# Patient Record
Sex: Female | Born: 1950 | Race: White | Hispanic: No | Marital: Married | Smoking: Never smoker
Health system: Southern US, Community
[De-identification: ages and names within clinical notes are randomized; demographics above are authoritative.]

## PROBLEM LIST (undated history)

## (undated) DIAGNOSIS — F419 Anxiety disorder, unspecified: Secondary | ICD-10-CM

## (undated) DIAGNOSIS — K219 Gastro-esophageal reflux disease without esophagitis: Secondary | ICD-10-CM

## (undated) DIAGNOSIS — D649 Anemia, unspecified: Secondary | ICD-10-CM

## (undated) DIAGNOSIS — K649 Unspecified hemorrhoids: Secondary | ICD-10-CM

## (undated) DIAGNOSIS — M8580 Other specified disorders of bone density and structure, unspecified site: Secondary | ICD-10-CM

## (undated) DIAGNOSIS — H269 Unspecified cataract: Secondary | ICD-10-CM

## (undated) DIAGNOSIS — E785 Hyperlipidemia, unspecified: Secondary | ICD-10-CM

## (undated) DIAGNOSIS — C801 Malignant (primary) neoplasm, unspecified: Secondary | ICD-10-CM

## (undated) DIAGNOSIS — F32A Depression, unspecified: Secondary | ICD-10-CM

## (undated) DIAGNOSIS — K2970 Gastritis, unspecified, without bleeding: Secondary | ICD-10-CM

## (undated) DIAGNOSIS — M1990 Unspecified osteoarthritis, unspecified site: Secondary | ICD-10-CM

## (undated) DIAGNOSIS — I10 Essential (primary) hypertension: Secondary | ICD-10-CM

## (undated) HISTORY — PX: CHOLECYSTECTOMY: SHX55

## (undated) HISTORY — DX: Gastro-esophageal reflux disease without esophagitis: K21.9

## (undated) HISTORY — PX: APPENDECTOMY: SHX54

## (undated) HISTORY — DX: Unspecified cataract: H26.9

## (undated) HISTORY — PX: DILATION AND CURETTAGE OF UTERUS: SHX78

## (undated) HISTORY — DX: Depression, unspecified: F32.A

## (undated) HISTORY — PX: CATARACT EXTRACTION W/ INTRAOCULAR LENS IMPLANT: SHX1309

## (undated) HISTORY — DX: Other specified disorders of bone density and structure, unspecified site: M85.80

## (undated) HISTORY — DX: Hyperlipidemia, unspecified: E78.5

---

## 1999-02-27 HISTORY — PX: ESOPHAGOGASTRODUODENOSCOPY: SHX1529

## 2001-05-23 ENCOUNTER — Ambulatory Visit (HOSPITAL_COMMUNITY): Admission: RE | Admit: 2001-05-23 | Discharge: 2001-05-23 | Payer: Self-pay | Admitting: *Deleted

## 2001-05-23 ENCOUNTER — Encounter: Payer: Self-pay | Admitting: *Deleted

## 2002-11-04 ENCOUNTER — Other Ambulatory Visit: Admission: RE | Admit: 2002-11-04 | Discharge: 2002-11-04 | Payer: Self-pay | Admitting: *Deleted

## 2003-12-06 ENCOUNTER — Other Ambulatory Visit: Admission: RE | Admit: 2003-12-06 | Discharge: 2003-12-06 | Payer: Self-pay | Admitting: Obstetrics and Gynecology

## 2010-07-19 ENCOUNTER — Encounter: Payer: Self-pay | Admitting: Gastroenterology

## 2010-07-19 HISTORY — PX: COLONOSCOPY: SHX174

## 2014-03-24 DIAGNOSIS — H40003 Preglaucoma, unspecified, bilateral: Secondary | ICD-10-CM | POA: Diagnosis not present

## 2014-05-20 DIAGNOSIS — Z1231 Encounter for screening mammogram for malignant neoplasm of breast: Secondary | ICD-10-CM | POA: Diagnosis not present

## 2014-05-20 DIAGNOSIS — Z6826 Body mass index (BMI) 26.0-26.9, adult: Secondary | ICD-10-CM | POA: Diagnosis not present

## 2014-05-20 DIAGNOSIS — Z01419 Encounter for gynecological examination (general) (routine) without abnormal findings: Secondary | ICD-10-CM | POA: Diagnosis not present

## 2014-05-20 DIAGNOSIS — Z124 Encounter for screening for malignant neoplasm of cervix: Secondary | ICD-10-CM | POA: Diagnosis not present

## 2014-06-25 DIAGNOSIS — M1711 Unilateral primary osteoarthritis, right knee: Secondary | ICD-10-CM | POA: Diagnosis not present

## 2014-06-25 DIAGNOSIS — M1712 Unilateral primary osteoarthritis, left knee: Secondary | ICD-10-CM | POA: Diagnosis not present

## 2014-09-07 DIAGNOSIS — Z01818 Encounter for other preprocedural examination: Secondary | ICD-10-CM | POA: Diagnosis not present

## 2014-09-07 DIAGNOSIS — Z9181 History of falling: Secondary | ICD-10-CM | POA: Diagnosis not present

## 2014-09-22 ENCOUNTER — Ambulatory Visit: Payer: Self-pay | Admitting: Orthopedic Surgery

## 2014-09-22 NOTE — Progress Notes (Signed)
Preoperative surgical orders have been place into the Epic hospital system for Brandy Cruz on 09/22/2014, 8:15 AM  by Patrica Duel for surgery on 10/18/2014.  Preop Total Knee orders including Experal, IV Tylenol, and IV Decadron as long as there are no contraindications to the above medications. Brandy Peace, PA-C

## 2014-09-27 DIAGNOSIS — D2239 Melanocytic nevi of other parts of face: Secondary | ICD-10-CM | POA: Diagnosis not present

## 2014-09-27 DIAGNOSIS — D485 Neoplasm of uncertain behavior of skin: Secondary | ICD-10-CM | POA: Diagnosis not present

## 2014-09-27 DIAGNOSIS — L728 Other follicular cysts of the skin and subcutaneous tissue: Secondary | ICD-10-CM | POA: Diagnosis not present

## 2014-09-28 ENCOUNTER — Ambulatory Visit: Payer: Self-pay | Admitting: Orthopedic Surgery

## 2014-09-28 NOTE — H&P (Signed)
Brandy Cruz DOB: [birth year] Married / Language: English / Race: White Female Date of Admission:  10/18/2014 CC:  Left Knee Pain History of Present Illness The patient is a 64 year old female who comes in for a preoperative History and Physical. The patient is scheduled for a left total knee arthroplasty to be performed by Dr. Gus Rankin. Aluisio, MD at [facility] on 10/18/2014. The patient is a 64 year old female who presents with knee complaints. The patient was seen for a second opinion. The patient reports left knee (worse than right) symptoms including: pain, swelling, stiffness (more on the left) and grinding which began year(s) ago without any known injury.The patient feels that the symptoms are worsening. The patient has the current diagnosis of knee osteoarthritis. Prior to being seen the patient was previously evaluated by a colleague. Previous work-up for this problem has included knee x-rays. Past treatment for this problem has included intra-articular injection of corticosteroids (last injections 04/20/14. She also had a round of Supartz injections about a year ago, but did not really get any relief from those). Current treatment includes application of ice, nonsteroidal anti-inflammatory drugs (Advil) and non-opioid analgesics (Tylenol, or topical analgesics). Unfortunately, her left knee is getting far worse than the right. She states it is bothering her at all times. It is something which she can and cannot do. She has had cortisone and viscosupplements without benefit. She was seen for a second opinion as to what further we can do with her knee to get her better. She does have some trouble with the right knee but nowhere near as bad as the left. She would like to get the left knee replaced at this time. They have been treated conservatively in the past for the above stated problem and despite conservative measures, they continue to have progressive pain and severe functional  limitations and dysfunction. They have failed non-operative management including home exercise, medications, and injections. It is felt that they would benefit from undergoing total joint replacement. Risks and benefits of the procedure have been discussed with the patient and they elect to proceed with surgery. There are no active contraindications to surgery such as ongoing infection or rapidly progressive neurological disease.  Problem List/Past Medical  Primary osteoarthritis of right knee (M17.11) High blood pressure Osteoarthritis Hyperlipidemia Mild Impaired Vision wears contacts Hemorrhoids occasional bleeding  Allergies Sulfabenzamide *CHEMICALS* Rash. Penicillamine *Assorted Classes** Rash. Childhood RXN  Family History Chronic Obstructive Lung Disease mother Congestive Heart Failure grandmother fathers side Heart Disease grandmother mothers side Cerebrovascular Accident grandfather fathers side Cancer mother and father Rheumatoid Arthritis mother Father Deceased, Prostate Cancer. age 26 Mother Deceased, Lung Cancer. 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 Ibuprofen (200MG  Tablet, Oral as needed) Active. Tylenol (Oral as needed) Specific dose unknown - Active. Juice Plus Fibre (Oral) Active.  Past Surgical History Appendectomy Date: 2009. Gallbladder Surgery Date: 2013. laporoscopic  Review of Systems  Constitutional: Negative.    HENT: Negative.   Cardiovascular: Negative.   Gastrointestinal: Negative.   Musculoskeletal: Positive for joint pain.  Neurological: Negative.    Vitals  Weight: 150 lb Height: 64in Weight was reported by patient. Height was reported by patient. Body Surface Area: 1.73 m Body Mass Index: 25.75 kg/m  BP: 148/78 (Sitting, Right Arm, Standard)  Physical Exam  General Mental Status -Alert, cooperative and good historian. General Appearance-pleasant, Not in acute distress. Orientation-Oriented X3. Build & Nutrition-Well nourished and Well developed.  Head and Neck Head-normocephalic, atraumatic . Neck Global Assessment - supple, no bruit auscultated on the right, no bruit auscultated on the left.  Eye Vision-Wears contact lenses. Pupil - Bilateral-Regular and Round. Motion - Bilateral-EOMI.  Chest and Lung Exam Auscultation Breath sounds - clear at anterior chest wall and clear at posterior chest wall. Adventitious sounds - No Adventitious sounds.  Cardiovascular Auscultation Rhythm - Regular rate and rhythm. Heart Sounds - S1 WNL and S2 WNL. Murmurs & Other Heart Sounds - Auscultation of the heart reveals - No Murmurs.  Abdomen Palpation/Percussion Tenderness - Abdomen is non-tender to palpation. Rigidity (guarding) - Abdomen is soft. Auscultation Auscultation of the abdomen reveals - Bowel sounds normal.  Female Genitourinary Note: Not done, not pertinent to present illness  Musculoskeletal Note: A well-developed female, alert and oriented, in no apparent distress. Her hips show normal range of motion with no discomfort. Both knees show no effusion. There is marked crepitus on range of motion, both knees. She has tenderness medial greater than lateral with no instability noted. Pulses, sensation and motor are intact to both lower extremities.  RADIOGRAPHS AP and lateral, both knees, show moderate to advanced arthritic change, medial and  patellofemoral compartments of both knees with bone on bone changes and osteophyte formation. The left knee is worse than the right.  Assessment & Plan Primary osteoarthritis of left knee (M17.12) Primary osteoarthritis of right knee (M17.11) Note:Surgical Plans: Left Total Knee Replacement  Disposition: Home with husband  PCP: Dr. Desmond Dike - Patient has been seen preoperatively and felt to be stable for surgery. "Considered low risk for complications if her preop labs and EKG are wnl. Rec standard post op DVT prophylaxsis. Please send me copy of those labs and EKG results."  IV TXA  Anesthesia Issues: None  Signed electronically by Lauraine Rinne, III PA-C

## 2014-09-30 DIAGNOSIS — H40003 Preglaucoma, unspecified, bilateral: Secondary | ICD-10-CM | POA: Diagnosis not present

## 2014-09-30 DIAGNOSIS — H2513 Age-related nuclear cataract, bilateral: Secondary | ICD-10-CM | POA: Diagnosis not present

## 2014-10-10 NOTE — Patient Instructions (Signed)
Brandy Cruz  10/10/2014   Your procedure is scheduled on:  October 18, 2014  Report to [facility] Main  Entrance take [place]  elevators to 3rd floor to  Short Stay Center at  8:35 AM.  Call this number if you have problems the morning of surgery [phone]   Remember: ONLY 1 PERSON MAY GO WITH YOU TO SHORT STAY TO GET  READY MORNING OF YOUR SURGERY.  Do not eat food or drink liquids :After Midnight.     Take these medicines the morning of surgery with A SIP OF WATER: None                                You may not have any metal on your body including hair pins and              piercings  Do not wear jewelry, make-up, lotions, powders or perfumes, deodorant             Do not wear nail polish.  Do not shave  48 hours prior to surgery.              Do not bring valuables to the hospital. [name] IS NOT             RESPONSIBLE   FOR VALUABLES.  Contacts, dentures or bridgework may not be worn into surgery.  Leave suitcase in the car. After surgery it may be brought to your room.      Special Instructions:  Coughing and deep breathing exercises              Please read over the following fact sheets you were given: _____________________________________________________________________             [facility] - Preparing for Surgery Before surgery, you can play an important role.  Because skin is not sterile, your skin needs to be as free of germs as possible.  You can reduce the number of germs on your skin by washing with CHG (chlorahexidine gluconate) soap before surgery.  CHG is an antiseptic cleaner which kills germs and bonds with the skin to continue killing germs even after washing. Please DO NOT use if you have an allergy to CHG or antibacterial soaps.  If your skin becomes reddened/irritated stop using the CHG and inform your nurse when you arrive at Short Stay. Do not shave (including legs and underarms) for at least 48 hours prior to the first  CHG shower.  You may shave your face/neck. Please follow these instructions carefully:  1.  Shower with CHG Soap the night before surgery and the  morning of Surgery.  2.  If you choose to wash your hair, wash your hair first as usual with your  normal  shampoo.  3.  After you shampoo, rinse your hair and body thoroughly to remove the  shampoo.                           4.  Use CHG as you would any other liquid soap.  You can apply chg directly  to the skin and wash                       Gently with a scrungie or clean washcloth.  5.  Apply  the CHG Soap to your body ONLY FROM THE NECK DOWN.   Do not use on face/ open                           Wound or open sores. Avoid contact with eyes, ears mouth and genitals (private parts).                       Wash face,  Genitals (private parts) with your normal soap.             6.  Wash thoroughly, paying special attention to the area where your surgery  will be performed.  7.  Thoroughly rinse your body with warm water from the neck down.  8.  DO NOT shower/wash with your normal soap after using and rinsing off  the CHG Soap.                9.  Pat yourself dry with a clean towel.            10.  Wear clean pajamas.            11.  Place clean sheets on your bed the night of your first shower and do not  sleep with pets. Day of Surgery : Do not apply any lotions/deodorants the morning of surgery.  Please wear clean clothes to the hospital/surgery center.  FAILURE TO FOLLOW THESE INSTRUCTIONS MAY RESULT IN THE CANCELLATION OF YOUR SURGERY PATIENT SIGNATURE_________________________________  NURSE SIGNATURE__________________________________  ________________________________________________________________________  WHAT IS A BLOOD TRANSFUSION? Blood Transfusion Information  A transfusion is the replacement of blood or some of its parts. Blood is made up of multiple cells which provide different functions.  Red blood cells carry oxygen and are used  for blood loss replacement.  White blood cells fight against infection.  Platelets control bleeding.  Plasma helps clot blood.  Other blood products are available for specialized needs, such as hemophilia or other clotting disorders. BEFORE THE TRANSFUSION  Who gives blood for transfusions?   Healthy volunteers who are fully evaluated to make sure their blood is safe. This is blood bank blood. Transfusion therapy is the safest it has ever been in the practice of medicine. Before blood is taken from a donor, a complete history is taken to make sure that person has no history of diseases nor engages in risky social behavior (examples are intravenous drug use or sexual activity with multiple partners). The donor's travel history is screened to minimize risk of transmitting infections, such as malaria. The donated blood is tested for signs of infectious diseases, such as HIV and hepatitis. The blood is then tested to be sure it is compatible with you in order to minimize the chance of a transfusion reaction. If you or a relative donates blood, this is often done in anticipation of surgery and is not appropriate for emergency situations. It takes many days to process the donated blood. RISKS AND COMPLICATIONS Although transfusion therapy is very safe and saves many lives, the main dangers of transfusion include:  1. Getting an infectious disease. 2. Developing a transfusion reaction. This is an allergic reaction to something in the blood you were given. Every precaution is taken to prevent this. The decision to have a blood transfusion has been considered carefully by your caregiver before blood is given. Blood is not given unless the benefits outweigh the risks. AFTER THE TRANSFUSION  Right after receiving a blood  transfusion, you will usually feel much better and more energetic. This is especially true if your red blood cells have gotten low (anemic). The transfusion raises the level of the red  blood cells which carry oxygen, and this usually causes an energy increase.  The nurse administering the transfusion will monitor you carefully for complications. HOME CARE INSTRUCTIONS  No special instructions are needed after a transfusion. You may find your energy is better. Speak with your caregiver about any limitations on activity for underlying diseases you may have. SEEK MEDICAL CARE IF:   Your condition is not improving after your transfusion.  You develop redness or irritation at the intravenous (IV) site. SEEK IMMEDIATE MEDICAL CARE IF:  Any of the following symptoms occur over the next 12 hours:  Shaking chills.  You have a temperature by mouth above 102 F (38.9 C), not controlled by medicine.  Chest, back, or muscle pain.  People around you feel you are not acting correctly or are confused.  Shortness of breath or difficulty breathing.  Dizziness and fainting.  You get a rash or develop hives.  You have a decrease in urine output.  Your urine turns a dark color or changes to pink, red, or brown. Any of the following symptoms occur over the next 10 days:  You have a temperature by mouth above 102 F (38.9 C), not controlled by medicine.  Shortness of breath.  Weakness after normal activity.  The white part of the eye turns yellow (jaundice).  You have a decrease in the amount of urine or are urinating less often.  Your urine turns a dark color or changes to pink, red, or brown. Document Released: 02/10/2000 Document Revised: 05/07/2011 Document Reviewed: 09/29/2007 ExitCare Patient Information 2014 [place], [place].  _______________________________________________________________________  Incentive Spirometer  An incentive spirometer is a tool that can help keep your lungs clear and active. This tool measures how well you are filling your lungs with each breath. Taking long deep breaths may help reverse or decrease the chance of developing breathing  (pulmonary) problems (especially infection) following:  A long period of time when you are unable to move or be active. BEFORE THE PROCEDURE   If the spirometer includes an indicator to show your best effort, your nurse or respiratory therapist will set it to a desired goal.  If possible, sit up straight or lean slightly forward. Try not to slouch.  Hold the incentive spirometer in an upright position. INSTRUCTIONS FOR USE  3. Sit on the edge of your bed if possible, or sit up as far as you can in bed or on a chair. 4. Hold the incentive spirometer in an upright position. 5. Breathe out normally. 6. Place the mouthpiece in your mouth and seal your lips tightly around it. 7. Breathe in slowly and as deeply as possible, raising the piston or the ball toward the top of the column. 8. Hold your breath for 3-5 seconds or for as long as possible. Allow the piston or ball to fall to the bottom of the column. 9. Remove the mouthpiece from your mouth and breathe out normally. 10. Rest for a few seconds and repeat Steps 1 through 7 at least 10 times every 1-2 hours when you are awake. Take your time and take a few normal breaths between deep breaths. 11. The spirometer may include an indicator to show your best effort. Use the indicator as a goal to work toward during each repetition. 12. After each set of 10 deep  breaths, practice coughing to be sure your lungs are clear. If you have an incision (the cut made at the time of surgery), support your incision when coughing by placing a pillow or rolled up towels firmly against it. Once you are able to get out of bed, walk around indoors and cough well. You may stop using the incentive spirometer when instructed by your caregiver.  RISKS AND COMPLICATIONS  Take your time so you do not get dizzy or light-headed.  If you are in pain, you may need to take or ask for pain medication before doing incentive spirometry. It is harder to take a deep breath if you  are having pain. AFTER USE  Rest and breathe slowly and easily.  It can be helpful to keep track of a log of your progress. Your caregiver can provide you with a simple table to help with this. If you are using the spirometer at home, follow these instructions: [place] IF:   You are having difficultly using the spirometer.  You have trouble using the spirometer as often as instructed.  Your pain medication is not giving enough relief while using the spirometer.  You develop fever of 100.5 F (38.1 C) or higher. SEEK IMMEDIATE MEDICAL CARE IF:   You cough up bloody sputum that had not been present before.  You develop fever of 102 F (38.9 C) or greater.  You develop worsening pain at or near the incision site. MAKE SURE YOU:   Understand these instructions.  Will watch your condition.  Will get help right away if you are not doing well or get worse. Document Released: 06/25/2006 Document Revised: 05/07/2011 Document Reviewed: 08/26/2006 [facility] Patient Information 2014 [place], [place].   ________________________________________________________________________

## 2014-10-12 ENCOUNTER — Encounter (HOSPITAL_COMMUNITY): Payer: Self-pay

## 2014-10-12 ENCOUNTER — Encounter (HOSPITAL_COMMUNITY)
Admission: RE | Admit: 2014-10-12 | Discharge: 2014-10-12 | Disposition: A | Discharge status: Home / Self Care | Payer: Medicare PPO | Source: Ambulatory Visit | Attending: Orthopedic Surgery | Admitting: Orthopedic Surgery

## 2014-10-12 DIAGNOSIS — M179 Osteoarthritis of knee, unspecified: Secondary | ICD-10-CM | POA: Insufficient documentation

## 2014-10-12 DIAGNOSIS — Z01818 Encounter for other preprocedural examination: Secondary | ICD-10-CM | POA: Diagnosis not present

## 2014-10-12 HISTORY — DX: Anemia, unspecified: D64.9

## 2014-10-12 HISTORY — DX: Anxiety disorder, unspecified: F41.9

## 2014-10-12 HISTORY — DX: Unspecified osteoarthritis, unspecified site: M19.90

## 2014-10-12 HISTORY — DX: Unspecified hemorrhoids: K64.9

## 2014-10-12 HISTORY — DX: Essential (primary) hypertension: I10

## 2014-10-12 LAB — CBC
HCT: 40.6 % (ref 36.0–46.0)
Hemoglobin: 13.9 g/dL (ref 12.0–15.0)
MCH: 32.3 pg (ref 26.0–34.0)
MCHC: 34.2 g/dL (ref 30.0–36.0)
MCV: 94.2 fL (ref 78.0–100.0)
Platelets: 252 10*3/uL (ref 150–400)
RBC: 4.31 MIL/uL (ref 3.87–5.11)
RDW: 12.2 % (ref 11.5–15.5)
WBC: 6.9 10*3/uL (ref 4.0–10.5)

## 2014-10-12 LAB — COMPREHENSIVE METABOLIC PANEL
ALT: 15 U/L (ref 14–54)
AST: 19 U/L (ref 15–41)
Albumin: 4.4 g/dL (ref 3.5–5.0)
Alkaline Phosphatase: 64 U/L (ref 38–126)
Anion gap: 9 (ref 5–15)
BUN: 7 mg/dL (ref 6–20)
CO2: 30 mmol/L (ref 22–32)
Calcium: 9.6 mg/dL (ref 8.9–10.3)
Chloride: 101 mmol/L (ref 101–111)
Creatinine, Ser: 0.69 mg/dL (ref 0.44–1.00)
GFR calc Af Amer: >60 mL/min (ref >60)
GFR calc non Af Amer: >60 mL/min (ref >60)
Glucose, Bld: 96 mg/dL (ref 65–99)
Potassium: 3.3 mmol/L — ABNORMAL LOW (ref 3.5–5.1)
Sodium: 140 mmol/L (ref 135–145)
Total Bilirubin: 0.7 mg/dL (ref 0.3–1.2)
Total Protein: 7.4 g/dL (ref 6.5–8.1)

## 2014-10-12 LAB — URINALYSIS, ROUTINE W REFLEX MICROSCOPIC
Bilirubin Urine: NEGATIVE
Glucose, UA: NEGATIVE mg/dL
Hgb urine dipstick: NEGATIVE
Ketones, ur: NEGATIVE mg/dL
Leukocytes, UA: NEGATIVE
Nitrite: NEGATIVE
Protein, ur: NEGATIVE mg/dL
Specific Gravity, Urine: 1.004 — ABNORMAL LOW (ref 1.005–1.030)
Urobilinogen, UA: 0.2 mg/dL (ref 0.0–1.0)
pH: 6 (ref 5.0–8.0)

## 2014-10-12 LAB — PROTIME-INR
INR: 1.01 (ref 0.00–1.49)
Prothrombin Time: 13.5 seconds (ref 11.6–15.2)

## 2014-10-12 LAB — SURGICAL PCR SCREEN
MRSA, PCR: NEGATIVE
Staphylococcus aureus: NEGATIVE

## 2014-10-12 LAB — APTT: aPTT: 30 seconds (ref 24–37)

## 2014-10-12 LAB — ABO/RH: ABO/RH(D): O POS

## 2014-10-12 NOTE — Progress Notes (Signed)
10-12-14 - LOV - Dr. Sheria Lang (Fam[facility]Med.) & surgical clearance - in chart

## 2014-10-18 ENCOUNTER — Encounter (HOSPITAL_COMMUNITY): Payer: Self-pay | Admitting: *Deleted

## 2014-10-18 ENCOUNTER — Inpatient Hospital Stay (HOSPITAL_COMMUNITY): Payer: Medicare PPO | Admitting: Certified Registered"

## 2014-10-18 ENCOUNTER — Inpatient Hospital Stay (HOSPITAL_COMMUNITY)
Admission: RE | Admit: 2014-10-18 | Discharge: 2014-10-20 | DRG: 470 | Disposition: A | Discharge status: Home Health | Payer: Medicare PPO | Source: Ambulatory Visit | Attending: Orthopedic Surgery | Admitting: Orthopedic Surgery

## 2014-10-18 ENCOUNTER — Encounter (HOSPITAL_COMMUNITY)
Admission: RE | Disposition: A | Discharge status: Home Health | Payer: Self-pay | Source: Ambulatory Visit | Attending: Orthopedic Surgery

## 2014-10-18 DIAGNOSIS — M1711 Unilateral primary osteoarthritis, right knee: Secondary | ICD-10-CM

## 2014-10-18 DIAGNOSIS — M25562 Pain in left knee: Secondary | ICD-10-CM | POA: Diagnosis present

## 2014-10-18 DIAGNOSIS — M170 Bilateral primary osteoarthritis of knee: Secondary | ICD-10-CM | POA: Diagnosis not present

## 2014-10-18 DIAGNOSIS — M1710 Unilateral primary osteoarthritis, unspecified knee: Secondary | ICD-10-CM | POA: Diagnosis present

## 2014-10-18 DIAGNOSIS — Z01812 Encounter for preprocedural laboratory examination: Secondary | ICD-10-CM | POA: Diagnosis not present

## 2014-10-18 DIAGNOSIS — M179 Osteoarthritis of knee, unspecified: Secondary | ICD-10-CM | POA: Diagnosis present

## 2014-10-18 DIAGNOSIS — Z79899 Other long term (current) drug therapy: Secondary | ICD-10-CM | POA: Diagnosis not present

## 2014-10-18 DIAGNOSIS — I10 Essential (primary) hypertension: Secondary | ICD-10-CM | POA: Diagnosis not present

## 2014-10-18 DIAGNOSIS — E785 Hyperlipidemia, unspecified: Secondary | ICD-10-CM | POA: Diagnosis present

## 2014-10-18 DIAGNOSIS — M1712 Unilateral primary osteoarthritis, left knee: Secondary | ICD-10-CM | POA: Diagnosis not present

## 2014-10-18 DIAGNOSIS — Z8249 Family history of ischemic heart disease and other diseases of the circulatory system: Secondary | ICD-10-CM

## 2014-10-18 DIAGNOSIS — Z8261 Family history of arthritis: Secondary | ICD-10-CM

## 2014-10-18 HISTORY — DX: Unilateral primary osteoarthritis, right knee: M17.11

## 2014-10-18 HISTORY — PX: TOTAL KNEE ARTHROPLASTY: SHX125

## 2014-10-18 HISTORY — DX: Osteoarthritis of knee, unspecified: M17.9

## 2014-10-18 HISTORY — DX: Unilateral primary osteoarthritis, unspecified knee: M17.10

## 2014-10-18 LAB — TYPE AND SCREEN
ABO/RH(D): O POS
Antibody Screen: NEGATIVE

## 2014-10-18 SURGERY — ARTHROPLASTY, KNEE, TOTAL
Anesthesia: Spinal | Site: Knee | Laterality: Left

## 2014-10-18 MED ORDER — CEFAZOLIN SODIUM-DEXTROSE 2-3 GM-% IV SOLR
INTRAVENOUS | Status: AC
  Filled 2014-10-18: qty 50

## 2014-10-18 MED ORDER — MIDAZOLAM HCL 2 MG/2ML IJ SOLN
INTRAMUSCULAR | Status: AC
  Filled 2014-10-18: qty 4

## 2014-10-18 MED ORDER — MIDAZOLAM HCL 5 MG/5ML IJ SOLN
INTRAMUSCULAR | Status: DC | PRN
  Administered 2014-10-18: 2 mg via INTRAVENOUS

## 2014-10-18 MED ORDER — KCL IN DEXTROSE-NACL 20-5-0.9 MEQ/L-%-% IV SOLN
INTRAVENOUS | Status: DC
  Administered 2014-10-18: 16:00:00 via INTRAVENOUS
  Filled 2014-10-18 (×5): qty 1000

## 2014-10-18 MED ORDER — CHLORHEXIDINE GLUCONATE 4 % EX LIQD: 60.0000 mL | Freq: Once | CUTANEOUS | Status: DC

## 2014-10-18 MED ORDER — DEXAMETHASONE SODIUM PHOSPHATE 10 MG/ML IJ SOLN
10.0000 mg | Freq: Once | INTRAMUSCULAR | Status: AC
  Administered 2014-10-19: 10 mg via INTRAVENOUS
  Filled 2014-10-18: qty 1

## 2014-10-18 MED ORDER — ACETAMINOPHEN 10 MG/ML IV SOLN
1000.0000 mg | Freq: Once | INTRAVENOUS | Status: AC
  Administered 2014-10-18: 1000 mg via INTRAVENOUS
  Filled 2014-10-18: qty 100

## 2014-10-18 MED ORDER — BUPIVACAINE LIPOSOME 1.3 % IJ SUSP
INTRAMUSCULAR | Status: DC | PRN
  Administered 2014-10-18: 20 mL

## 2014-10-18 MED ORDER — DOCUSATE SODIUM 100 MG PO CAPS
100.0000 mg | ORAL_CAPSULE | Freq: Two times a day (BID) | ORAL | Status: DC
  Administered 2014-10-18 – 2014-10-20 (×4): 100 mg via ORAL

## 2014-10-18 MED ORDER — DIPHENHYDRAMINE HCL 12.5 MG/5ML PO ELIX: 12.5000 mg | ORAL_SOLUTION | ORAL | Status: DC | PRN

## 2014-10-18 MED ORDER — LACTATED RINGERS IV SOLN
INTRAVENOUS | Status: AC
  Administered 2014-10-18: 1000 mL via INTRAVENOUS
  Administered 2014-10-18: 13:00:00 via INTRAVENOUS

## 2014-10-18 MED ORDER — OXYCODONE HCL 5 MG PO TABS
5.0000 mg | ORAL_TABLET | ORAL | Status: DC | PRN
  Administered 2014-10-18 – 2014-10-20 (×8): 10 mg via ORAL
  Filled 2014-10-18 (×8): qty 2

## 2014-10-18 MED ORDER — FENTANYL CITRATE (PF) 100 MCG/2ML IJ SOLN
INTRAMUSCULAR | Status: AC
  Filled 2014-10-18: qty 4

## 2014-10-18 MED ORDER — TRAMADOL HCL 50 MG PO TABS
50.0000 mg | ORAL_TABLET | Freq: Four times a day (QID) | ORAL | Status: DC | PRN
  Administered 2014-10-19: 50 mg via ORAL
  Filled 2014-10-18: qty 1

## 2014-10-18 MED ORDER — ONDANSETRON HCL 4 MG/2ML IJ SOLN
INTRAMUSCULAR | Status: DC | PRN
  Administered 2014-10-18: 4 mg via INTRAVENOUS

## 2014-10-18 MED ORDER — TRANEXAMIC ACID 1000 MG/10ML IV SOLN
1000.0000 mg | INTRAVENOUS | Status: AC
  Administered 2014-10-18: 1000 mg via INTRAVENOUS
  Filled 2014-10-18: qty 10

## 2014-10-18 MED ORDER — LIDOCAINE HCL (CARDIAC) 20 MG/ML IV SOLN
INTRAVENOUS | Status: AC
  Filled 2014-10-18: qty 5

## 2014-10-18 MED ORDER — METHOCARBAMOL 1000 MG/10ML IJ SOLN
500.0000 mg | Freq: Four times a day (QID) | INTRAVENOUS | Status: DC | PRN
  Administered 2014-10-19: 500 mg via INTRAVENOUS
  Filled 2014-10-18 (×2): qty 5

## 2014-10-18 MED ORDER — DEXAMETHASONE SODIUM PHOSPHATE 10 MG/ML IJ SOLN
INTRAMUSCULAR | Status: AC
Start: 2014-10-18 — End: 2014-10-18
  Filled 2014-10-18: qty 1

## 2014-10-18 MED ORDER — RIVAROXABAN 10 MG PO TABS
10.0000 mg | ORAL_TABLET | Freq: Every day | ORAL | Status: DC
Start: 2014-10-19 — End: 2014-10-20
  Administered 2014-10-19 – 2014-10-20 (×2): 10 mg via ORAL
  Filled 2014-10-18 (×3): qty 1

## 2014-10-18 MED ORDER — SODIUM CHLORIDE 0.9 % IV SOLN: INTRAVENOUS | Status: DC

## 2014-10-18 MED ORDER — ONDANSETRON HCL 4 MG/2ML IJ SOLN: 4.0000 mg | Freq: Four times a day (QID) | INTRAMUSCULAR | Status: DC | PRN

## 2014-10-18 MED ORDER — MORPHINE SULFATE (PF) 2 MG/ML IV SOLN
1.0000 mg | INTRAVENOUS | Status: DC | PRN
  Administered 2014-10-18: 2 mg via INTRAVENOUS
  Administered 2014-10-19: 1 mg via INTRAVENOUS
  Filled 2014-10-18 (×2): qty 1

## 2014-10-18 MED ORDER — POLYETHYLENE GLYCOL 3350 17 G PO PACK: 17.0000 g | PACK | Freq: Every day | ORAL | Status: DC | PRN

## 2014-10-18 MED ORDER — BISACODYL 10 MG RE SUPP: 10.0000 mg | Freq: Every day | RECTAL | Status: DC | PRN

## 2014-10-18 MED ORDER — PROPOFOL 10 MG/ML IV BOLUS
INTRAVENOUS | Status: AC
  Filled 2014-10-18: qty 20

## 2014-10-18 MED ORDER — PROPOFOL INFUSION 10 MG/ML OPTIME
INTRAVENOUS | Status: DC | PRN
  Administered 2014-10-18: 100 ug/kg/min via INTRAVENOUS

## 2014-10-18 MED ORDER — VANCOMYCIN HCL IN DEXTROSE 1-5 GM/200ML-% IV SOLN
1000.0000 mg | Freq: Two times a day (BID) | INTRAVENOUS | Status: AC
  Administered 2014-10-18: 1000 mg via INTRAVENOUS
  Filled 2014-10-18: qty 200

## 2014-10-18 MED ORDER — HYDROCHLOROTHIAZIDE 12.5 MG PO CAPS
12.5000 mg | ORAL_CAPSULE | Freq: Every day | ORAL | Status: DC
  Administered 2014-10-18 – 2014-10-20 (×3): 12.5 mg via ORAL
  Filled 2014-10-18 (×3): qty 1

## 2014-10-18 MED ORDER — SODIUM CHLORIDE 0.9 % IJ SOLN
INTRAMUSCULAR | Status: AC
  Filled 2014-10-18: qty 50

## 2014-10-18 MED ORDER — DEXAMETHASONE SODIUM PHOSPHATE 10 MG/ML IJ SOLN
10.0000 mg | Freq: Once | INTRAMUSCULAR | Status: AC
  Administered 2014-10-18: 10 mg via INTRAVENOUS

## 2014-10-18 MED ORDER — BUPIVACAINE IN DEXTROSE 0.75-8.25 % IT SOLN
INTRATHECAL | Status: DC | PRN
  Administered 2014-10-18: 1.6 mL via INTRATHECAL

## 2014-10-18 MED ORDER — ACETAMINOPHEN 325 MG PO TABS: 650.0000 mg | ORAL_TABLET | Freq: Four times a day (QID) | ORAL | Status: DC | PRN

## 2014-10-18 MED ORDER — FLEET ENEMA 7-19 GM/118ML RE ENEM: 1.0000 | ENEMA | Freq: Once | RECTAL | Status: DC | PRN

## 2014-10-18 MED ORDER — PROPOFOL 10 MG/ML IV BOLUS
INTRAVENOUS | Status: DC | PRN
  Administered 2014-10-18: 20 mg via INTRAVENOUS

## 2014-10-18 MED ORDER — KETOROLAC TROMETHAMINE 15 MG/ML IJ SOLN: 7.5000 mg | Freq: Four times a day (QID) | INTRAMUSCULAR | Status: AC | PRN

## 2014-10-18 MED ORDER — ACETAMINOPHEN 650 MG RE SUPP: 650.0000 mg | Freq: Four times a day (QID) | RECTAL | Status: DC | PRN

## 2014-10-18 MED ORDER — VALSARTAN-HYDROCHLOROTHIAZIDE 320-12.5 MG PO TABS: 1.0000 | ORAL_TABLET | Freq: Every morning | ORAL | Status: DC

## 2014-10-18 MED ORDER — ACETAMINOPHEN 10 MG/ML IV SOLN
INTRAVENOUS | Status: AC
  Filled 2014-10-18: qty 100

## 2014-10-18 MED ORDER — BUPIVACAINE HCL (PF) 0.25 % IJ SOLN
INTRAMUSCULAR | Status: AC
  Filled 2014-10-18: qty 30

## 2014-10-18 MED ORDER — PHENOL 1.4 % MT LIQD: 1.0000 | OROMUCOSAL | Status: DC | PRN

## 2014-10-18 MED ORDER — LIDOCAINE HCL (CARDIAC) 20 MG/ML IV SOLN
INTRAVENOUS | Status: DC | PRN
  Administered 2014-10-18: 20 mg via INTRAVENOUS

## 2014-10-18 MED ORDER — ACETAMINOPHEN 500 MG PO TABS
1000.0000 mg | ORAL_TABLET | Freq: Four times a day (QID) | ORAL | Status: AC
  Administered 2014-10-18 – 2014-10-19 (×4): 1000 mg via ORAL
  Filled 2014-10-18 (×5): qty 2

## 2014-10-18 MED ORDER — IRBESARTAN 300 MG PO TABS
300.0000 mg | ORAL_TABLET | Freq: Every day | ORAL | Status: DC
  Administered 2014-10-18 – 2014-10-20 (×3): 300 mg via ORAL
  Filled 2014-10-18 (×3): qty 1

## 2014-10-18 MED ORDER — ONDANSETRON HCL 4 MG PO TABS: 4.0000 mg | ORAL_TABLET | Freq: Four times a day (QID) | ORAL | Status: DC | PRN

## 2014-10-18 MED ORDER — ONDANSETRON HCL 4 MG/2ML IJ SOLN
INTRAMUSCULAR | Status: AC
  Filled 2014-10-18: qty 2

## 2014-10-18 MED ORDER — PHENYLEPHRINE HCL 10 MG/ML IJ SOLN
INTRAMUSCULAR | Status: DC | PRN
  Administered 2014-10-18: 80 ug via INTRAVENOUS

## 2014-10-18 MED ORDER — METOCLOPRAMIDE HCL 5 MG/ML IJ SOLN: 5.0000 mg | Freq: Three times a day (TID) | INTRAMUSCULAR | Status: DC | PRN

## 2014-10-18 MED ORDER — METOCLOPRAMIDE HCL 10 MG PO TABS: 5.0000 mg | ORAL_TABLET | Freq: Three times a day (TID) | ORAL | Status: DC | PRN

## 2014-10-18 MED ORDER — MENTHOL 3 MG MT LOZG: 1.0000 | LOZENGE | OROMUCOSAL | Status: DC | PRN

## 2014-10-18 MED ORDER — FENTANYL CITRATE (PF) 100 MCG/2ML IJ SOLN
INTRAMUSCULAR | Status: DC | PRN
  Administered 2014-10-18: 25 ug via INTRAVENOUS
  Administered 2014-10-18: 50 ug via INTRAVENOUS
  Administered 2014-10-18: 25 ug via INTRAVENOUS

## 2014-10-18 MED ORDER — SODIUM CHLORIDE 0.9 % IJ SOLN
INTRAMUSCULAR | Status: DC | PRN
  Administered 2014-10-18: 30 mL

## 2014-10-18 MED ORDER — BUPIVACAINE LIPOSOME 1.3 % IJ SUSP
20.0000 mL | Freq: Once | INTRAMUSCULAR | Status: DC
  Filled 2014-10-18: qty 20

## 2014-10-18 MED ORDER — CEFAZOLIN SODIUM-DEXTROSE 2-3 GM-% IV SOLR
2.0000 g | INTRAVENOUS | Status: AC
  Administered 2014-10-18: 2 g via INTRAVENOUS

## 2014-10-18 MED ORDER — METHOCARBAMOL 500 MG PO TABS
500.0000 mg | ORAL_TABLET | Freq: Four times a day (QID) | ORAL | Status: DC | PRN
  Administered 2014-10-18 – 2014-10-20 (×3): 500 mg via ORAL
  Filled 2014-10-18 (×3): qty 1

## 2014-10-18 MED ORDER — BUPIVACAINE HCL 0.25 % IJ SOLN
INTRAMUSCULAR | Status: DC | PRN
  Administered 2014-10-18: 30 mL

## 2014-10-18 SURGICAL SUPPLY — 64 items
BAG DECANTER FOR FLEXI CONT (MISCELLANEOUS) ×1 IMPLANT
BAG SPEC THK2 15X12 ZIP CLS (MISCELLANEOUS) ×1
BAG ZIPLOCK 12X15 (MISCELLANEOUS) ×2 IMPLANT
BANDAGE ELASTIC 6 VELCRO ST LF (GAUZE/BANDAGES/DRESSINGS) ×2 IMPLANT
BANDAGE ESMARK 6X9 LF (GAUZE/BANDAGES/DRESSINGS) ×1 IMPLANT
BLADE SAG 18X100X1.27 (BLADE) ×2 IMPLANT
BLADE SAW SGTL 11.0X1.19X90.0M (BLADE) ×2 IMPLANT
BNDG CMPR 9X6 STRL LF SNTH (GAUZE/BANDAGES/DRESSINGS) ×1
BNDG ESMARK 6X9 LF (GAUZE/BANDAGES/DRESSINGS) ×2
BOWL SMART MIX CTS (DISPOSABLE) ×2 IMPLANT
CAPT KNEE TOTAL 3 ATTUNE ×1 IMPLANT
CEMENT HV SMART SET (Cement) ×3 IMPLANT
CUFF TOURN SGL QUICK 34 (TOURNIQUET CUFF) ×2
CUFF TRNQT CYL 34X4X40X1 (TOURNIQUET CUFF) ×1 IMPLANT
DECANTER SPIKE VIAL GLASS SM (MISCELLANEOUS) ×2 IMPLANT
DRAPE EXTREMITY T 121X128X90 (DRAPE) ×2 IMPLANT
DRAPE POUCH INSTRU U-SHP 10X18 (DRAPES) ×2 IMPLANT
DRAPE U-SHAPE 47X51 STRL (DRAPES) ×2 IMPLANT
DRSG ADAPTIC 3X8 NADH LF (GAUZE/BANDAGES/DRESSINGS) ×2 IMPLANT
DRSG PAD ABDOMINAL 8X10 ST (GAUZE/BANDAGES/DRESSINGS) ×2 IMPLANT
DURAPREP 26ML APPLICATOR (WOUND CARE) ×2 IMPLANT
ELECT REM PT RETURN 9FT ADLT (ELECTROSURGICAL) ×2
ELECTRODE REM PT RTRN 9FT ADLT (ELECTROSURGICAL) ×1 IMPLANT
EVACUATOR 1/8 PVC DRAIN (DRAIN) ×2 IMPLANT
FACESHIELD WRAPAROUND (MASK) ×8 IMPLANT
FACESHIELD WRAPAROUND OR TEAM (MASK) ×5 IMPLANT
GAUZE SPONGE 4X4 12PLY STRL (GAUZE/BANDAGES/DRESSINGS) ×2 IMPLANT
GLOVE BIO SURGEON STRL SZ7.5 (GLOVE) IMPLANT
GLOVE BIO SURGEON STRL SZ8 (GLOVE) ×2 IMPLANT
GLOVE BIOGEL PI IND STRL 6.5 (GLOVE) IMPLANT
GLOVE BIOGEL PI IND STRL 8 (GLOVE) ×1 IMPLANT
GLOVE BIOGEL PI INDICATOR 6.5 (GLOVE)
GLOVE BIOGEL PI INDICATOR 8 (GLOVE) ×1
GLOVE SURG SS PI 6.5 STRL IVOR (GLOVE) ×1 IMPLANT
GOWN STRL REUS W/TWL LRG LVL3 (GOWN DISPOSABLE) ×2 IMPLANT
GOWN STRL REUS W/TWL XL LVL3 (GOWN DISPOSABLE) IMPLANT
HANDPIECE INTERPULSE COAX TIP (DISPOSABLE) ×2
IMMOBILIZER KNEE 20 (SOFTGOODS) ×2
IMMOBILIZER KNEE 20 THIGH 36 (SOFTGOODS) ×1 IMPLANT
KIT BASIN OR (CUSTOM PROCEDURE TRAY) ×2 IMPLANT
MANIFOLD NEPTUNE II (INSTRUMENTS) ×2 IMPLANT
NDL SAFETY ECLIPSE 18X1.5 (NEEDLE) ×2 IMPLANT
NEEDLE HYPO 18GX1.5 SHARP (NEEDLE) ×4
NS IRRIG 1000ML POUR BTL (IV SOLUTION) ×2 IMPLANT
PACK TOTAL JOINT (CUSTOM PROCEDURE TRAY) ×2 IMPLANT
PADDING CAST COTTON 6X4 STRL (CAST SUPPLIES) ×4 IMPLANT
PEN SKIN MARKING BROAD (MISCELLANEOUS) ×2 IMPLANT
POSITIONER SURGICAL ARM (MISCELLANEOUS) ×2 IMPLANT
SET HNDPC FAN SPRY TIP SCT (DISPOSABLE) ×1 IMPLANT
STRIP CLOSURE SKIN 1/2X4 (GAUZE/BANDAGES/DRESSINGS) ×3 IMPLANT
SUCTION FRAZIER 12FR DISP (SUCTIONS) ×2 IMPLANT
SUT MNCRL AB 4-0 PS2 18 (SUTURE) ×2 IMPLANT
SUT VIC AB 2-0 CT1 27 (SUTURE) ×6
SUT VIC AB 2-0 CT1 TAPERPNT 27 (SUTURE) ×3 IMPLANT
SUT VLOC 180 0 24IN GS25 (SUTURE) ×2 IMPLANT
SYR 20CC LL (SYRINGE) ×2 IMPLANT
SYR 50ML LL SCALE MARK (SYRINGE) ×2 IMPLANT
TOWEL OR 17X26 10 PK STRL BLUE (TOWEL DISPOSABLE) ×2 IMPLANT
TOWEL OR NON WOVEN STRL DISP B (DISPOSABLE) ×1 IMPLANT
TRAY FOLEY W/METER SILVER 14FR (SET/KITS/TRAYS/PACK) ×2 IMPLANT
TRAY FOLEY W/METER SILVER 16FR (SET/KITS/TRAYS/PACK) ×1 IMPLANT
WATER STERILE IRR 1500ML POUR (IV SOLUTION) ×2 IMPLANT
WRAP KNEE MAXI GEL POST OP (GAUZE/BANDAGES/DRESSINGS) ×2 IMPLANT
YANKAUER SUCT BULB TIP 10FT TU (MISCELLANEOUS) ×2 IMPLANT

## 2014-10-18 NOTE — Anesthesia Postprocedure Evaluation (Signed)
  Anesthesia Post-op Note  Patient: Brandy Cruz  Procedure(s) Performed: Procedure(s) (LRB): LEFT TOTAL KNEE ARTHROPLASTY (Left)  Patient Location: PACU  Anesthesia Type: Spinal  Level of Consciousness: awake and alert   Airway and Oxygen Therapy: Patient Spontanous Breathing  Post-op Pain: mild  Post-op Assessment: Post-op Vital signs reviewed, Patient's Cardiovascular Status Stable, Respiratory Function Stable, Patent Airway and No signs of Nausea or vomiting  Last Vitals:  Filed Vitals:   10/18/14 1415  BP:   Pulse: 72  Temp:   Resp: 16    Post-op Vital Signs: stable   Complications: No apparent anesthesia complications

## 2014-10-18 NOTE — Progress Notes (Signed)
Utilization review completed.  

## 2014-10-18 NOTE — H&P (View-Only) (Signed)
Brandy Cruz DOB: [birth year] Married / Language: English / Race: White Female Date of Admission:  10/18/2014 CC:  Left Knee Pain History of Present Illness The patient is a 64 year old female who comes in for a preoperative History and Physical. The patient is scheduled for a left total knee arthroplasty to be performed by Dr. Gus Rankin. Aluisio, MD at [facility] on 10/18/2014. The patient is a 64 year old female who presents with knee complaints. The patient was seen for a second opinion. The patient reports left knee (worse than right) symptoms including: pain, swelling, stiffness (more on the left) and grinding which began year(s) ago without any known injury.The patient feels that the symptoms are worsening. The patient has the current diagnosis of knee osteoarthritis. Prior to being seen the patient was previously evaluated by a colleague. Previous work-up for this problem has included knee x-rays. Past treatment for this problem has included intra-articular injection of corticosteroids (last injections 04/20/14. She also had a round of Supartz injections about a year ago, but did not really get any relief from those). Current treatment includes application of ice, nonsteroidal anti-inflammatory drugs (Advil) and non-opioid analgesics (Tylenol, or topical analgesics). Unfortunately, her left knee is getting far worse than the right. She states it is bothering her at all times. It is something which she can and cannot do. She has had cortisone and viscosupplements without benefit. She was seen for a second opinion as to what further we can do with her knee to get her better. She does have some trouble with the right knee but nowhere near as bad as the left. She would like to get the left knee replaced at this time. They have been treated conservatively in the past for the above stated problem and despite conservative measures, they continue to have progressive pain and severe functional  limitations and dysfunction. They have failed non-operative management including home exercise, medications, and injections. It is felt that they would benefit from undergoing total joint replacement. Risks and benefits of the procedure have been discussed with the patient and they elect to proceed with surgery. There are no active contraindications to surgery such as ongoing infection or rapidly progressive neurological disease.  Problem List/Past Medical  Primary osteoarthritis of right knee (M17.11) High blood pressure Osteoarthritis Hyperlipidemia Mild Impaired Vision wears contacts Hemorrhoids occasional bleeding  Allergies Sulfabenzamide *CHEMICALS* Rash. Penicillamine *Assorted Classes** Rash. Childhood RXN  Family History Chronic Obstructive Lung Disease mother Congestive Heart Failure grandmother fathers side Heart Disease grandmother mothers side Cerebrovascular Accident grandfather fathers side Cancer mother and father Rheumatoid Arthritis mother Father Deceased, Prostate Cancer. age 26 Mother Deceased, Lung Cancer. 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 Ibuprofen (200MG  Tablet, Oral as needed) Active. Tylenol (Oral as needed) Specific dose unknown - Active. Juice Plus Fibre (Oral) Active.  Past Surgical History Appendectomy Date: 2009. Gallbladder Surgery Date: 2013. laporoscopic  Review of Systems  Constitutional: Negative.    HENT: Negative.   Cardiovascular: Negative.   Gastrointestinal: Negative.   Musculoskeletal: Positive for joint pain.  Neurological: Negative.    Vitals  Weight: 150 lb Height: 64in Weight was reported by patient. Height was reported by patient. Body Surface Area: 1.73 m Body Mass Index: 25.75 kg/m  BP: 148/78 (Sitting, Right Arm, Standard)  Physical Exam  General Mental Status -Alert, cooperative and good historian. General Appearance-pleasant, Not in acute distress. Orientation-Oriented X3. Build & Nutrition-Well nourished and Well developed.  Head and Neck Head-normocephalic, atraumatic . Neck Global Assessment - supple, no bruit auscultated on the right, no bruit auscultated on the left.  Eye Vision-Wears contact lenses. Pupil - Bilateral-Regular and Round. Motion - Bilateral-EOMI.  Chest and Lung Exam Auscultation Breath sounds - clear at anterior chest wall and clear at posterior chest wall. Adventitious sounds - No Adventitious sounds.  Cardiovascular Auscultation Rhythm - Regular rate and rhythm. Heart Sounds - S1 WNL and S2 WNL. Murmurs & Other Heart Sounds - Auscultation of the heart reveals - No Murmurs.  Abdomen Palpation/Percussion Tenderness - Abdomen is non-tender to palpation. Rigidity (guarding) - Abdomen is soft. Auscultation Auscultation of the abdomen reveals - Bowel sounds normal.  Female Genitourinary Note: Not done, not pertinent to present illness  Musculoskeletal Note: A well-developed female, alert and oriented, in no apparent distress. Her hips show normal range of motion with no discomfort. Both knees show no effusion. There is marked crepitus on range of motion, both knees. She has tenderness medial greater than lateral with no instability noted. Pulses, sensation and motor are intact to both lower extremities.  RADIOGRAPHS AP and lateral, both knees, show moderate to advanced arthritic change, medial and  patellofemoral compartments of both knees with bone on bone changes and osteophyte formation. The left knee is worse than the right.  Assessment & Plan Primary osteoarthritis of left knee (M17.12) Primary osteoarthritis of right knee (M17.11) Note:Surgical Plans: Left Total Knee Replacement  Disposition: Home with husband  PCP: Dr. Desmond Dike - Patient has been seen preoperatively and felt to be stable for surgery. "Considered low risk for complications if her preop labs and EKG are wnl. Rec standard post op DVT prophylaxsis. Please send me copy of those labs and EKG results."  IV TXA  Anesthesia Issues: None  Signed electronically by Lauraine Rinne, III PA-C

## 2014-10-18 NOTE — Anesthesia Preprocedure Evaluation (Addendum)
Anesthesia Evaluation  Patient identified by MRN, date of birth, ID band Patient awake    Reviewed: Allergy & Precautions, NPO status , Patient's Chart, lab work & pertinent test results  Airway Mallampati: II  TM Distance: >3 FB Neck ROM: Full    Dental no notable dental hx.    Pulmonary neg pulmonary ROS,  breath sounds clear to auscultation  Pulmonary exam normal       Cardiovascular hypertension, Pt. on medications Normal cardiovascular examRhythm:Regular Rate:Normal     Neuro/Psych negative neurological ROS  negative psych ROS   GI/Hepatic negative GI ROS, Neg liver ROS,   Endo/Other  negative endocrine ROS  Renal/GU negative Renal ROS  negative genitourinary   Musculoskeletal negative musculoskeletal ROS (+)   Abdominal   Peds negative pediatric ROS (+)  Hematology negative hematology ROS (+)   Anesthesia Other Findings   Reproductive/Obstetrics negative OB ROS                             Anesthesia Physical Anesthesia Plan  ASA: II  Anesthesia Plan: Spinal   Post-op Pain Management:    Induction:   Airway Management Planned: Simple Face Mask  Additional Equipment:   Intra-op Plan:   Post-operative Plan:   Informed Consent: I have reviewed the patients History and Physical, chart, labs and discussed the procedure including the risks, benefits and alternatives for the proposed anesthesia with the patient or authorized representative who has indicated his/her understanding and acceptance.   Dental advisory given  Plan Discussed with: CRNA  Anesthesia Plan Comments:         Anesthesia Quick Evaluation  

## 2014-10-18 NOTE — Transfer of Care (Signed)
Immediate Anesthesia Transfer of Care Note  Patient: Brandy Cruz  Procedure(s) Performed: Procedure(s): LEFT TOTAL KNEE ARTHROPLASTY (Left)  Patient Location: PACU  Anesthesia Type:Spinal  Level of Consciousness:  sedated, patient cooperative and responds to stimulation  Airway & Oxygen Therapy:Patient Spontanous Breathing and Patient connected to face mask oxgen  Post-op Assessment:  Report given to PACU RN and Post -op Vital signs reviewed and stable  Post vital signs:  Reviewed and stable  Last Vitals:  Filed Vitals:   10/18/14 0832  BP: 151/76  Pulse: 67  Temp: 36.7 C  Resp: 16    Complications: No apparent anesthesia complications

## 2014-10-18 NOTE — Anesthesia Procedure Notes (Signed)
Spinal Patient location during procedure: OR End time: 10/18/2014 11:48 AM Staffing Anesthesiologist: Montez Hageman Resident/CRNA: Lajuana Carry E Performed by: anesthesiologist  Preanesthetic Checklist Completed: patient identified, site marked, surgical consent, pre-op evaluation, timeout performed, IV checked, risks and benefits discussed and monitors and equipment checked Spinal Block Patient position: sitting Prep: Betadine Patient monitoring: heart rate, continuous pulse ox and blood pressure Approach: right paramedian Location: L3-4 Injection technique: single-shot Needle Needle type: Sprotte  Needle gauge: 24 G Needle length: 10 cm Assessment Sensory level: T6 Additional Notes Expiration date of kit checked and confirmed. Attempt midline by CRNA w/o success, Dr. Marcell Barlow paramedian w/ Clear CSF pre/post injection, neg heme. Patient tolerated procedure well, without complications.

## 2014-10-18 NOTE — Plan of Care (Signed)
Problem: Phase I Progression Outcomes Goal: Initial discharge plan identified Outcome: Completed/Met Date Met:  10/18/14 Pt plans to go home with the help of her spouse.

## 2014-10-18 NOTE — Progress Notes (Signed)
PT Cancellation Note  Patient Details Name: Brandy Cruz MRN: [id number] DOB: 05/23/1950   Cancelled Treatment:    Reason Eval/Treat Not Completed: Patient not medically ready (still numb from epidural, see next day)   [facility] 10/18/2014, 4:31 PM

## 2014-10-18 NOTE — Interval H&P Note (Signed)
History and Physical Interval Note:  10/18/2014 10:43 AM  Brandy Cruz  has presented today for surgery, with the diagnosis of left knee osteoarthritis  The various methods of treatment have been discussed with the patient and family. After consideration of risks, benefits and other options for treatment, the patient has consented to  Procedure(s): LEFT TOTAL KNEE ARTHROPLASTY (Left) as a surgical intervention .  The patient's history has been reviewed, patient examined, no change in status, stable for surgery.  I have reviewed the patient's chart and labs.  Questions were answered to the patient's satisfaction.     Loanne Drilling

## 2014-10-18 NOTE — Op Note (Signed)
Pre-operative diagnosis- Osteoarthritis  Left knee(s)  Post-operative diagnosis- Osteoarthritis Left knee(s)  Procedure-  Left  Total Knee Arthroplasty  Surgeon- Gus Rankin. Korion Cuevas, MD  Assistant- Dimitri Ped, PA-C   Anesthesia-  Spinal  EBL-* No blood loss amount entered *   Drains Hemovac  Tourniquet time-  Total Tourniquet Time Documented: Calf (Left) - 31 minutes Total: Calf (Left) - 31 minutes     Complications- None  Condition-PACU - hemodynamically stable.   Brief Clinical Note  Brandy Cruz is a 64 y.o. year old female with end stage OA of her left knee with progressively worsening pain and dysfunction. She has constant pain, with activity and at rest and significant functional deficits with difficulties even with ADLs. She has had extensive non-op management including analgesics, injections of cortisone and viscosupplements, and home exercise program, but remains in significant pain with significant dysfunction. Radiographs show bone on bone arthritis medial and patellofemoral. She presents now for left Total Knee Arthroplasty.    Procedure in detail---   The patient is brought into the operating room and positioned supine on the operating table. After successful administration of  Spinal,   a tourniquet is placed high on the  Left thigh(s) and the lower extremity is prepped and draped in the usual sterile fashion. Time out is performed by the operating team and then the  Left lower extremity is wrapped in Esmarch, knee flexed and the tourniquet inflated to 300 mmHg.       A midline incision is made with a ten blade through the subcutaneous tissue to the level of the extensor mechanism. A fresh blade is used to make a medial parapatellar arthrotomy. Soft tissue over the proximal medial tibia is subperiosteally elevated to the joint line with a knife and into the semimembranosus bursa with a Cobb elevator. Soft tissue over the proximal lateral tibia is elevated with  attention being paid to avoiding the patellar tendon on the tibial tubercle. The patella is everted, knee flexed 90 degrees and the ACL and PCL are removed. Findings are bone on bone medial and patellofemoral with large global osteophytes.        The drill is used to create a starting hole in the distal femur and the canal is thoroughly irrigated with sterile saline to remove the fatty contents. The 5 degree Left  valgus alignment guide is placed into the femoral canal and the distal femoral cutting block is pinned to remove 9 mm off the distal femur. Resection is made with an oscillating saw.      The tibia is subluxed forward and the menisci are removed. The extramedullary alignment guide is placed referencing proximally at the medial aspect of the tibial tubercle and distally along the second metatarsal axis and tibial crest. The block is pinned to remove 2mm off the more deficient medial  side. Resection is made with an oscillating saw. Size 5is the most appropriate size for the tibia and the proximal tibia is prepared with the modular drill and keel punch for that size.      The femoral sizing guide is placed and size 5 is most appropriate. Rotation is marked off the epicondylar axis and confirmed by creating a rectangular flexion gap at 90 degrees. The size 5 cutting block is pinned in this rotation and the anterior, posterior and chamfer cuts are made with the oscillating saw. The intercondylar block is then placed and that cut is made.      Trial size 5 tibial component,  trial size 5 posterior stabilized femur and a 6  mm posterior stabilized rotating platform insert trial is placed. Full extension is achieved with excellent varus/valgus and anterior/posterior balance throughout full range of motion. The patella is everted and thickness measured to be 22  mm. Free hand resection is taken to 12 mm, a 35 template is placed, lug holes are drilled, trial patella is placed, and it tracks normally. Osteophytes  are removed off the posterior femur with the trial in place. All trials are removed and the cut bone surfaces prepared with pulsatile lavage. Cement is mixed and once ready for implantation, the size 5 tibial implant, size  5 posterior stabilized femoral component, and the size 35 patella are cemented in place and the patella is held with the clamp. The trial insert is placed and the knee held in full extension. The Exparel (20 ml mixed with 30 ml saline) and .25% Bupivicaine, are injected into the extensor mechanism, posterior capsule, medial and lateral gutters and subcutaneous tissues.  All extruded cement is removed and once the cement is hard the permanent 6 mm posterior stabilized rotating platform insert is placed into the tibial tray.      The wound is copiously irrigated with saline solution and the extensor mechanism closed over a hemovac drain with #1 V-loc suture. The tourniquet is released for a total tourniquet time of 31  minutes. Flexion against gravity is 140 degrees and the patella tracks normally. Subcutaneous tissue is closed with 2.0 vicryl and subcuticular with running 4.0 Monocryl. The incision is cleaned and dried and steri-strips and a bulky sterile dressing are applied. The limb is placed into a knee immobilizer and the patient is awakened and transported to recovery in stable condition.      Please note that a surgical assistant was a medical necessity for this procedure in order to perform it in a safe and expeditious manner. Surgical assistant was necessary to retract the ligaments and vital neurovascular structures to prevent injury to them and also necessary for proper positioning of the limb to allow for anatomic placement of the prosthesis.   Gus Rankin [place], MD    10/18/2014, 12:43 PM

## 2014-10-19 ENCOUNTER — Encounter (HOSPITAL_COMMUNITY): Payer: Self-pay | Admitting: Orthopedic Surgery

## 2014-10-19 LAB — CBC
HCT: 32.4 % — ABNORMAL LOW (ref 36.0–46.0)
Hemoglobin: 11.5 g/dL — ABNORMAL LOW (ref 12.0–15.0)
MCH: 33.4 pg (ref 26.0–34.0)
MCHC: 35.5 g/dL (ref 30.0–36.0)
MCV: 94.2 fL (ref 78.0–100.0)
Platelets: 244 10*3/uL (ref 150–400)
RBC: 3.44 MIL/uL — ABNORMAL LOW (ref 3.87–5.11)
RDW: 12.3 % (ref 11.5–15.5)
WBC: 14.1 10*3/uL — ABNORMAL HIGH (ref 4.0–10.5)

## 2014-10-19 LAB — BASIC METABOLIC PANEL
Anion gap: 8 (ref 5–15)
BUN: 6 mg/dL (ref 6–20)
CO2: 28 mmol/L (ref 22–32)
Calcium: 8.8 mg/dL — ABNORMAL LOW (ref 8.9–10.3)
Chloride: 100 mmol/L — ABNORMAL LOW (ref 101–111)
Creatinine, Ser: 0.6 mg/dL (ref 0.44–1.00)
GFR calc Af Amer: >60 mL/min (ref >60)
GFR calc non Af Amer: >60 mL/min (ref >60)
Glucose, Bld: 150 mg/dL — ABNORMAL HIGH (ref 65–99)
Potassium: 3.5 mmol/L (ref 3.5–5.1)
Sodium: 136 mmol/L (ref 135–145)

## 2014-10-19 MED ORDER — TRAMADOL HCL 50 MG PO TABS: 50.0000 mg | ORAL_TABLET | Freq: Four times a day (QID) | ORAL | Status: DC | PRN

## 2014-10-19 MED ORDER — RIVAROXABAN 10 MG PO TABS: 10.0000 mg | ORAL_TABLET | Freq: Every day | ORAL | Status: DC

## 2014-10-19 MED ORDER — OXYCODONE HCL 5 MG PO TABS: 5.0000 mg | ORAL_TABLET | ORAL | Status: DC | PRN

## 2014-10-19 MED ORDER — METHOCARBAMOL 500 MG PO TABS: 500.0000 mg | ORAL_TABLET | Freq: Four times a day (QID) | ORAL | Status: DC | PRN

## 2014-10-19 NOTE — Discharge Instructions (Addendum)
° °Dr. Frank Aluisio °Total Joint Specialist °[facility] °[address] °[place], [place] [address] °[phone] ° °TOTAL KNEE REPLACEMENT POSTOPERATIVE DIRECTIONS ° °Knee Rehabilitation, Guidelines Following Surgery  °Results after knee surgery are often greatly improved when you follow the exercise, range of motion and muscle strengthening exercises prescribed by your doctor. Safety measures are also important to protect the knee from further injury. Any time any of these exercises cause you to have increased pain or swelling in your knee joint, decrease the amount until you are comfortable again and slowly increase them. If you have problems or questions, call your caregiver or physical therapist for advice.  ° °HOME CARE INSTRUCTIONS  °Remove items at home which could result in a fall. This includes throw rugs or furniture in walking pathways.  °· ICE to the affected knee every three hours for 30 minutes at a time and then as needed for pain and swelling.  Continue to use ice on the knee for pain and swelling from surgery. You may notice swelling that will progress down to the foot and ankle.  This is normal after surgery.  Elevate the leg when you are not up walking on it.   °· Continue to use the breathing machine which will help keep your temperature down.  It is common for your temperature to cycle up and down following surgery, especially at night when you are not up moving around and exerting yourself.  The breathing machine keeps your lungs expanded and your temperature down. °· Do not place pillow under knee, focus on keeping the knee straight while resting ° °DIET °You may resume your previous home diet once your are discharged from the hospital. ° °DRESSING / WOUND CARE / SHOWERING °You may shower 3 days after surgery, but keep the wounds dry during showering.  You may use an occlusive plastic wrap (Press'n Seal for example), NO SOAKING/SUBMERGING IN THE BATHTUB.  If the  bandage gets wet, change with a clean dry gauze.  If the incision gets wet, pat the wound dry with a clean towel. °You may start showering once you are discharged home but do not submerge the incision under water. Just pat the incision dry and apply a dry gauze dressing on daily. °Change the surgical dressing daily and reapply a dry dressing each time. ° °ACTIVITY °Walk with your walker as instructed. °Use walker as long as suggested by your caregivers. °Avoid periods of inactivity such as sitting longer than an hour when not asleep. This helps prevent blood clots.  °You may resume a sexual relationship in one month or when given the OK by your doctor.  °You may return to work once you are cleared by your doctor.  °Do not drive a car for 6 weeks or until released by you surgeon.  °Do not drive while taking narcotics. ° °WEIGHT BEARING °Weight bearing as tolerated with assist device (walker, cane, etc) as directed, use it as long as suggested by your surgeon or therapist, typically at least 4-6 weeks. ° °POSTOPERATIVE CONSTIPATION PROTOCOL °Constipation - defined medically as fewer than three stools per week and severe constipation as less than one stool per week. ° °One of the most common issues patients have following surgery is constipation.  Even if you have a regular bowel pattern at home, your normal regimen is likely to be disrupted due to multiple reasons following surgery.  Combination of anesthesia, postoperative narcotics, change in appetite and fluid intake all can affect your bowels.    In order to avoid complications following surgery, here are some recommendations in order to help you during your recovery period. ° °Colace (docusate) - Pick up an over-the-counter form of Colace or another stool softener and take twice a day as long as you are requiring postoperative pain medications.  Take with a full glass of water daily.  If you experience loose stools or diarrhea, hold the colace until you stool forms  back up.  If your symptoms do not get better within 1 week or if they get worse, check with your doctor. ° °Dulcolax (bisacodyl) - Pick up over-the-counter and take as directed by the product packaging as needed to assist with the movement of your bowels.  Take with a full glass of water.  Use this product as needed if not relieved by Colace only.  ° °MiraLax (polyethylene glycol) - Pick up over-the-counter to have on hand.  MiraLax is a solution that will increase the amount of water in your bowels to assist with bowel movements.  Take as directed and can mix with a glass of water, juice, soda, coffee, or tea.  Take if you go more than two days without a movement. °Do not use MiraLax more than once per day. Call your doctor if you are still constipated or irregular after using this medication for 7 days in a row. ° °If you continue to have problems with postoperative constipation, please contact the office for further assistance and recommendations.  If you experience "the worst abdominal pain ever" or develop nausea or vomiting, please contact the office immediatly for further recommendations for treatment. ° °ITCHING ° If you experience itching with your medications, try taking only a single pain pill, or even half a pain pill at a time.  You can also use Benadryl over the counter for itching or also to help with sleep.  ° °TED HOSE STOCKINGS °Wear the elastic stockings on both legs for three weeks following surgery during the day but you may remove then at night for sleeping. ° °MEDICATIONS °See your medication summary on the “After Visit Summary” that the nursing staff will review with you prior to discharge.  You may have some home medications which will be placed on hold until you complete the course of blood thinner medication.  It is important for you to complete the blood thinner medication as prescribed by your surgeon.  Continue your approved medications as instructed at time of  discharge. ° °PRECAUTIONS °If you experience chest pain or shortness of breath - call 911 immediately for transfer to the hospital emergency department.  °If you develop a fever greater that 101 F, purulent drainage from wound, increased redness or drainage from wound, foul odor from the wound/dressing, or calf pain - CONTACT YOUR SURGEON.   °                                                °FOLLOW-UP APPOINTMENTS °Make sure you keep all of your appointments after your operation with your surgeon and caregivers. You should call the office at the above phone number and make an appointment for approximately two weeks after the date of your surgery or on the date instructed by your surgeon outlined in the "After Visit Summary". ° ° °RANGE OF MOTION AND STRENGTHENING EXERCISES  °Rehabilitation of the knee is important following a knee injury or   an operation. After just a few days of immobilization, the muscles of the thigh which control the knee become weakened and shrink (atrophy). Knee exercises are designed to build up the tone and strength of the thigh muscles and to improve knee motion. Often times heat used for twenty to thirty minutes before working out will loosen up your tissues and help with improving the range of motion but do not use heat for the first two weeks following surgery. These exercises can be done on a training (exercise) mat, on the floor, on a table or on a bed. Use what ever works the best and is most comfortable for you Knee exercises include:  °Leg Lifts - While your knee is still immobilized in a splint or cast, you can do straight leg raises. Lift the leg to 60 degrees, hold for 3 sec, and slowly lower the leg. Repeat 10-20 times 2-3 times daily. Perform this exercise against resistance later as your knee gets better.  °Quad and Hamstring Sets - Tighten up the muscle on the front of the thigh (Quad) and hold for 5-10 sec. Repeat this 10-20 times hourly. Hamstring sets are done by pushing the  foot backward against an object and holding for 5-10 sec. Repeat as with quad sets.  °· Leg Slides: Lying on your back, slowly slide your foot toward your buttocks, bending your knee up off the floor (only go as far as is comfortable). Then slowly slide your foot back down until your leg is flat on the floor again. °· Angel Wings: Lying on your back spread your legs to the side as far apart as you can without causing discomfort.  °A rehabilitation program following serious knee injuries can speed recovery and prevent re-injury in the future due to weakened muscles. Contact your doctor or a physical therapist for more information on knee rehabilitation.  ° °IF YOU ARE TRANSFERRED TO A SKILLED REHAB FACILITY °If the patient is transferred to a skilled rehab facility following release from the hospital, a list of the current medications will be sent to the facility for the patient to continue.  When discharged from the skilled rehab facility, please have the facility set up the patient's Home Health Physical Therapy prior to being released. Also, the skilled facility will be responsible for providing the patient with their medications at time of release from the facility to include their pain medication, the muscle relaxants, and their blood thinner medication. If the patient is still at the rehab facility at time of the two week follow up appointment, the skilled rehab facility will also need to assist the patient in arranging follow up appointment in our office and any transportation needs. ° °MAKE SURE YOU:  °Understand these instructions.  °Get help right away if you are not doing well or get worse.  ° ° °Pick up stool softner and laxative for home use following surgery while on pain medications. °Do not submerge incision under water. °Please use good hand washing techniques while changing dressing each day. °May shower starting three days after surgery. °Please use a clean towel to pat the incision dry following  showers. °Continue to use ice for pain and swelling after surgery. °Do not use any lotions or creams on the incision until instructed by your surgeon. ° °Take Xarelto for two and a half more weeks, then discontinue Xarelto. °Once the patient has completed the blood thinner regimen, then take a Baby 81 mg Aspirin daily for three more weeks. ° ° ° °  Information on my medicine - XARELTO® (Rivaroxaban) ° °This medication education was reviewed with me or my healthcare representative as part of my discharge preparation.  ° °Why was Xarelto® prescribed for you? °Xarelto® was prescribed for you to reduce the risk of blood clots forming after orthopedic surgery. The medical term for these abnormal blood clots is venous thromboembolism (VTE). ° °What do you need to know about xarelto® ? °Take your Xarelto® ONCE DAILY at the same time every day. °You may take it either with or without food. ° °If you have difficulty swallowing the tablet whole, you may crush it and mix in applesauce just prior to taking your dose. ° °Take Xarelto® exactly as prescribed by your doctor and DO NOT stop taking Xarelto® without talking to the doctor who prescribed the medication.  Stopping without other VTE prevention medication to take the place of Xarelto® may increase your risk of developing a clot. ° °After discharge, you should have regular check-up appointments with your healthcare provider that is prescribing your Xarelto®.   ° °What do you do if you miss a dose? °If you miss a dose, take it as soon as you remember on the same day then continue your regularly scheduled once daily regimen the next day. Do not take two doses of Xarelto® on the same day.  ° °Important Safety Information °A possible side effect of Xarelto® is bleeding. You should call your healthcare provider right away if you experience any of the following: °? Bleeding from an injury or your nose that does not stop. °? Unusual colored urine (red or dark brown) or unusual  colored stools (red or black). °? Unusual bruising for unknown reasons. °? A serious fall or if you hit your head (even if there is no bleeding). ° °Some medicines may interact with Xarelto® and might increase your risk of bleeding while on Xarelto®. To help avoid this, consult your healthcare provider or pharmacist prior to using any new prescription or non-prescription medications, including herbals, vitamins, non-steroidal anti-inflammatory drugs (NSAIDs) and supplements. ° °This website has more information on Xarelto®: www.xarelto.com. ° ° °

## 2014-10-19 NOTE — Evaluation (Signed)
Occupational Therapy Evaluation Patient Details Name: Brandy Cruz MRN: [id number] DOB: 1951-02-25 Today's Date: 10/19/2014    History of Present Illness L TKA   Clinical Impression   Practiced up to 3in1 with walker. Pt doing well and family is supportive. Will follow on acute to progress ADL independence.     Follow Up Recommendations  No OT follow up    Equipment Recommendations  3 in 1 bedside comode    Recommendations for Other Services       Precautions / Restrictions Precautions Precautions: Knee;Fall Required Braces or Orthoses: Knee Immobilizer - Left Knee Immobilizer - Left: Discontinue once straight leg raise with < 10 degree lag Restrictions Weight Bearing Restrictions: No      Mobility Bed Mobility      General bed mobility comments: in chair.  Transfers Overall transfer level: Needs assistance Equipment used: Rolling walker (2 wheeled) Transfers: Sit to/from Stand Sit to Stand: Min assist         General transfer comment: cues for hand placement.    Balance                                            ADL Overall ADL's : Needs assistance/impaired Eating/Feeding: Independent;Sitting   Grooming: Wash/dry hands;Minimal assistance;Standing   Upper Body Bathing: Set up;Sitting   Lower Body Bathing: Minimal assistance;Sit to/from stand   Upper Body Dressing : Set up;Sitting   Lower Body Dressing: Minimal assistance;Sit to/from stand   Toilet Transfer: Minimal assistance;Ambulation;BSC;RW   Toileting- Clothing Manipulation and Hygiene: Minimal assistance;Sit to/from stand         General ADL Comments: Pt has a high commode but states she does not have anything to help push up from commode. Practiced with 3in1 and pt would like a 3in1 ordered. Discussed tubseat options of chair versus bench and also sponge bathing initially and pt states she will likely sponge bathe initially. Discussed and educated on sequence for LB  dressing. Pt needs min cues for walker sequence and safety.      Vision     Perception     Praxis      Pertinent Vitals/Pain Pain Assessment: 0-10 Pain Score: 5  Pain Location: L knee Pain Descriptors / Indicators: Aching Pain Intervention(s): Repositioned;Ice applied     Hand Dominance     Extremity/Trunk Assessment Upper Extremity Assessment Upper Extremity Assessment: Overall WFL for tasks assessed          Communication Communication Communication: No difficulties   Cognition Arousal/Alertness: Awake/alert Behavior During Therapy: WFL for tasks assessed/performed Overall Cognitive Status: Within Functional Limits for tasks assessed                     General Comments       Exercises       Shoulder Instructions      Home Living Family/patient expects to be discharged to:: Private residence Living Arrangements: Spouse/significant other Available Help at Discharge: Family Type of Home: House Home Access: Stairs to enter Secretary/administrator of Steps: 1 high   Home Layout: One level     Bathroom Shower/Tub: Chief Strategy Officer: Handicapped height     Home Equipment: Environmental consultant - 4 wheels          Prior Functioning/Environment Level of Independence: Independent  OT Diagnosis: Generalized weakness   OT Problem List: Decreased strength;Decreased knowledge of use of DME or AE   OT Treatment/Interventions: Self-care/ADL training;Patient/family education;Therapeutic activities;DME and/or AE instruction    OT Goals(Current goals can be found in the care plan section) Acute Rehab OT Goals Patient Stated Goal: home OT Goal Formulation: With patient/family Time For Goal Achievement: 10/26/14 Potential to Achieve Goals: Good  OT Frequency: Min 2X/week   Barriers to D/C:            Co-evaluation              End of Session Equipment Utilized During Treatment: Gait belt;Rolling walker;Left knee  immobilizer CPM Left Knee CPM Left Knee: Off  Activity Tolerance: Patient tolerated treatment well Patient left: in chair;with call bell/phone within reach;with family/visitor present   Time: 4098-1191 OT Time Calculation (min): 23 min Charges:  OT General Charges $OT Visit: 1 Procedure OT Evaluation $Initial OT Evaluation Tier I: 1 Procedure OT Treatments $Therapeutic Activity: 8-22 mins G-Codes:    Lennox Laity  [phone] 10/19/2014, 12:18 PM

## 2014-10-19 NOTE — Progress Notes (Signed)
Physical Therapy Treatment Patient Details Name: Brandy Cruz MRN: [id number] DOB: 07/11/1950 Today's Date: 10/19/2014    History of Present Illness L TKA    PT Comments    Progressing well.  Follow Up Recommendations  Home health PT     Equipment Recommendations  Rolling walker with 5" wheels    Recommendations for Other Services       Precautions / Restrictions Precautions Precautions: Knee;Fall    Mobility  Bed Mobility   Bed Mobility: Sit to Supine       Sit to supine: Min assist   General bed mobility comments: L leg assist  Transfers   Equipment used: Rolling walker (2 wheeled) Transfers: Sit to/from Stand Sit to Stand: Supervision         General transfer comment: cues for hand placement.  Ambulation/Gait Ambulation/Gait assistance: Supervision Ambulation Distance (Feet): 85 Feet   Gait Pattern/deviations: Step-to pattern;Antalgic     General Gait Details: cues for sequence and posture   Stairs            Wheelchair Mobility    Modified Rankin (Stroke Patients Only)       Balance                                    Cognition Arousal/Alertness: Awake/alert                          Exercises      General Comments        Pertinent Vitals/Pain Pain Score: 6  Pain Location: L knee Pain Descriptors / Indicators: Aching Pain Intervention(s): Patient requesting pain meds-RN notified;Ice applied    Home Living                      Prior Function            PT Goals (current goals can now be found in the care plan section) Progress towards PT goals: Progressing toward goals    Frequency  7X/week    PT Plan Current plan remains appropriate    Co-evaluation             End of Session   Activity Tolerance: Patient tolerated treatment well Patient left: in bed;with call bell/phone within reach     Time: 1330-1352 PT Time Calculation (min) (ACUTE ONLY): 22 min  Charges:   $Gait Training: 8-22 mins                    G Codes:      Rada Hay 10/19/2014, 5:58 PM

## 2014-10-19 NOTE — Discharge Summary (Signed)
Physician Discharge Summary   Patient ID: Brandy Cruz MRN: [id number] DOB/AGE: 1950/04/28 64 y.o.  Admit date: 10/18/2014 Discharge date: 10/20/2014  Primary Diagnosis:  Osteoarthritis Left knee(s)  Admission Diagnoses:  Past Medical History  Diagnosis Date  . Hypertension   . Anxiety     hx of  . Arthritis   . Hemorrhoids   . Anemia     hx of   Discharge Diagnoses:   Principal Problem:   OA (osteoarthritis) of knee  Estimated body mass index is 25.73 kg/(m^2) as calculated from the following:   Height as of this encounter: _0  (1.626 m).   Weight as of this encounter: 68.04 kg (150 lb).  Procedure:  Procedure(s) (LRB): LEFT TOTAL KNEE ARTHROPLASTY (Left)   Consults: None  HPI: Brandy Cruz is a 64 y.o. year old female with end stage OA of her left knee with progressively worsening pain and dysfunction. She has constant pain, with activity and at rest and significant functional deficits with difficulties even with ADLs. She has had extensive non-op management including analgesics, injections of cortisone and viscosupplements, and home exercise program, but remains in significant pain with significant dysfunction. Radiographs show bone on bone arthritis medial and patellofemoral. She presents now for left Total Knee Arthroplasty.  Laboratory Data: Admission on 10/18/2014  Component Date Value Ref Range Status  . WBC 10/19/2014 14.1* 4.0 - 10.5 K/uL Final  . RBC 10/19/2014 3.44* 3.87 - 5.11 MIL/uL Final  . Hemoglobin 10/19/2014 11.5* 12.0 - 15.0 g/dL Final  . HCT 10/19/2014 32.4* 36.0 - 46.0 % Final  . MCV 10/19/2014 94.2  78.0 - 100.0 fL Final  . MCH 10/19/2014 33.4  26.0 - 34.0 pg Final  . MCHC 10/19/2014 35.5  30.0 - 36.0 g/dL Final  . RDW 10/19/2014 12.3  11.5 - 15.5 % Final  . Platelets 10/19/2014 244  150 - 400 K/uL Final  . Sodium 10/19/2014 136  135 - 145 mmol/L Final  . Potassium 10/19/2014 3.5  3.5 - 5.1 mmol/L Final  . Chloride 10/19/2014 100* 101 - 111  mmol/L Final  . CO2 10/19/2014 28  22 - 32 mmol/L Final  . Glucose, Bld 10/19/2014 150* 65 - 99 mg/dL Final  . BUN 10/19/2014 6  6 - 20 mg/dL Final  . Creatinine, Ser 10/19/2014 0.60  0.44 - 1.00 mg/dL Final  . Calcium 10/19/2014 8.8* 8.9 - 10.3 mg/dL Final  . GFR calc non Af Amer 10/19/2014 >60  >60 mL/min Final  . GFR calc Af Amer 10/19/2014 >60  >60 mL/min Final   Comment: (NOTE) The eGFR has been calculated using the CKD EPI equation. This calculation has not been validated in all clinical situations. eGFR's persistently <60 mL/min signify possible Chronic Kidney Disease.   Georgiann Hahn gap 10/19/2014 8  5 - 15 Final  Hospital Outpatient Visit on 10/12/2014  Component Date Value Ref Range Status  . MRSA, PCR 10/12/2014 NEGATIVE  NEGATIVE Final  . Staphylococcus aureus 10/12/2014 NEGATIVE  NEGATIVE Final   Comment:        The Xpert SA Assay (FDA approved for NASAL specimens in patients over 47 years of age), is one component of a comprehensive surveillance program.  Test performance has been validated by [facility] for patients greater than or equal to 68 year old. It is not intended to diagnose infection nor to guide or monitor treatment.   Marland Kitchen aPTT 10/12/2014 30  24 - 37 seconds Final  . WBC 10/12/2014 6.9  4.0 - 10.5 K/uL Final  . RBC 10/12/2014 4.31  3.87 - 5.11 MIL/uL Final  . Hemoglobin 10/12/2014 13.9  12.0 - 15.0 g/dL Final  . HCT 10/12/2014 40.6  36.0 - 46.0 % Final  . MCV 10/12/2014 94.2  78.0 - 100.0 fL Final  . MCH 10/12/2014 32.3  26.0 - 34.0 pg Final  . MCHC 10/12/2014 34.2  30.0 - 36.0 g/dL Final  . RDW 10/12/2014 12.2  11.5 - 15.5 % Final  . Platelets 10/12/2014 252  150 - 400 K/uL Final  . Sodium 10/12/2014 140  135 - 145 mmol/L Final  . Potassium 10/12/2014 3.3* 3.5 - 5.1 mmol/L Final  . Chloride 10/12/2014 101  101 - 111 mmol/L Final  . CO2 10/12/2014 30  22 - 32 mmol/L Final  . Glucose, Bld 10/12/2014 96  65 - 99 mg/dL Final  . BUN 10/12/2014 7  6 - 20  mg/dL Final  . Creatinine, Ser 10/12/2014 0.69  0.44 - 1.00 mg/dL Final  . Calcium 10/12/2014 9.6  8.9 - 10.3 mg/dL Final  . Total Protein 10/12/2014 7.4  6.5 - 8.1 g/dL Final  . Albumin 10/12/2014 4.4  3.5 - 5.0 g/dL Final  . AST 10/12/2014 19  15 - 41 U/L Final  . ALT 10/12/2014 15  14 - 54 U/L Final  . Alkaline Phosphatase 10/12/2014 64  38 - 126 U/L Final  . Total Bilirubin 10/12/2014 0.7  0.3 - 1.2 mg/dL Final  . GFR calc non Af Amer 10/12/2014 >60  >60 mL/min Final  . GFR calc Af Amer 10/12/2014 >60  >60 mL/min Final   Comment: (NOTE) The eGFR has been calculated using the CKD EPI equation. This calculation has not been validated in all clinical situations. eGFR's persistently <60 mL/min signify possible Chronic Kidney Disease.   . Anion gap 10/12/2014 9  5 - 15 Final  . Prothrombin Time 10/12/2014 13.5  11.6 - 15.2 seconds Final  . INR 10/12/2014 1.01  0.00 - 1.49 Final  . ABO/RH(D) 10/12/2014 O POS   Final  . Antibody Screen 10/12/2014 NEG   Final  . Sample Expiration 10/12/2014 10/21/2014   Final  . Color, Urine 10/12/2014 YELLOW  YELLOW Final  . APPearance 10/12/2014 CLEAR  CLEAR Final  . Specific Gravity, Urine 10/12/2014 1.004* 1.005 - 1.030 Final  . pH 10/12/2014 6.0  5.0 - 8.0 Final  . Glucose, UA 10/12/2014 NEGATIVE  NEGATIVE mg/dL Final  . Hgb urine dipstick 10/12/2014 NEGATIVE  NEGATIVE Final  . Bilirubin Urine 10/12/2014 NEGATIVE  NEGATIVE Final  . Ketones, ur 10/12/2014 NEGATIVE  NEGATIVE mg/dL Final  . Protein, ur 10/12/2014 NEGATIVE  NEGATIVE mg/dL Final  . Urobilinogen, UA 10/12/2014 0.2  0.0 - 1.0 mg/dL Final  . Nitrite 10/12/2014 NEGATIVE  NEGATIVE Final  . Leukocytes, UA 10/12/2014 NEGATIVE  NEGATIVE Final   MICROSCOPIC NOT DONE ON URINES WITH NEGATIVE PROTEIN, BLOOD, LEUKOCYTES, NITRITE, OR GLUCOSE <1000 mg/dL.  . ABO/RH(D) 10/12/2014 O POS   Final     X-Rays:No results found.  EKG: Orders placed or performed during the hospital encounter of  10/12/14  . EKG 12-Lead  . EKG 12-Lead     Hospital Course: Brandy Cruz is a 64 y.o. who was admitted to [facility]. They were brought to the operating room on 10/18/2014 and underwent Procedure(s): LEFT TOTAL KNEE ARTHROPLASTY.  Patient tolerated the procedure well and was later transferred to the recovery room and then to the orthopaedic floor for postoperative care.  They were given PO and IV analgesics for pain control following their surgery.  They were given 24 hours of postoperative antibiotics of  Anti-infectives    Start     Dose/Rate Route Frequency Ordered Stop   10/18/14 2300  vancomycin (VANCOCIN) IVPB 1000 mg/200 mL premix     1,000 mg 200 mL/hr over 60 Minutes Intravenous Every 12 hours 10/18/14 1437 10/19/14 0057   10/18/14 0835  ceFAZolin (ANCEF) IVPB 2 g/50 mL premix     2 g 100 mL/hr over 30 Minutes Intravenous On call to O.R. 10/18/14 9390 10/18/14 1210     and started on DVT prophylaxis in the form of Xarelto.   PT and OT were ordered for total joint protocol.  Discharge planning consulted to help with postop disposition and equipment needs.  Patient had a tough night on the evening of surgery.  They started to get up OOB with therapy on day one. Hemovac drain was pulled without difficulty.  Continued to work with therapy into day two.  Dressing was changed on day two and the incision was healing well. Patient was seen in rounds and was ready to go home.  Discharge home with home health Diet - Cardiac diet Follow up - in 2 weeks Activity - WBAT Disposition - Home Condition Upon Discharge - Good D/C Meds - See DC Summary DVT Prophylaxis - Xarelto   Discharge Instructions    Call MD / Call 911    Complete by:  As directed   If you experience chest pain or shortness of breath, CALL 911 and be transported to the hospital emergency room.  If you develope a fever above 101 F, pus (white drainage) or increased drainage or redness at the wound, or calf pain, call  your surgeon's office.     Change dressing    Complete by:  As directed   Change dressing daily with sterile 4 x 4 inch gauze dressing and apply TED hose. Do not submerge the incision under water.     Constipation Prevention    Complete by:  As directed   Drink plenty of fluids.  Prune juice may be helpful.  You may use a stool softener, such as Colace (over the counter) 100 mg twice a day.  Use MiraLax (over the counter) for constipation as needed.     Diet - low sodium heart healthy    Complete by:  As directed      Discharge instructions    Complete by:  As directed   Pick up stool softner and laxative for home use following surgery while on pain medications. Do not submerge incision under water. Please use good hand washing techniques while changing dressing each day. May shower starting three days after surgery. Please use a clean towel to pat the incision dry following showers. Continue to use ice for pain and swelling after surgery. Do not use any lotions or creams on the incision until instructed by your surgeon.  Take Xarelto for two and a half more weeks, then discontinue Xarelto. Once the patient has completed the blood thinner regimen, then take a Baby 81 mg Aspirin daily for three more weeks.  Postoperative Constipation Protocol  Constipation - defined medically as fewer than three stools per week and severe constipation as less than one stool per week.  One of the most common issues patients have following surgery is constipation.  Even if you have a regular bowel pattern at home, your normal regimen is likely to be  disrupted due to multiple reasons following surgery.  Combination of anesthesia, postoperative narcotics, change in appetite and fluid intake all can affect your bowels.  In order to avoid complications following surgery, here are some recommendations in order to help you during your recovery period.  Colace (docusate) - Pick up an over-the-counter form of Colace  or another stool softener and take twice a day as long as you are requiring postoperative pain medications.  Take with a full glass of water daily.  If you experience loose stools or diarrhea, hold the colace until you stool forms back up.  If your symptoms do not get better within 1 week or if they get worse, check with your doctor.  Dulcolax (bisacodyl) - Pick up over-the-counter and take as directed by the product packaging as needed to assist with the movement of your bowels.  Take with a full glass of water.  Use this product as needed if not relieved by Colace only.   MiraLax (polyethylene glycol) - Pick up over-the-counter to have on hand.  MiraLax is a solution that will increase the amount of water in your bowels to assist with bowel movements.  Take as directed and can mix with a glass of water, juice, soda, coffee, or tea.  Take if you go more than two days without a movement. Do not use MiraLax more than once per day. Call your doctor if you are still constipated or irregular after using this medication for 7 days in a row.  If you continue to have problems with postoperative constipation, please contact the office for further assistance and recommendations.  If you experience "the worst abdominal pain ever" or develop nausea or vomiting, please contact the office immediatly for further recommendations for treatment.     Do not put a pillow under the knee. Place it under the heel.    Complete by:  As directed      Do not sit on low chairs, stoools or toilet seats, as it may be difficult to get up from low surfaces    Complete by:  As directed      Driving restrictions    Complete by:  As directed   No driving until released by the physician.     Increase activity slowly as tolerated    Complete by:  As directed      Lifting restrictions    Complete by:  As directed   No lifting until released by the physician.     Patient may shower    Complete by:  As directed   You may shower  without a dressing once there is no drainage.  Do not wash over the wound.  If drainage remains, do not shower until drainage stops.     TED hose    Complete by:  As directed   Use stockings (TED hose) for 3 weeks on both leg(s).  You may remove them at night for sleeping.     Weight bearing as tolerated    Complete by:  As directed   Laterality:  left  Extremity:  Lower            Medication List    STOP taking these medications        ADVIL 200 MG tablet  Generic drug:  ibuprofen     JUICE PLUS FIBRE PO      TAKE these medications        acetaminophen 500 MG tablet  Commonly known as:  TYLENOL  Take 1,000 mg by mouth every 6 (six) hours as needed for moderate pain.     methocarbamol 500 MG tablet  Commonly known as:  ROBAXIN  Take 1 tablet (500 mg total) by mouth every 6 (six) hours as needed for muscle spasms.     oxyCODONE 5 MG immediate release tablet  Commonly known as:  Oxy IR/ROXICODONE  Take 1-2 tablets (5-10 mg total) by mouth every 3 (three) hours as needed for moderate pain or severe pain.     rivaroxaban 10 MG Tabs tablet  Commonly known as:  XARELTO  Take 1 tablet (10 mg total) by mouth daily with breakfast. Take Xarelto for two and a half more weeks, then discontinue Xarelto. Once the patient has completed the blood thinner regimen, then take a Baby 81 mg Aspirin daily for three more weeks.     traMADol 50 MG tablet  Commonly known as:  ULTRAM  Take 1-2 tablets (50-100 mg total) by mouth every 6 (six) hours as needed (mild pain).     valsartan-hydrochlorothiazide 320-12.5 MG per tablet  Commonly known as:  DIOVAN-HCT  Take 1 tablet by mouth every morning.           Follow-up Information    Follow up with Gearlean Alf, MD On 11/02/2014.   Specialty:  Orthopedic Surgery   Why:  Call office at [phone] to setup appointment with Dr. Wynelle Link on Tuesday 11/02/2014.   Contact information:   [address] [place]  [address] [phone]       Signed: Arlee Muslim, PA-C Orthopaedic Surgery 10/19/2014, 5:17 PM

## 2014-10-19 NOTE — Progress Notes (Signed)
   Subjective: 1 Day Post-Op Procedure(s) (LRB): LEFT TOTAL KNEE ARTHROPLASTY (Left) Patient reports pain as mild and moderate.  Tough night last night but better today. Patient seen in rounds with Dr. Lequita Halt. Patient is well, but has had some minor complaints of pain in the knee, requiring pain medications We will start therapy today.  Plan is to go Home after hospital stay.  Objective: Vital signs in last 24 hours: Temp:  [97.3 F (36.3 C)-98.4 F (36.9 C)] 98.1 F (36.7 C) (08/23 0523) Pulse Rate:  [55-89] 73 (08/23 0523) Resp:  [10-19] 16 (08/23 0523) BP: (110-151)/(57-96) 115/59 mmHg (08/23 0523) SpO2:  [97 %-100 %] 98 % (08/23 0523) Weight:  [68.04 kg (150 lb)-68.493 kg (151 lb)] 68.04 kg (150 lb) (08/22 1429)  Intake/Output from previous day:  Intake/Output Summary (Last 24 hours) at 10/19/14 0801 Last data filed at 10/19/14 0602  Gross per 24 hour  Intake   3475 ml  Output   4060 ml  Net   -585 ml    Intake/Output this shift: Over 2000 UOP since around [place]  Labs:  Recent Labs  10/19/14 0510  HGB 11.5*    Recent Labs  10/19/14 0510  WBC 14.1*  RBC 3.44*  HCT 32.4*  PLT 244    Recent Labs  10/19/14 0510  NA 136  K 3.5  CL 100*  CO2 28  BUN 6  CREATININE 0.60  GLUCOSE 150*  CALCIUM 8.8*   No results for input(s): LABPT, INR in the last 72 hours.  EXAM General - Patient is Alert, Appropriate and Oriented Extremity - Neurovascular intact Sensation intact distally Dorsiflexion/Plantar flexion intact Dressing - dressing C/D/I Motor Function - intact, moving foot and toes well on exam.  Hemovac pulled without difficulty.  Past Medical History  Diagnosis Date  . Hypertension   . Anxiety     hx of  . Arthritis   . Hemorrhoids   . Anemia     hx of    Assessment/Plan: 1 Day Post-Op Procedure(s) (LRB): LEFT TOTAL KNEE ARTHROPLASTY (Left) Principal Problem:   OA (osteoarthritis) of knee  Estimated body mass index is 25.73 kg/(m^2)  as calculated from the following:   Height as of this encounter:  (1.626 m).   Weight as of this encounter: 68.04 kg (150 lb). Advance diet Up with therapy Plan for discharge tomorrow Discharge home with home health  DVT Prophylaxis - Xarelto Weight-Bearing as tolerated to left leg D/C O2 and Pulse OX and try on Room Air  Avel Peace, PA-C Orthopaedic Surgery 10/19/2014, 8:01 AM

## 2014-10-19 NOTE — Evaluation (Signed)
Physical Therapy Evaluation Patient Details Name: Brandy Cruz MRN: [id number] DOB: [birth year] Today's Date: 10/19/2014   History of Present Illness  L TKA  Clinical Impression  Patient tolerated ambulating x 85' today. Patient will benefit from PT to address problems listed in note below.    Follow Up Recommendations Home health PT    Equipment Recommendations  Rolling walker with 5" wheels    Recommendations for Other Services       Precautions / Restrictions Precautions Precautions: Knee;Fall Required Braces or Orthoses: Knee Immobilizer - Left Knee Immobilizer - Left: Discontinue once straight leg raise with < 10 degree lag Restrictions Weight Bearing Restrictions: No      Mobility  Bed Mobility Overal bed mobility: Needs Assistance Bed Mobility: Supine to Sit     Supine to sit: Min assist     General bed mobility comments: cues for technique to the L side  Transfers Overall transfer level: Needs assistance Equipment used: Rolling walker (2 wheeled) Transfers: Sit to/from Stand Sit to Stand: Min assist;From elevated surface         General transfer comment: cues for jand and l leg placement  Ambulation/Gait Ambulation/Gait assistance: Min assist Ambulation Distance (Feet): 85 Feet Assistive device: Rolling walker (2 wheeled) Gait Pattern/deviations: WFL(Within Functional Limits);Step-through pattern;Step-to pattern;Decreased step length - left;Decreased stance time - left     General Gait Details: cues for sequence and posture  Stairs            Wheelchair Mobility    Modified Rankin (Stroke Patients Only)       Balance                                             Pertinent Vitals/Pain Pain Assessment: 0-10 Pain Score: 3  Pain Location: L knee Pain Descriptors / Indicators: Aching;Throbbing;Discomfort Pain Intervention(s): Limited activity within patient's tolerance;Monitored during session;Premedicated before  session;Ice applied    Home Living Family/patient expects to be discharged to:: Private residence Living Arrangements: Spouse/significant other Available Help at Discharge: Family Type of Home: House Home Access: Stairs to enter   Secretary/administrator of Steps: 1 high Home Layout: One level Home Equipment: Environmental consultant - 4 wheels      Prior Function Level of Independence: Independent               Hand Dominance        Extremity/Trunk Assessment   Upper Extremity Assessment: Defer to OT evaluation           Lower Extremity Assessment: LLE deficits/detail   LLE Deficits / Details: able to perform SLR, knee flexion 10-60L knee     Communication   Communication: No difficulties  Cognition Arousal/Alertness: Awake/alert Behavior During Therapy: WFL for tasks assessed/performed Overall Cognitive Status: Within Functional Limits for tasks assessed                      General Comments      Exercises Total Joint Exercises Ankle Circles/Pumps: AROM;Both;10 reps;Supine Quad Sets: AROM;Both;10 reps;Supine Heel Slides: AROM;Left;10 reps;Supine Hip ABduction/ADduction: AROM;Left;10 reps;Supine Straight Leg Raises: AROM;Left;10 reps;Supine      Assessment/Plan    PT Assessment Patient needs continued PT services  PT Diagnosis Difficulty walking;Acute pain   PT Problem List Decreased strength;Decreased range of motion;Decreased activity tolerance;Decreased mobility;Decreased knowledge of use of DME;Decreased safety awareness;Decreased knowledge of precautions;Pain  PT  Treatment Interventions DME instruction;Gait training;Stair training;Functional mobility training;Therapeutic activities;Therapeutic exercise;Patient/family education   PT Goals (Current goals can be found in the Care Plan section) Acute Rehab PT Goals Patient Stated Goal: to go home PT Goal Formulation: With patient/family Time For Goal Achievement: 10/23/14 Potential to Achieve Goals:  Good    Frequency 7X/week   Barriers to discharge        Co-evaluation               End of Session Equipment Utilized During Treatment: Left knee immobilizer Activity Tolerance: Patient tolerated treatment well Patient left: in chair;with call bell/phone within reach;with family/visitor present Nurse Communication: Mobility status         Time: 1610-9604 PT Time Calculation (min) (ACUTE ONLY): 22 min   Charges:   PT Evaluation $Initial PT Evaluation Tier I: 1 Procedure     PT G CodesRada Hay 10/19/2014, 11:24 AM Blanchard Kelch PT [phone]

## 2014-10-20 LAB — BASIC METABOLIC PANEL
Anion gap: 8 (ref 5–15)
BUN: 11 mg/dL (ref 6–20)
CO2: 29 mmol/L (ref 22–32)
Calcium: 8.5 mg/dL — ABNORMAL LOW (ref 8.9–10.3)
Chloride: 95 mmol/L — ABNORMAL LOW (ref 101–111)
Creatinine, Ser: 0.62 mg/dL (ref 0.44–1.00)
GFR calc Af Amer: >60 mL/min (ref >60)
GFR calc non Af Amer: >60 mL/min (ref >60)
Glucose, Bld: 127 mg/dL — ABNORMAL HIGH (ref 65–99)
Potassium: 3.3 mmol/L — ABNORMAL LOW (ref 3.5–5.1)
Sodium: 132 mmol/L — ABNORMAL LOW (ref 135–145)

## 2014-10-20 LAB — CBC
HCT: 29.2 % — ABNORMAL LOW (ref 36.0–46.0)
Hemoglobin: 10 g/dL — ABNORMAL LOW (ref 12.0–15.0)
MCH: 32.5 pg (ref 26.0–34.0)
MCHC: 34.2 g/dL (ref 30.0–36.0)
MCV: 94.8 fL (ref 78.0–100.0)
Platelets: 233 10*3/uL (ref 150–400)
RBC: 3.08 MIL/uL — ABNORMAL LOW (ref 3.87–5.11)
RDW: 12.6 % (ref 11.5–15.5)
WBC: 14.1 10*3/uL — ABNORMAL HIGH (ref 4.0–10.5)

## 2014-10-20 NOTE — Progress Notes (Signed)
Occupational Therapy Treatment Patient Details Name: Brandy Cruz MRN: [id number] DOB: 04-Mar-1950 Today's Date: 10/20/2014    History of present illness L TKA   OT comments  Pt doing well. Educated further on safe use of walker, LB dressing sequence, toilet transfers and clothing management. Pt not able to SLR so used KI with functional mobility and reviewed how to don/doff. Discussed use of ice frequently and use of TED hose.     Follow Up Recommendations  No OT follow up;Supervision/Assistance - 24 hour    Equipment Recommendations  3 in 1 bedside comode    Recommendations for Other Services      Precautions / Restrictions Precautions Precautions: Knee;Fall Required Braces or Orthoses: Knee Immobilizer - Left Knee Immobilizer - Left: Discontinue once straight leg raise with < 10 degree lag Restrictions Weight Bearing Restrictions: No       Mobility Bed Mobility Overal bed mobility: Needs Assistance Bed Mobility: Supine to Sit;Sit to Supine     Supine to sit: Min assist Sit to supine: Min assist   General bed mobility comments: for L LE onto and off bed.   Transfers Overall transfer level: Needs assistance Equipment used: Rolling walker (2 wheeled) Transfers: Sit to/from Stand Sit to Stand: Min guard;Supervision         General transfer comment: cues for hand placement.    Balance                                   ADL                           Toilet Transfer: Min guard;Stand-pivot;RW   Toileting- Clothing Manipulation and Hygiene: Min guard;Sit to/from stand         General ADL Comments: Discussed at length tub DMe options of tubbech versus chair and also option to sponge bathe. Emphasized need for her to be able to lift L LE on her own before attempting to step into tub and also to be able to accept full weight on L LE as she steps in tub with R LE. Pt states she will likely sponge initially but will check on getting a seat  for when she is ready to step into tub. Reviewed sequence for stepping in and out of tub. Reviewed sequence for LB dressing and pt donned shorts. Applied KI over shorts as pt not able to SLR this visit.       Vision                     Perception     Praxis      Cognition   Behavior During Therapy: WFL for tasks assessed/performed Overall Cognitive Status: Within Functional Limits for tasks assessed                       Extremity/Trunk Assessment               Exercises     Shoulder Instructions       General Comments      Pertinent Vitals/ Pain       Pain Assessment: 0-10 Pain Score: 5  Pain Location: L knee Pain Descriptors / Indicators: Aching;Sore Pain Intervention(s): Repositioned;Ice applied  Home Living  Prior Functioning/Environment              Frequency Min 2X/week     Progress Toward Goals  OT Goals(current goals can now be found in the care plan section)  Progress towards OT goals: Progressing toward goals     Plan Discharge plan remains appropriate    Co-evaluation                 End of Session Equipment Utilized During Treatment: Rolling walker;Left knee immobilizer CPM Left Knee CPM Left Knee: Off   Activity Tolerance Patient tolerated treatment well   Patient Left in chair;with call bell/phone within reach   Nurse Communication          Time: 1610-9604 OT Time Calculation (min): 22 min  Charges: OT General Charges $OT Visit: 1 Procedure OT Treatments $Therapeutic Activity: 8-22 mins  Lennox Laity  [phone] 10/20/2014, 9:42 AM

## 2014-10-20 NOTE — Plan of Care (Signed)
Problem: Consults Goal: Diagnosis- Total Joint Replacement Primary Total Knee, Left     

## 2014-10-20 NOTE — Progress Notes (Signed)
Physical Therapy Treatment Patient Details Name: Brandy Cruz MRN: [id number] DOB: March 24, 1950 Today's Date: 10/20/2014    History of Present Illness  L TKA    PT Comments    Patient is progressing well and  Is to DC to home,  Follow Up Recommendations  Home health PT     Equipment Recommendations  Rolling walker with 5" wheels    Recommendations for Other Services       Precautions / Restrictions      Mobility  Bed Mobility                  Transfers                    Ambulation/Gait Ambulation/Gait assistance: Supervision Ambulation Distance (Feet): 125 Feet Assistive device: Rolling walker (2 wheeled)           Stairs Stairs: Yes Stairs assistance: Min assist Stair Management: Step to pattern;Backwards;With walker Number of Stairs: 1 General stair comments: spouse present  Wheelchair Mobility    Modified Rankin (Stroke Patients Only)       Balance                                    Cognition                            Exercises Total Joint Exercises Ankle Circles/Pumps: AROM;Both;10 reps;Supine Quad Sets: AROM;Both;10 reps;Supine Towel Squeeze: AROM;Both;10 reps;Supine Short Arc Quad: AAROM;Left;10 reps;Supine Heel Slides: AAROM;Left;10 reps Hip ABduction/ADduction: AROM;Left;10 reps;Supine Straight Leg Raises: AROM;Left;10 reps;Supine Goniometric ROM: 10-60 L knee flexion    General Comments        Pertinent Vitals/Pain      Home Living                      Prior Function            PT Goals (current goals can now be found in the care plan section)      Frequency       PT Plan Current plan remains appropriate    Co-evaluation             End of Session Equipment Utilized During Treatment: Left knee immobilizer Activity Tolerance: Patient tolerated treatment well Patient left: in bed;with call bell/phone within reach;with nursing/sitter in room;with  family/visitor present     Time: 4782-9562 PT Time Calculation (min) (ACUTE ONLY): 25 min  Charges:  $Gait Training: 8-22 mins $Therapeutic Exercise: 8-22 mins                    G Codes:      Rada Hay 10/20/2014, 2:02 PM

## 2014-10-20 NOTE — Progress Notes (Signed)
   Subjective: 2 Days Post-Op Procedure(s) (LRB): LEFT TOTAL KNEE ARTHROPLASTY (Left) Patient reports pain as mild.   Patient seen in rounds by Dr. Lequita Halt. Patient is well, and has had no acute complaints or problems Patient is ready to go home  Objective: Vital signs in last 24 hours: Temp:  [98.2 F (36.8 C)-98.4 F (36.9 C)] 98.4 F (36.9 C) (08/24 0546) Pulse Rate:  [70-75] 75 (08/24 0546) Resp:  [16] 16 (08/24 0546) BP: (118-131)/(60-68) 118/60 mmHg (08/24 0546) SpO2:  [96 %-98 %] 96 % (08/24 0546)  Intake/Output from previous day:  Intake/Output Summary (Last 24 hours) at 10/20/14 0745 Last data filed at 10/19/14 1700  Gross per 24 hour  Intake 819.66 ml  Output   1400 ml  Net -580.34 ml    Intake/Output this shift:    Labs:  Recent Labs  10/19/14 0510 10/20/14 0504  HGB 11.5* 10.0*    Recent Labs  10/19/14 0510 10/20/14 0504  WBC 14.1* 14.1*  RBC 3.44* 3.08*  HCT 32.4* 29.2*  PLT 244 233    Recent Labs  10/19/14 0510 10/20/14 0504  NA 136 132*  K 3.5 3.3*  CL 100* 95*  CO2 28 29  BUN 6 11  CREATININE 0.60 0.62  GLUCOSE 150* 127*  CALCIUM 8.8* 8.5*   No results for input(s): LABPT, INR in the last 72 hours.  EXAM: General - Patient is Alert, Appropriate and Oriented Extremity - Neurovascular intact Sensation intact distally Incision - clean, dry, no drainage Motor Function - intact, moving foot and toes well on exam.   Assessment/Plan: 2 Days Post-Op Procedure(s) (LRB): LEFT TOTAL KNEE ARTHROPLASTY (Left) Procedure(s) (LRB): LEFT TOTAL KNEE ARTHROPLASTY (Left) Past Medical History  Diagnosis Date  . Hypertension   . Anxiety     hx of  . Arthritis   . Hemorrhoids   . Anemia     hx of   Principal Problem:   OA (osteoarthritis) of knee  Estimated body mass index is 25.73 kg/(m^2) as calculated from the following:   Height as of this encounter:  (1.626 m).   Weight as of this encounter: 68.04 kg (150 lb). Up with  therapy Discharge home with home health Diet - Cardiac diet Follow up - in 2 weeks Activity - WBAT Disposition - Home Condition Upon Discharge - Good D/C Meds - See DC Summary DVT Prophylaxis - Xarelto  Avel Peace, PA-C Orthopaedic Surgery 10/20/2014, 7:45 AM    33

## 2014-10-20 NOTE — Care Management Note (Signed)
Case Management Note  Patient Details  Name: [name] MRN: [id number] Date of Birth: [birth year]  Subjective/Objective:                   LEFT TOTAL KNEE ARTHROPLASTY (Left) Action/Plan: Discharge planning Expected Discharge Date:  10/20/14               Expected Discharge Plan:  [place]  In-House Referral:     Discharge planning Services  CM Consult  Post Acute Care Choice:  Home Health Choice offered to:  Patient  DME Arranged:  3-N-1 DME Agency:  [place]:  PT [place] Agency:  [place]  Status of Service:  Completed, signed off  Medicare Important Message Given:    Date Medicare IM Given:    Medicare IM give by:    Date Additional Medicare IM Given:    Additional Medicare Important Message give by:     If discussed at [place] of Stay Meetings, dates discussed:    Additional Comments: CM met with pt in room to offer choice of home health agency.  Pt chooses [facility] to render HHPT.  Address and contact information verified by pt.  Referral called to [facility] rep, Kristen.  CM called [facility] DME rep, Lecretia to please deliver the rolling walker and 3n1 to room so pt can discharge today.  No other CM needs were communicated.   Dellie Catholic, RN 10/20/2014, 9:40 AM

## 2014-10-20 NOTE — Plan of Care (Deleted)
Problem: Consults Goal: Diagnosis- Total Joint Replacement Primary Total Hip     

## 2014-10-20 NOTE — Progress Notes (Signed)
RN reviewed discharge instructions with patient and family. All questions answered.  Paperwork and prescriptions given.   NT rolled patient down in wheelchair to family car.  

## 2014-10-20 NOTE — Care Management Important Message (Signed)
Important Message  Patient Details  Name: EMMORY SOLIVAN MRN: [id number] Date of Birth: Oct 16, 1950   Medicare Important Message Given:  Yes-second notification given    Haskell Flirt 10/20/2014, 2:14 PMImportant Message  Patient Details  Name: SAANVIKA VAZQUES MRN: [id number] Date of Birth: [birth year]   Medicare Important Message Given:  Yes-second notification given    Haskell Flirt 10/20/2014, 2:14 PM

## 2014-10-21 DIAGNOSIS — Z96652 Presence of left artificial knee joint: Secondary | ICD-10-CM | POA: Diagnosis not present

## 2014-10-21 DIAGNOSIS — M1711 Unilateral primary osteoarthritis, right knee: Secondary | ICD-10-CM | POA: Diagnosis not present

## 2014-10-21 DIAGNOSIS — Z471 Aftercare following joint replacement surgery: Secondary | ICD-10-CM | POA: Diagnosis not present

## 2014-10-21 DIAGNOSIS — E785 Hyperlipidemia, unspecified: Secondary | ICD-10-CM | POA: Diagnosis not present

## 2014-10-21 DIAGNOSIS — I10 Essential (primary) hypertension: Secondary | ICD-10-CM | POA: Diagnosis not present

## 2014-10-22 DIAGNOSIS — M179 Osteoarthritis of knee, unspecified: Secondary | ICD-10-CM | POA: Diagnosis not present

## 2014-10-22 DIAGNOSIS — I10 Essential (primary) hypertension: Secondary | ICD-10-CM | POA: Diagnosis not present

## 2014-10-22 DIAGNOSIS — Z471 Aftercare following joint replacement surgery: Secondary | ICD-10-CM | POA: Diagnosis not present

## 2014-10-22 DIAGNOSIS — Z96652 Presence of left artificial knee joint: Secondary | ICD-10-CM | POA: Diagnosis not present

## 2014-10-22 DIAGNOSIS — M1711 Unilateral primary osteoarthritis, right knee: Secondary | ICD-10-CM | POA: Diagnosis not present

## 2014-10-22 DIAGNOSIS — E785 Hyperlipidemia, unspecified: Secondary | ICD-10-CM | POA: Diagnosis not present

## 2014-10-23 DIAGNOSIS — Z471 Aftercare following joint replacement surgery: Secondary | ICD-10-CM | POA: Diagnosis not present

## 2014-10-23 DIAGNOSIS — M1711 Unilateral primary osteoarthritis, right knee: Secondary | ICD-10-CM | POA: Diagnosis not present

## 2014-10-23 DIAGNOSIS — I10 Essential (primary) hypertension: Secondary | ICD-10-CM | POA: Diagnosis not present

## 2014-10-23 DIAGNOSIS — Z96652 Presence of left artificial knee joint: Secondary | ICD-10-CM | POA: Diagnosis not present

## 2014-10-23 DIAGNOSIS — E785 Hyperlipidemia, unspecified: Secondary | ICD-10-CM | POA: Diagnosis not present

## 2014-10-25 DIAGNOSIS — E785 Hyperlipidemia, unspecified: Secondary | ICD-10-CM | POA: Diagnosis not present

## 2014-10-25 DIAGNOSIS — Z471 Aftercare following joint replacement surgery: Secondary | ICD-10-CM | POA: Diagnosis not present

## 2014-10-25 DIAGNOSIS — I10 Essential (primary) hypertension: Secondary | ICD-10-CM | POA: Diagnosis not present

## 2014-10-25 DIAGNOSIS — Z96652 Presence of left artificial knee joint: Secondary | ICD-10-CM | POA: Diagnosis not present

## 2014-10-25 DIAGNOSIS — M1711 Unilateral primary osteoarthritis, right knee: Secondary | ICD-10-CM | POA: Diagnosis not present

## 2014-10-27 DIAGNOSIS — I10 Essential (primary) hypertension: Secondary | ICD-10-CM | POA: Diagnosis not present

## 2014-10-27 DIAGNOSIS — M1711 Unilateral primary osteoarthritis, right knee: Secondary | ICD-10-CM | POA: Diagnosis not present

## 2014-10-27 DIAGNOSIS — Z471 Aftercare following joint replacement surgery: Secondary | ICD-10-CM | POA: Diagnosis not present

## 2014-10-27 DIAGNOSIS — E785 Hyperlipidemia, unspecified: Secondary | ICD-10-CM | POA: Diagnosis not present

## 2014-10-27 DIAGNOSIS — Z96652 Presence of left artificial knee joint: Secondary | ICD-10-CM | POA: Diagnosis not present

## 2014-10-29 DIAGNOSIS — Z471 Aftercare following joint replacement surgery: Secondary | ICD-10-CM | POA: Diagnosis not present

## 2014-10-29 DIAGNOSIS — Z96652 Presence of left artificial knee joint: Secondary | ICD-10-CM | POA: Diagnosis not present

## 2014-10-29 DIAGNOSIS — E785 Hyperlipidemia, unspecified: Secondary | ICD-10-CM | POA: Diagnosis not present

## 2014-10-29 DIAGNOSIS — M1711 Unilateral primary osteoarthritis, right knee: Secondary | ICD-10-CM | POA: Diagnosis not present

## 2014-10-29 DIAGNOSIS — I10 Essential (primary) hypertension: Secondary | ICD-10-CM | POA: Diagnosis not present

## 2014-11-01 DIAGNOSIS — Z96652 Presence of left artificial knee joint: Secondary | ICD-10-CM | POA: Diagnosis not present

## 2014-11-01 DIAGNOSIS — M1711 Unilateral primary osteoarthritis, right knee: Secondary | ICD-10-CM | POA: Diagnosis not present

## 2014-11-01 DIAGNOSIS — E785 Hyperlipidemia, unspecified: Secondary | ICD-10-CM | POA: Diagnosis not present

## 2014-11-01 DIAGNOSIS — Z471 Aftercare following joint replacement surgery: Secondary | ICD-10-CM | POA: Diagnosis not present

## 2014-11-01 DIAGNOSIS — I10 Essential (primary) hypertension: Secondary | ICD-10-CM | POA: Diagnosis not present

## 2014-11-03 DIAGNOSIS — I10 Essential (primary) hypertension: Secondary | ICD-10-CM | POA: Diagnosis not present

## 2014-11-03 DIAGNOSIS — Z96652 Presence of left artificial knee joint: Secondary | ICD-10-CM | POA: Diagnosis not present

## 2014-11-03 DIAGNOSIS — M1711 Unilateral primary osteoarthritis, right knee: Secondary | ICD-10-CM | POA: Diagnosis not present

## 2014-11-03 DIAGNOSIS — Z471 Aftercare following joint replacement surgery: Secondary | ICD-10-CM | POA: Diagnosis not present

## 2014-11-03 DIAGNOSIS — E785 Hyperlipidemia, unspecified: Secondary | ICD-10-CM | POA: Diagnosis not present

## 2014-11-04 DIAGNOSIS — E785 Hyperlipidemia, unspecified: Secondary | ICD-10-CM | POA: Diagnosis not present

## 2014-11-04 DIAGNOSIS — Z96652 Presence of left artificial knee joint: Secondary | ICD-10-CM | POA: Diagnosis not present

## 2014-11-04 DIAGNOSIS — I10 Essential (primary) hypertension: Secondary | ICD-10-CM | POA: Diagnosis not present

## 2014-11-04 DIAGNOSIS — M1711 Unilateral primary osteoarthritis, right knee: Secondary | ICD-10-CM | POA: Diagnosis not present

## 2014-11-04 DIAGNOSIS — Z471 Aftercare following joint replacement surgery: Secondary | ICD-10-CM | POA: Diagnosis not present

## 2014-11-08 DIAGNOSIS — M6281 Muscle weakness (generalized): Secondary | ICD-10-CM | POA: Diagnosis not present

## 2014-11-08 DIAGNOSIS — M25662 Stiffness of left knee, not elsewhere classified: Secondary | ICD-10-CM | POA: Diagnosis not present

## 2014-11-08 DIAGNOSIS — Z96652 Presence of left artificial knee joint: Secondary | ICD-10-CM | POA: Diagnosis not present

## 2014-11-12 DIAGNOSIS — M6281 Muscle weakness (generalized): Secondary | ICD-10-CM | POA: Diagnosis not present

## 2014-11-12 DIAGNOSIS — M25662 Stiffness of left knee, not elsewhere classified: Secondary | ICD-10-CM | POA: Diagnosis not present

## 2014-11-12 DIAGNOSIS — Z96652 Presence of left artificial knee joint: Secondary | ICD-10-CM | POA: Diagnosis not present

## 2014-11-15 DIAGNOSIS — Z96652 Presence of left artificial knee joint: Secondary | ICD-10-CM | POA: Diagnosis not present

## 2014-11-15 DIAGNOSIS — M6281 Muscle weakness (generalized): Secondary | ICD-10-CM | POA: Diagnosis not present

## 2014-11-15 DIAGNOSIS — M25662 Stiffness of left knee, not elsewhere classified: Secondary | ICD-10-CM | POA: Diagnosis not present

## 2014-11-17 DIAGNOSIS — Z96652 Presence of left artificial knee joint: Secondary | ICD-10-CM | POA: Diagnosis not present

## 2014-11-17 DIAGNOSIS — M25662 Stiffness of left knee, not elsewhere classified: Secondary | ICD-10-CM | POA: Diagnosis not present

## 2014-11-17 DIAGNOSIS — M6281 Muscle weakness (generalized): Secondary | ICD-10-CM | POA: Diagnosis not present

## 2014-11-19 DIAGNOSIS — M25662 Stiffness of left knee, not elsewhere classified: Secondary | ICD-10-CM | POA: Diagnosis not present

## 2014-11-19 DIAGNOSIS — M6281 Muscle weakness (generalized): Secondary | ICD-10-CM | POA: Diagnosis not present

## 2014-11-19 DIAGNOSIS — Z96652 Presence of left artificial knee joint: Secondary | ICD-10-CM | POA: Diagnosis not present

## 2014-11-22 DIAGNOSIS — Z96652 Presence of left artificial knee joint: Secondary | ICD-10-CM | POA: Diagnosis not present

## 2014-11-22 DIAGNOSIS — M25662 Stiffness of left knee, not elsewhere classified: Secondary | ICD-10-CM | POA: Diagnosis not present

## 2014-11-22 DIAGNOSIS — M6281 Muscle weakness (generalized): Secondary | ICD-10-CM | POA: Diagnosis not present

## 2014-11-23 DIAGNOSIS — Z471 Aftercare following joint replacement surgery: Secondary | ICD-10-CM | POA: Diagnosis not present

## 2014-11-23 DIAGNOSIS — Z96652 Presence of left artificial knee joint: Secondary | ICD-10-CM | POA: Diagnosis not present

## 2014-11-24 DIAGNOSIS — Z96652 Presence of left artificial knee joint: Secondary | ICD-10-CM | POA: Diagnosis not present

## 2014-11-24 DIAGNOSIS — M6281 Muscle weakness (generalized): Secondary | ICD-10-CM | POA: Diagnosis not present

## 2014-11-24 DIAGNOSIS — M25662 Stiffness of left knee, not elsewhere classified: Secondary | ICD-10-CM | POA: Diagnosis not present

## 2014-11-26 DIAGNOSIS — M6281 Muscle weakness (generalized): Secondary | ICD-10-CM | POA: Diagnosis not present

## 2014-11-26 DIAGNOSIS — M25662 Stiffness of left knee, not elsewhere classified: Secondary | ICD-10-CM | POA: Diagnosis not present

## 2014-11-26 DIAGNOSIS — Z96652 Presence of left artificial knee joint: Secondary | ICD-10-CM | POA: Diagnosis not present

## 2014-11-29 DIAGNOSIS — M25662 Stiffness of left knee, not elsewhere classified: Secondary | ICD-10-CM | POA: Diagnosis not present

## 2014-11-29 DIAGNOSIS — M6281 Muscle weakness (generalized): Secondary | ICD-10-CM | POA: Diagnosis not present

## 2014-11-29 DIAGNOSIS — Z96652 Presence of left artificial knee joint: Secondary | ICD-10-CM | POA: Diagnosis not present

## 2014-12-01 DIAGNOSIS — M6281 Muscle weakness (generalized): Secondary | ICD-10-CM | POA: Diagnosis not present

## 2014-12-01 DIAGNOSIS — Z96652 Presence of left artificial knee joint: Secondary | ICD-10-CM | POA: Diagnosis not present

## 2014-12-01 DIAGNOSIS — M25662 Stiffness of left knee, not elsewhere classified: Secondary | ICD-10-CM | POA: Diagnosis not present

## 2014-12-03 DIAGNOSIS — Z96652 Presence of left artificial knee joint: Secondary | ICD-10-CM | POA: Diagnosis not present

## 2014-12-03 DIAGNOSIS — M6281 Muscle weakness (generalized): Secondary | ICD-10-CM | POA: Diagnosis not present

## 2014-12-03 DIAGNOSIS — M25662 Stiffness of left knee, not elsewhere classified: Secondary | ICD-10-CM | POA: Diagnosis not present

## 2014-12-06 DIAGNOSIS — M25662 Stiffness of left knee, not elsewhere classified: Secondary | ICD-10-CM | POA: Diagnosis not present

## 2014-12-06 DIAGNOSIS — Z96652 Presence of left artificial knee joint: Secondary | ICD-10-CM | POA: Diagnosis not present

## 2014-12-06 DIAGNOSIS — M6281 Muscle weakness (generalized): Secondary | ICD-10-CM | POA: Diagnosis not present

## 2014-12-09 DIAGNOSIS — Z23 Encounter for immunization: Secondary | ICD-10-CM | POA: Diagnosis not present

## 2014-12-16 DIAGNOSIS — Z96652 Presence of left artificial knee joint: Secondary | ICD-10-CM | POA: Diagnosis not present

## 2014-12-16 DIAGNOSIS — M6281 Muscle weakness (generalized): Secondary | ICD-10-CM | POA: Diagnosis not present

## 2014-12-16 DIAGNOSIS — M25662 Stiffness of left knee, not elsewhere classified: Secondary | ICD-10-CM | POA: Diagnosis not present

## 2014-12-20 DIAGNOSIS — M25662 Stiffness of left knee, not elsewhere classified: Secondary | ICD-10-CM | POA: Diagnosis not present

## 2014-12-20 DIAGNOSIS — Z96652 Presence of left artificial knee joint: Secondary | ICD-10-CM | POA: Diagnosis not present

## 2014-12-20 DIAGNOSIS — M6281 Muscle weakness (generalized): Secondary | ICD-10-CM | POA: Diagnosis not present

## 2014-12-27 DIAGNOSIS — Z96652 Presence of left artificial knee joint: Secondary | ICD-10-CM | POA: Diagnosis not present

## 2014-12-27 DIAGNOSIS — M25662 Stiffness of left knee, not elsewhere classified: Secondary | ICD-10-CM | POA: Diagnosis not present

## 2014-12-27 DIAGNOSIS — M6281 Muscle weakness (generalized): Secondary | ICD-10-CM | POA: Diagnosis not present

## 2015-01-10 DIAGNOSIS — M109 Gout, unspecified: Secondary | ICD-10-CM | POA: Diagnosis not present

## 2015-02-02 DIAGNOSIS — I10 Essential (primary) hypertension: Secondary | ICD-10-CM | POA: Diagnosis not present

## 2015-02-02 DIAGNOSIS — J40 Bronchitis, not specified as acute or chronic: Secondary | ICD-10-CM | POA: Diagnosis not present

## 2015-02-02 DIAGNOSIS — M109 Gout, unspecified: Secondary | ICD-10-CM | POA: Diagnosis not present

## 2015-02-08 DIAGNOSIS — H40003 Preglaucoma, unspecified, bilateral: Secondary | ICD-10-CM | POA: Diagnosis not present

## 2015-02-17 DIAGNOSIS — Z471 Aftercare following joint replacement surgery: Secondary | ICD-10-CM | POA: Diagnosis not present

## 2015-02-17 DIAGNOSIS — Z96652 Presence of left artificial knee joint: Secondary | ICD-10-CM | POA: Diagnosis not present

## 2017-02-12 DIAGNOSIS — R0789 Other chest pain: Secondary | ICD-10-CM | POA: Insufficient documentation

## 2017-02-12 DIAGNOSIS — R079 Chest pain, unspecified: Secondary | ICD-10-CM

## 2017-02-12 HISTORY — DX: Other chest pain: R07.89

## 2017-02-13 ENCOUNTER — Ambulatory Visit (INDEPENDENT_AMBULATORY_CARE_PROVIDER_SITE_OTHER): Payer: Medicare Other | Admitting: Cardiology

## 2017-02-13 VITALS — BP 140/74 | HR 76 | Ht 64.0 in | Wt 150.0 lb

## 2017-02-13 DIAGNOSIS — R9431 Abnormal electrocardiogram [ECG] [EKG]: Secondary | ICD-10-CM

## 2017-02-13 DIAGNOSIS — R079 Chest pain, unspecified: Secondary | ICD-10-CM | POA: Diagnosis not present

## 2017-02-13 HISTORY — DX: Abnormal electrocardiogram (ECG) (EKG): R94.31

## 2017-02-13 NOTE — Progress Notes (Signed)
Cardiology Consultation:    Date:  02/13/2017   ID:  Brandy SaverCathy C Croson, DOB 11/02/1950, MRN [id number]  PCP:  Desmond Dikeameron, John, MD  Cardiologist:  Gypsy Balsamobert Avary Eichenberger, MD   Referring MD: Desmond Dikeameron, John, MD   Chief Complaint  Patient presents with  . Chest Pain  . Abnormal ECG  I have abnormal EKG and chest pain  History of Present Illness:    Brandy Cruz is a 66 y.o. female who is being seen today for the evaluation of chest pain at the request of Desmond Dikeameron, John, MD.  For last month or so she has been experiencing chest pain pain is located in the left shoulder and also on the left breast.  Kind of burning-like sensation lasting up to couple hours straight.  She graded the sensation 3-5 scale up to 10.  There is no shortness of breath associated with this sensation there is no sweating associated with this sensation.  It is not provoked by exercise it is not relieved by anything.  She never had anything like this before.  She went to her primary care physician who did EKG and she find some Q waves inferiorly and also poor R wave progression anterior precordium rising suspicion for anterior wall MI.  She was sent to the emergency room troponin was checked which was normal she was sent to us for evaluation.  The moment of my interview she is doing well.  Her exercise capacity is limited because of chronic arthritis in both knees.  Actually year ago she had left knee replacement done.  She never smoked does have high blood pressure for many years no family history of premature artery disease, does not have diabetes.  Past Medical History:  Diagnosis Date  . Anemia    hx of  . Anxiety    hx of  . Arthritis   . Hemorrhoids   . Hyperlipidemia   . Hypertension     Past Surgical History:  Procedure Laterality Date  . APPENDECTOMY     2009  . CHOLECYSTECTOMY     2013  . TOTAL KNEE ARTHROPLASTY Left 10/18/2014   Procedure: LEFT TOTAL KNEE ARTHROPLASTY;  Surgeon: Ollen GrossFrank Aluisio, MD;  Location: [facility]  [facility];  Service: Orthopedics;  Laterality: Left;    Current Medications: Current Meds  Medication Sig  . allopurinol (ZYLOPRIM) 100 MG tablet Take 100 mg by mouth daily.  Marland Kitchen. ALPRAZolam (XANAX) 0.25 MG tablet Take 0.25 mg by mouth 2 (two) times daily as needed for anxiety.  . clotrimazole-betamethasone (LOTRISONE) cream Apply 1 application topically 2 (two) times daily.  . colchicine 0.6 MG tablet Take 0.6 mg by mouth daily.  . pantoprazole (PROTONIX) 40 MG tablet Take 1 tablet by mouth daily.  . sucralfate (CARAFATE) 1 g tablet Take 1 g by mouth 4 (four) times daily -  with meals and at bedtime.  . valsartan-hydrochlorothiazide (DIOVAN-HCT) 320-12.5 MG per tablet Take 1 tablet by mouth every morning.     Allergies:   Sulfa antibiotics; Levaquin [levofloxacin in d5w]; and Penicillins   Social History   Socioeconomic History  . Marital status: Married    Spouse name: Not on file  . Number of children: Not on file  . Years of education: Not on file  . Highest education level: Not on file  Social Needs  . Financial resource strain: Not on file  . Food insecurity - worry: Not on file  . Food insecurity - inability: Not on file  . Transportation needs -  medical: Not on file  . Transportation needs - non-medical: Not on file  Occupational History  . Not on file  Tobacco Use  . Smoking status: Never Smoker  . Smokeless tobacco: Never Used  Substance and Sexual Activity  . Alcohol use: Not on file    Comment: occasionally  . Drug use: No  . Sexual activity: Not on file  Other Topics Concern  . Not on file  Social History Narrative  . Not on file     Family History: The patient's family history includes Arthritis in her mother; Prostate cancer in her father. ROS:   Please see the history of present illness.    All 14 point review of systems negative except as described per history of present illness.  EKGs/Labs/Other Studies Reviewed:    The following studies were reviewed  today: EKG from primary care physician reviewed  EKG:  EKG is  ordered today.  The ekg ordered today demonstrates normal sinus rhythm normal P interval poor R wave progression anterior precordium  Recent Labs: No results found for requested labs within last 8760 hours.  Recent Lipid Panel No results found for: CHOL, TRIG, HDL, CHOLHDL, VLDL, LDLCALC, LDLDIRECT  Physical Exam:    VS:  BP 140/74   Pulse 76   Ht 5\' 4"  (1.626 m)   Wt 150 lb (68 kg)   SpO2 97%   BMI 25.75 kg/m     Wt Readings from Last 3 Encounters:  02/13/17 150 lb (68 kg)  10/18/14 150 lb (68 kg)  10/12/14 151 lb (68.5 kg)     GEN:  Well nourished, well developed in no acute distress HEENT: Normal NECK: No JVD; No carotid bruits LYMPHATICS: No lymphadenopathy CARDIAC: RRR, no murmurs, no rubs, no gallops RESPIRATORY:  Clear to auscultation without rales, wheezing or rhonchi  ABDOMEN: Soft, non-tender, non-distended MUSCULOSKELETAL:  No edema; No deformity  SKIN: Warm and dry NEUROLOGIC:  Alert and oriented x 3 PSYCHIATRIC:  Normal affect   ASSESSMENT:    1. Chest pain, unspecified type   2. Abnormal EKG    PLAN:    In order of problems listed above:  1. Atypical chest pain: She does have a few risk factors for coronary artery disease I advised her to start taking one baby aspirin every single day.  She will be scheduled to have stress test.  We will do stress echocardiogram.  I will also ask her to have an echocardiogram to check and see if she had any evidence of wall microinfarction.  Until then since I have a lower level of suspicion for the problem I will only put her on aspirin.  I told her not to push herself until we do stress testing. 2. Cholesterol status: Unknown I will ask her to have fasting lipid profile done.   Medication Adjustments/Labs and Tests Ordered: Current medicines are reviewed at length with the patient today.  Concerns regarding medicines are outlined above.  Orders Placed  This Encounter  Procedures  . EKG 12-Lead  . ECHOCARDIOGRAM COMPLETE  . ECHOCARDIOGRAM STRESS TEST   No orders of the defined types were placed in this encounter.   Signed, Georgeanna Leaobert J. Farzad Tibbetts, MD, [facility]. 02/13/2017 5:00 PM    [facility]

## 2017-02-13 NOTE — Patient Instructions (Addendum)
Medication Instructions:  Your physician recommends that you continue on your current medications as directed. Please refer to the Current Medication list given to you today.  Labwork: None ordered  Testing/Procedures: EKG today  Your physician has requested that you have an echocardiogram. Echocardiography is a painless test that uses sound waves to create images of your heart. It provides your doctor with information about the size and shape of your heart and how well your heart's chambers and valves are working. This procedure takes approximately one hour. There are no restrictions for this procedure.  Your physician has requested that you have a stress echocardiogram. For further information please visit [email or web address]. Please follow instruction sheet as given.   Follow-Up: Your physician recommends that you schedule a follow-up appointment in: 1 month with Dr. Bing MatterKrasowski   Any Other Special Instructions Will Be Listed Below (If Applicable).     If you need a refill on your cardiac medications before your next appointment, please call your pharmacy.

## 2017-02-14 LAB — LIPID PANEL
Chol/HDL Ratio: 3.8 ratio (ref 0.0–4.4)
Cholesterol, Total: 214 mg/dL — ABNORMAL HIGH (ref 100–199)
HDL: 56 mg/dL (ref >39)
LDL Calculated: 129 mg/dL — ABNORMAL HIGH (ref 0–99)
Triglycerides: 144 mg/dL (ref 0–149)
VLDL Cholesterol Cal: 29 mg/dL (ref 5–40)

## 2017-02-15 ENCOUNTER — Telehealth (HOSPITAL_COMMUNITY): Payer: Self-pay | Admitting: *Deleted

## 2017-02-15 NOTE — Telephone Encounter (Signed)
Patient given detailed instructions per Stress Test Requisition Sheet for test on 02/28/17 at 2:30. Patient Notified to arrive 30 minutes early, and that it is imperative to arrive on time for appointment to keep from having the test rescheduled.  Patient verbalized understanding. Brandy DolinSharon S Brooks

## 2017-02-28 ENCOUNTER — Other Ambulatory Visit: Payer: Self-pay

## 2017-02-28 ENCOUNTER — Ambulatory Visit (HOSPITAL_COMMUNITY): Payer: Medicare Other

## 2017-02-28 ENCOUNTER — Ambulatory Visit (HOSPITAL_COMMUNITY): Payer: Medicare Other | Attending: Cardiology

## 2017-02-28 ENCOUNTER — Encounter (HOSPITAL_COMMUNITY): Payer: Self-pay

## 2017-02-28 ENCOUNTER — Other Ambulatory Visit (HOSPITAL_COMMUNITY): Payer: Medicare Other

## 2017-02-28 ENCOUNTER — Telehealth (HOSPITAL_COMMUNITY): Payer: Self-pay | Admitting: *Deleted

## 2017-02-28 DIAGNOSIS — R079 Chest pain, unspecified: Secondary | ICD-10-CM | POA: Insufficient documentation

## 2017-02-28 DIAGNOSIS — F419 Anxiety disorder, unspecified: Secondary | ICD-10-CM | POA: Diagnosis not present

## 2017-02-28 DIAGNOSIS — R9431 Abnormal electrocardiogram [ECG] [EKG]: Secondary | ICD-10-CM | POA: Diagnosis present

## 2017-02-28 DIAGNOSIS — I10 Essential (primary) hypertension: Secondary | ICD-10-CM | POA: Insufficient documentation

## 2017-02-28 DIAGNOSIS — E785 Hyperlipidemia, unspecified: Secondary | ICD-10-CM | POA: Diagnosis not present

## 2017-02-28 DIAGNOSIS — D649 Anemia, unspecified: Secondary | ICD-10-CM | POA: Insufficient documentation

## 2017-02-28 NOTE — Telephone Encounter (Signed)
Patient arrived for her Echocardiogram and voiced concerns about walking on the treadmill for her Stress Echocardiogram.  I explained the test to the patient and she felt her orthopedic issues would prevent her from achieving the heart rate needed for a good test.  I notified Dr. Vanetta ShawlKrasowski's office of her concern.  Patient is awaiting a phone call from Dr. Bing MatterKrasowski to determine what, if any, testing can be done.

## 2017-03-01 ENCOUNTER — Telehealth: Payer: Self-pay | Admitting: Cardiology

## 2017-03-01 NOTE — Telephone Encounter (Signed)
Patient informed of results.  

## 2017-03-01 NOTE — Telephone Encounter (Signed)
Patient is calling for Echo results.  

## 2017-03-18 ENCOUNTER — Ambulatory Visit: Payer: Medicare Other | Admitting: Cardiology

## 2017-03-18 ENCOUNTER — Encounter: Payer: Self-pay | Admitting: Cardiology

## 2017-03-18 VITALS — BP 120/70 | HR 88 | Resp 14 | Ht 64.0 in | Wt 153.0 lb

## 2017-03-18 DIAGNOSIS — R0789 Other chest pain: Secondary | ICD-10-CM

## 2017-03-18 DIAGNOSIS — E785 Hyperlipidemia, unspecified: Secondary | ICD-10-CM

## 2017-03-18 DIAGNOSIS — R9431 Abnormal electrocardiogram [ECG] [EKG]: Secondary | ICD-10-CM | POA: Diagnosis not present

## 2017-03-18 DIAGNOSIS — R072 Precordial pain: Secondary | ICD-10-CM

## 2017-03-18 HISTORY — DX: Hyperlipidemia, unspecified: E78.5

## 2017-03-18 NOTE — Addendum Note (Signed)
Addended by: Arville CareHUNT, Hilma Steinhilber N on: 03/18/2017 04:39 PM   Modules accepted: Orders

## 2017-03-18 NOTE — Patient Instructions (Signed)
Medication Instructions:  Your physician recommends that you continue on your current medications as directed. Please refer to the Current Medication list given to you today.  Labwork: None ordered  Testing/Procedures: Your physician has requested that you have a lexiscan myoview. For further information please visit [email or web address]. Please follow instruction sheet, as given.  Stress Test Directions for [facility]: 1.) Please check in at the outpatient center at [facility] the day of your testing. 2.) Nothing to eat or drink after midnight prior to testing. You may take your medications that morning with water except the metformin.   3.) Please be aware that the test can take up to 3-4 hours. This is a 2 day test process and you will follow the same instructions for both days.  4.) Should you have any problem with the appointment date or time, please call [phone].    Follow-Up: Your physician recommends that you schedule a follow-up appointment in: 3 months with Dr. Krasowski   Any Other Special Instructions Will Be Listed Below (If Applicable).     If you need a refill on your cardiac medications before your next appointment, please call your pharmacy.   

## 2017-03-18 NOTE — Progress Notes (Signed)
Cardiology Office Note:    Date:  03/18/2017   ID:  JENIN BIRDSALL, DOB [birth year], MRN [id number]  PCP:  Marylen Ponto, MD  Cardiologist:  Gypsy Balsam, MD    Referring MD: Marylen Ponto, MD   Chief Complaint  Patient presents with  . 1 month follow up  Doing well still gets some shoulder pain but this is reproducible by moving the shoulder  History of Present Illness:    Brandy PAMINTUAN is a 67 y.o. female with dyslipidemia and atypical chest pain.  She also had abnormal EKG rising suspicion for old myocardial infarction.  Luckily her echocardiogram showed preserved left ventricular ejection fraction without segmental wall motion abnormalities.  She did not have stress test done because apparently she said that she cannot walk well on the treadmill.  Past Medical History:  Diagnosis Date  . Anemia    hx of  . Anxiety    hx of  . Arthritis   . Hemorrhoids   . Hyperlipidemia   . Hypertension     Past Surgical History:  Procedure Laterality Date  . APPENDECTOMY     2009  . CHOLECYSTECTOMY     2013  . TOTAL KNEE ARTHROPLASTY Left 10/18/2014   Procedure: LEFT TOTAL KNEE ARTHROPLASTY;  Surgeon: Ollen Gross, MD;  Location: [facility];  Service: Orthopedics;  Laterality: Left;    Current Medications: Current Meds  Medication Sig  . allopurinol (ZYLOPRIM) 100 MG tablet Take 100 mg by mouth daily.  Marland Kitchen ALPRAZolam (XANAX) 0.25 MG tablet Take 0.25 mg by mouth 2 (two) times daily as needed for anxiety.  . clotrimazole-betamethasone (LOTRISONE) cream Apply 1 application topically 2 (two) times daily.  . colchicine 0.6 MG tablet Take 0.6 mg by mouth daily.  . pantoprazole (PROTONIX) 40 MG tablet Take 1 tablet by mouth daily.  . sucralfate (CARAFATE) 1 g tablet Take 1 g by mouth 4 (four) times daily -  with meals and at bedtime.  . valsartan-hydrochlorothiazide (DIOVAN-HCT) 320-12.5 MG per tablet Take 1 tablet by mouth every morning.     Allergies:   Sulfa antibiotics; Levaquin  [levofloxacin in d5w]; and Penicillins   Social History   Socioeconomic History  . Marital status: Married    Spouse name: None  . Number of children: None  . Years of education: None  . Highest education level: None  Social Needs  . Financial resource strain: None  . Food insecurity - worry: None  . Food insecurity - inability: None  . Transportation needs - medical: None  . Transportation needs - non-medical: None  Occupational History  . None  Tobacco Use  . Smoking status: Never Smoker  . Smokeless tobacco: Never Used  Substance and Sexual Activity  . Alcohol use: None    Comment: occasionally  . Drug use: No  . Sexual activity: None  Other Topics Concern  . None  Social History Narrative  . None     Family History: The patient's family history includes Arthritis in her mother; Prostate cancer in her father. ROS:   Please see the history of present illness.    All 14 point review of systems negative except as described per history of present illness  EKGs/Labs/Other Studies Reviewed:      Recent Labs: No results found for requested labs within last 8760 hours.  Recent Lipid Panel    Component Value Date/Time   CHOL 214 (H) 02/13/2017 1710   TRIG 144 02/13/2017 1710   HDL 56  02/13/2017 1710   CHOLHDL 3.8 02/13/2017 1710   LDLCALC 129 (H) 02/13/2017 1710    Physical Exam:    VS:  BP 120/70   Pulse 88   Resp 14   Ht 5\' 4"  (1.626 m)   Wt 153 lb (69.4 kg)   BMI 26.26 kg/m     Wt Readings from Last 3 Encounters:  03/18/17 153 lb (69.4 kg)  02/13/17 150 lb (68 kg)  10/18/14 150 lb (68 kg)     GEN:  Well nourished, well developed in no acute distress HEENT: Normal NECK: No JVD; No carotid bruits LYMPHATICS: No lymphadenopathy CARDIAC: RRR, no murmurs, no rubs, no gallops RESPIRATORY:  Clear to auscultation without rales, wheezing or rhonchi  ABDOMEN: Soft, non-tender, non-distended MUSCULOSKELETAL:  No edema; No deformity  SKIN: Warm and  dry LOWER EXTREMITIES: no swelling NEUROLOGIC:  Alert and oriented x 3 PSYCHIATRIC:  Normal affect   ASSESSMENT:    1. Abnormal EKG   2. Atypical chest pain   3. Dyslipidemia    PLAN:    In order of problems listed above:  1. Abnormal EKG: Echocardiogram shows normal and preserved left ventricular ejection fraction. 2. Atypical chest pain we [facility].  In the meantime we will continue with aspirin and rest of her medications.  I told her she need to let me know if she develop any more chest pain. 3. Dyslipidemia: LDL is 129.  Decision regarding therapy will be made based on results of stress test.   Medication Adjustments/Labs and Tests Ordered: Current medicines are reviewed at length with the patient today.  Concerns regarding medicines are outlined above.  No orders of the defined types were placed in this encounter.  Medication changes: No orders of the defined types were placed in this encounter.   Signed, Georgeanna Leaobert J. Krasowski, MD, [facility] 03/18/2017 4:30 PM    [facility]

## 2017-03-27 ENCOUNTER — Telehealth: Payer: Self-pay | Admitting: Cardiology

## 2017-03-27 NOTE — Telephone Encounter (Signed)
Lurena JoinerRebecca called to say Lynden AngCathy is supposed to have a stress test. [facility] [id number]#[id number] for CPT 93350 and should be [id number].

## 2017-03-27 NOTE — Telephone Encounter (Signed)
Sent message to Authorization team about the testing CPT needing to be change. [facility] said [facility] needed the correct Authorization by 4 pm or they will reschedule.

## 2017-03-28 DIAGNOSIS — R079 Chest pain, unspecified: Secondary | ICD-10-CM | POA: Diagnosis not present

## 2017-03-28 NOTE — Telephone Encounter (Signed)
[facility]

## 2017-04-05 ENCOUNTER — Telehealth: Payer: Self-pay | Admitting: Cardiology

## 2017-04-05 DIAGNOSIS — R072 Precordial pain: Secondary | ICD-10-CM

## 2017-04-08 NOTE — Telephone Encounter (Signed)
Patient aware of normal results

## 2017-04-08 NOTE — Addendum Note (Signed)
Addended by: Arville CareHUNT, Sabiha Sura N on: 04/08/2017 02:03 PM   Modules accepted: Orders

## 2017-06-11 DIAGNOSIS — Z96652 Presence of left artificial knee joint: Secondary | ICD-10-CM

## 2017-06-11 DIAGNOSIS — M25552 Pain in left hip: Secondary | ICD-10-CM

## 2017-06-11 HISTORY — DX: Pain in left hip: M25.552

## 2017-06-11 HISTORY — DX: Presence of left artificial knee joint: Z96.652

## 2017-06-17 ENCOUNTER — Ambulatory Visit: Payer: Medicare Other | Admitting: Cardiology

## 2017-06-17 ENCOUNTER — Encounter: Payer: Self-pay | Admitting: Cardiology

## 2017-06-17 ENCOUNTER — Encounter: Payer: Self-pay | Admitting: *Deleted

## 2017-06-17 VITALS — BP 120/64 | HR 74 | Ht 64.0 in | Wt 151.8 lb

## 2017-06-17 DIAGNOSIS — R9431 Abnormal electrocardiogram [ECG] [EKG]: Secondary | ICD-10-CM

## 2017-06-17 DIAGNOSIS — R0789 Other chest pain: Secondary | ICD-10-CM | POA: Diagnosis not present

## 2017-06-17 DIAGNOSIS — E785 Hyperlipidemia, unspecified: Secondary | ICD-10-CM

## 2017-06-17 NOTE — Progress Notes (Signed)
Cardiology Office Note:    Date:  06/17/2017   ID:  Brandy SaverCathy C Geter, DOB 10/01/1950, MRN [id number]  PCP:  Marylen PontoHolt, Lynley S, MD  Cardiologist:  Gypsy Balsamobert Debbrah Sampedro, MD    Referring MD: Marylen PontoHolt, Lynley S, MD   Chief Complaint  Patient presents with  . Follow-up  Doing well  History of Present Illness:    Brandy Cruz is a 67 y.o. female with abnormal EKG and atypical chest pain.  Eventually ended up doing stress test though it was done in form of Lexiscan since she does have knee problems and difficulty walking stress test was normal and negative for ischemia.  She is asymptomatic still trying to be active she is stationary bike on the regular basis denies having recent chest pain.  Recently had fasting lipid profile done by primary care physician she was told it is good  Past Medical History:  Diagnosis Date  . Anemia    hx of  . Anxiety    hx of  . Arthritis   . Hemorrhoids   . Hyperlipidemia   . Hypertension     Past Surgical History:  Procedure Laterality Date  . APPENDECTOMY     2009  . CHOLECYSTECTOMY     2013  . 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 allopurinol (ZYLOPRIM) 100 MG tablet Take 100 mg by mouth daily.  Marland Kitchen. ALPRAZolam (XANAX) 0.25 MG tablet Take 0.25 mg by mouth 2 (two) times daily as needed for anxiety.  . colchicine 0.6 MG tablet Take 0.6 mg by mouth daily.  . pantoprazole (PROTONIX) 40 MG tablet Take 1 tablet by mouth daily.  . sucralfate (CARAFATE) 1 g tablet Take 1 g by mouth 4 (four) times daily -  with meals and at bedtime.  . valsartan-hydrochlorothiazide (DIOVAN-HCT) 320-12.5 MG per tablet Take 1 tablet by mouth every morning.     Allergies:   Sulfa antibiotics; Levaquin [levofloxacin in d5w]; and Penicillins   Social History   Socioeconomic History  . Marital  status: Married    Spouse name: Not on file  . Number of children: Not on file  . Years of education: Not on file  . Highest education level: Not on file  Occupational History  . Not on file  Social Needs  . Financial resource strain: Not on file  . Food insecurity:    Worry: Not on file    Inability: Not on file  . Transportation needs:    Medical: Not on file    Non-medical: Not on file  Tobacco Use  . Smoking status: Never Smoker  . Smokeless tobacco: Never Used  Substance and Sexual Activity  . Alcohol use: Not on file    Comment: occasionally  . Drug use: No  . Sexual activity: Not on file  Lifestyle  . Physical activity:    Days per week: Not on file    Minutes per session: Not on file  . Stress: Not on file  Relationships  . Social connections:    Talks on phone: Not on file    Gets together: Not on file    Attends religious service: Not on file    Active member of club or organization: Not on file    Attends meetings of clubs or organizations: Not on file    Relationship  status: Not on file  Other Topics Concern  . Not on file  Social History Narrative  . Not on file     Family History: The patient's family history includes Arthritis in her mother; Prostate cancer in her father. ROS:   Please see the history of present illness.    All 14 point review of systems negative except as described per history of present illness  EKGs/Labs/Other Studies Reviewed:      Recent Labs: No results found for requested labs within last 8760 hours.  Recent Lipid Panel    Component Value Date/Time   CHOL 214 (H) 02/13/2017 1710   TRIG 144 02/13/2017 1710   HDL 56 02/13/2017 1710   CHOLHDL 3.8 02/13/2017 1710   LDLCALC 129 (H) 02/13/2017 1710    Physical Exam:    VS:  BP 120/64   Pulse 74   Ht 5\' 4"  (1.626 m)   Wt 151 lb 12.8 oz (68.9 kg)   SpO2 97%   BMI 26.06 kg/m     Wt Readings from Last 3 Encounters:  06/17/17 151 lb 12.8 oz (68.9 kg)  03/18/17 153  lb (69.4 kg)  02/13/17 150 lb (68 kg)     GEN:  Well nourished, well developed in no acute distress HEENT: Normal NECK: No JVD; No carotid bruits LYMPHATICS: No lymphadenopathy CARDIAC: RRR, no murmurs, no rubs, no gallops RESPIRATORY:  Clear to auscultation without rales, wheezing or rhonchi  ABDOMEN: Soft, non-tender, non-distended MUSCULOSKELETAL:  No edema; No deformity  SKIN: Warm and dry LOWER EXTREMITIES: no swelling NEUROLOGIC:  Alert and oriented x 3 PSYCHIATRIC:  Normal affect   ASSESSMENT:    1. Atypical chest pain   2. Dyslipidemia   3. Abnormal EKG    PLAN:    In order of problems listed above:  1. Atypical chest pain: Denies having any stress test negative. 2. Dyslipidemia: I will call primary care physician to get her fasting lipid profile 3. Abnormal EKG echocardiogram and stress test were normal   Medication Adjustments/Labs and Tests Ordered: Current medicines are reviewed at length with the patient today.  Concerns regarding medicines are outlined above.  No orders of the defined types were placed in this encounter.  Medication changes: No orders of the defined types were placed in this encounter.   Signed, Georgeanna Lea, MD, [facility] 06/17/2017 9:11 AM    [facility]

## 2017-06-17 NOTE — Patient Instructions (Signed)

## 2017-08-06 ENCOUNTER — Other Ambulatory Visit: Payer: Self-pay | Admitting: Orthopedic Surgery

## 2017-08-06 DIAGNOSIS — Z96652 Presence of left artificial knee joint: Secondary | ICD-10-CM

## 2017-08-19 ENCOUNTER — Ambulatory Visit
Admission: RE | Admit: 2017-08-19 | Discharge: 2017-08-19 | Disposition: A | Discharge status: Home / Self Care | Payer: Medicare Other | Source: Ambulatory Visit | Attending: Orthopedic Surgery | Admitting: Orthopedic Surgery

## 2017-08-19 DIAGNOSIS — Z96652 Presence of left artificial knee joint: Secondary | ICD-10-CM

## 2018-01-15 ENCOUNTER — Ambulatory Visit: Payer: Medicare Other | Admitting: Cardiology

## 2018-01-15 ENCOUNTER — Encounter: Payer: Self-pay | Admitting: Cardiology

## 2018-01-15 VITALS — BP 128/70 | HR 73 | Ht 64.0 in | Wt 147.2 lb

## 2018-01-15 DIAGNOSIS — E785 Hyperlipidemia, unspecified: Secondary | ICD-10-CM | POA: Diagnosis not present

## 2018-01-15 DIAGNOSIS — M1712 Unilateral primary osteoarthritis, left knee: Secondary | ICD-10-CM

## 2018-01-15 DIAGNOSIS — R0789 Other chest pain: Secondary | ICD-10-CM | POA: Diagnosis not present

## 2018-01-15 NOTE — Progress Notes (Signed)
Cardiology Office Note:    Date:  01/15/2018   ID:  Brandy Cruz, DOB 08/26/1950, MRN [id number]  PCP:  Marylen PontoHolt, Lynley S, MD  Cardiologist:  Gypsy Balsamobert Krasowski, MD    Referring MD: Marylen PontoHolt, Lynley S, MD   Chief Complaint  Patient presents with  . Follow-up  Doing well cardiac wise but I need knee surgery  History of Present Illness:    Brandy Cruz is a 67 y.o. female with atypical chest pain.  She did have a stress test and echocardiogram at the beginning of this year both were negative.  She is getting ready to have left hip surgery done.  Cardiac wise doing well of course exercise capacity is limited because of pain in her knee described to have some heartburn which happen at rest never with exercise.  There is some changes in her medication for her stomach which include ranitidine as well as Nexium that seems to be helping quite well  Past Medical History:  Diagnosis Date  . Anemia    hx of  . Anxiety    hx of  . Arthritis   . Hemorrhoids   . Hyperlipidemia   . Hypertension     Past Surgical History:  Procedure Laterality Date  . APPENDECTOMY     2009  . CHOLECYSTECTOMY     2013  . 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 allopurinol (ZYLOPRIM) 100 MG tablet Take 100 mg by mouth daily.  Marland Kitchen. ALPRAZolam (XANAX) 0.25 MG tablet Take 0.25 mg by mouth 2 (two) times daily as needed for anxiety.  . colchicine 0.6 MG tablet Take 0.6 mg by mouth daily.  Marland Kitchen. esomeprazole (NEXIUM) 40 MG capsule Take 40 mg by mouth 2 (two) times daily.  . ranitidine (ZANTAC) 300 MG tablet Take 300 mg by mouth 2 (two) times daily.   . sucralfate (CARAFATE) 1 g tablet Take 1 g by mouth 4 (four) times daily -  with meals and at bedtime.  . valsartan-hydrochlorothiazide (DIOVAN-HCT) 320-12.5 MG per tablet Take 1 tablet by  mouth every morning.     Allergies:   Sulfa antibiotics; Levaquin [levofloxacin in d5w]; and Penicillins   Social History   Socioeconomic History  . Marital status: Married    Spouse name: Not on file  . Number of children: Not on file  . Years of education: Not on file  . Highest education level: Not on file  Occupational History  . Not on file  Social Needs  . Financial resource strain: Not on file  . Food insecurity:    Worry: Not on file    Inability: Not on file  . Transportation needs:    Medical: Not on file    Non-medical: Not on file  Tobacco Use  . Smoking status: Never Smoker  . Smokeless tobacco: Never Used  Substance and Sexual Activity  . Alcohol use: Not on file    Comment: occasionally  . Drug use: No  . Sexual activity: Not on file  Lifestyle  . Physical activity:    Days per week: Not on file    Minutes per session: Not on file  . Stress: Not on file  Relationships  . Social connections:    Talks on phone: Not on file    Gets together: Not on file  Attends religious service: Not on file    Active member of club or organization: Not on file    Attends meetings of clubs or organizations: Not on file    Relationship status: Not on file  Other Topics Concern  . Not on file  Social History Narrative  . Not on file     Family History: The patient's family history includes Arthritis in her mother; Prostate cancer in her father. ROS:   Please see the history of present illness.    All 14 point review of systems negative except as described per history of present illness  EKGs/Labs/Other Studies Reviewed:      Recent Labs: No results found for requested labs within last 8760 hours.  Recent Lipid Panel    Component Value Date/Time   CHOL 214 (H) 02/13/2017 1710   TRIG 144 02/13/2017 1710   HDL 56 02/13/2017 1710   CHOLHDL 3.8 02/13/2017 1710   LDLCALC 129 (H) 02/13/2017 1710    Physical Exam:    VS:  BP 128/70   Pulse 73   Ht 5\' 4"   (1.626 m)   Wt 147 lb 3.2 oz (66.8 kg)   SpO2 96%   BMI 25.27 kg/m     Wt Readings from Last 3 Encounters:  01/15/18 147 lb 3.2 oz (66.8 kg)  06/17/17 151 lb 12.8 oz (68.9 kg)  03/18/17 153 lb (69.4 kg)     GEN:  Well nourished, well developed in no acute distress HEENT: Normal NECK: No JVD; No carotid bruits LYMPHATICS: No lymphadenopathy CARDIAC: RRR, no murmurs, no rubs, no gallops RESPIRATORY:  Clear to auscultation without rales, wheezing or rhonchi  ABDOMEN: Soft, non-tender, non-distended MUSCULOSKELETAL:  No edema; No deformity  SKIN: Warm and dry LOWER EXTREMITIES: no swelling NEUROLOGIC:  Alert and oriented x 3 PSYCHIATRIC:  Normal affect   ASSESSMENT:    1. Atypical chest pain   2. Dyslipidemia   3. Primary osteoarthritis of left knee    PLAN:    In order of problems listed above:  1. Atypical chest pain stress test echocardiogram negative in April of this year 2. Dyslipidemia doing well from that point we will continue present management. 3. Hip problems she will require hip surgery.  It is fine from my standpoint to be to proceed with surgery and recent testing done were negative.   Medication Adjustments/Labs and Tests Ordered: Current medicines are reviewed at length with the patient today.  Concerns regarding medicines are outlined above.  No orders of the defined types were placed in this encounter.  Medication changes: No orders of the defined types were placed in this encounter.   Signed, Georgeanna Lea, MD, [facility] 01/15/2018 10:34 AM    [facility]

## 2018-01-15 NOTE — Patient Instructions (Signed)
Medication Instructions:  Your physician recommends that you continue on your current medications as directed. Please refer to the Current Medication list given to you today.  If you need a refill on your cardiac medications before your next appointment, please call your pharmacy.   Lab work: None ordered If you have labs (blood work) drawn today and your tests are completely normal, you will receive your results only by: . MyChart Message (if you have MyChart) OR . A paper copy in the mail If you have any lab test that is abnormal or we need to change your treatment, we will call you to review the results.  Testing/Procedures: None ordered  Follow-Up: At [facility], you and your health needs are our priority.  As part of our continuing mission to provide you with exceptional heart care, we have created designated Provider Care Teams.  These Care Teams include your primary Cardiologist (physician) and Advanced Practice Providers (APPs -  Physician Assistants and Nurse Practitioners) who all work together to provide you with the care you need, when you need it. You will need a follow up appointment in 6 months.  Please call our office 2 months in advance to schedule this appointment.  You may see  Robert Krasowski or another member of our [facility] Provider Team in [place]: Brian Munley, MD . Rajan Revankar, MD  Any Other Special Instructions Will Be Listed Below (If Applicable).    

## 2018-02-11 NOTE — H&P (Signed)
TOTAL HIP ADMISSION H&P  Patient is admitted for left total hip arthroplasty.  Subjective:  Chief Complaint: left hip pain  HPI: Brandy Cruz, 67 y.o. female, has a history of pain and functional disability in the left hip(s) due to arthritis and patient has failed non-surgical conservative treatments for greater than 12 weeks to include corticosteriod injections and activity modification.  Onset of symptoms was abrupt starting 7 months ago with rapidlly worsening course since that time.The patient noted no past surgery on the left hip(s).  Patient currently rates pain in the left hip at 8 out of 10 with activity. Patient has worsening of pain with activity and weight bearing, pain that interfers with activities of daily living and crepitus. Patient has evidence of bone-on-bone arthritis by imaging studies. This condition presents safety issues increasing the risk of falls. There is no current active infection.  Patient Active Problem List   Diagnosis Date Noted  . Dyslipidemia 03/18/2017  . Abnormal EKG 02/13/2017  . Atypical chest pain 02/12/2017  . OA (osteoarthritis) of knee 10/18/2014   Past Medical History:  Diagnosis Date  . Anemia    hx of  . Anxiety    hx of  . Arthritis   . Hemorrhoids   . Hyperlipidemia   . Hypertension     Past Surgical History:  Procedure Laterality Date  . APPENDECTOMY     2009  . CHOLECYSTECTOMY     2013  . 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    Current Outpatient Medications  Medication Sig Dispense Refill Last Dose  . allopurinol (ZYLOPRIM) 100 MG tablet Take 100 mg by mouth daily.   Taking  . ALPRAZolam (XANAX) 0.25 MG tablet Take 0.25 mg by mouth 2 (two) times daily as needed for anxiety.   Taking  . colchicine 0.6 MG tablet Take 0.6 mg  by mouth daily.   Taking  . esomeprazole (NEXIUM) 40 MG capsule Take 40 mg by mouth 2 (two) times daily.  12 Taking  . ranitidine (ZANTAC) 300 MG tablet Take 300 mg by mouth 2 (two) times daily.   11 Taking  . sucralfate (CARAFATE) 1 g tablet Take 1 g by mouth 4 (four) times daily -  with meals and at bedtime.   Taking  . valsartan-hydrochlorothiazide (DIOVAN-HCT) 320-12.5 MG per tablet Take 1 tablet by mouth every morning.  12 Taking   Allergies  Allergen Reactions  . Sulfa Antibiotics Hives and Itching    + fever  . Levaquin [Levofloxacin In D5w]     Dizziness and headache  . Penicillins Rash    Childhood allergy.     Social History   Tobacco Use  . Smoking status: Never Smoker  . Smokeless tobacco: Never Used  Substance Use Topics  . Alcohol use: Not on file    Comment: occasionally    Family History  Problem Relation Age of Onset  . Arthritis Mother   . Prostate cancer Father      Review of Systems  Constitutional: Negative for chills and fever.  HENT: Negative for congestion, sore throat and tinnitus.   Eyes: Negative for double vision, photophobia and pain.  Respiratory: Negative for cough, shortness of breath and wheezing.   Cardiovascular: Negative for chest pain, palpitations and orthopnea.  Gastrointestinal: Negative for heartburn, nausea and vomiting.  Genitourinary: Negative for dysuria, frequency and urgency.  Musculoskeletal: Positive for joint pain.  Neurological: Negative for dizziness, weakness and headaches.    Objective:  Physical Exam  Well nourished and well developed.  General: Alert and oriented x3, cooperative and pleasant, no acute distress.  Head: normocephalic, atraumatic, neck supple.  Eyes: EOMI.  Respiratory: breath sounds clear in all fields, no wheezing, rales, or rhonchi. Cardiovascular: Regular rate and rhythm, no murmurs, gallops or rubs.  Abdomen: non-tender to palpation and soft, normoactive bowel sounds. Musculoskeletal: Left  Hip Exam: ROM: Flexion to 95, Internal Rotation is minimal, External Rotation 0, and Abduction 30 degrees. There is tenderness over the greater trochanter bursa. There is no pain on provocative testing of the hip. Calves soft and nontender. Motor function intact in LE. Strength 5/5 LE bilaterally. Neuro: Distal pulses 2+. Sensation to light touch intact in LE.  Vital signs in last 24 hours: Blood pressure: 140/88 mmHg Pulse: 60 bpm  Labs:   Estimated body mass index is 25.27 kg/m as calculated from the following:   Height as of 01/15/18: 5\' 4"  (1.626 m).   Weight as of 01/15/18: 66.8 kg.   Imaging Review Plain radiographs demonstrate severe degenerative joint disease of the left hip(s). The bone quality appears to be adequate for age and reported activity level.  MRI of the left hip shows significant degenerative change with significant effusion. She had plain films in 05/2017 which showed joint space narrowing, but now it is completely bone-on-bone, consistent with rapidly progressing arthritis. She also has a stress reaction in the femoral head-neck.  Preoperative templating of the joint replacement has been completed, documented, and submitted to the Operating Room personnel in order to optimize intra-operative equipment management.     Assessment/Plan:  End stage arthritis, left hip(s)  The patient history, physical examination, clinical judgement of the provider and imaging studies are consistent with end stage degenerative joint disease of the left hip(s) and total hip arthroplasty is deemed medically necessary. The treatment options including medical management, injection therapy, arthroscopy and arthroplasty were discussed at length. The risks and benefits of total hip arthroplasty were presented and reviewed. The risks due to aseptic loosening, infection, stiffness, dislocation/subluxation,  thromboembolic complications and other imponderables were discussed.  The patient  acknowledged the explanation, agreed to proceed with the plan and consent was signed. Patient is being admitted for inpatient treatment for surgery, pain control, PT, OT, prophylactic antibiotics, VTE prophylaxis, progressive ambulation and ADL's and discharge planning.The patient is planning to be discharged home.   Therapy Plans: HEP Disposition: Home with husband Planned DVT Prophylaxis: Xarelto 10 mg daily (hx gastritis) DME needed: None PCP: Juleen ChinaLynley Holt, MD Cardiologist: Gypsy Balsamobert Krasowski, MD TXA: IV Allergies: PCN (rash), sulfa Anesthesia Concerns: None  - Patient was instructed on what medications to stop prior to surgery. - Follow-up visit in 2 weeks with Dr. Lequita HaltAluisio - Begin physical therapy following surgery - Pre-operative lab work as pre-surgical testing - Prescriptions will be provided in hospital at time of discharge  Arther AbbottKristie Jesua Tamblyn, PA-C Orthopedic Surgery [facility] [place]

## 2018-02-21 NOTE — Patient Instructions (Signed)
Brandy Cruz  02/21/2018   Your procedure is scheduled on: 03-05-18   Report to [facility] Main  Entrance     Report to admitting at 1:00PM    Call this number if you have problems the morning of surgery [phone]      Remember: Do not eat food After Midnight. YOU MAY HAVE CLEAR LIQUIDS FROM MIDNIGHT UNTIL 9:30AM. NOTHING BY MOUTH AFTER 9:30AM!  BRUSH YOUR TEETH MORNING OF SURGERY AND RINSE YOUR MOUTH OUT, NO CHEWING GUM CANDY OR MINTS.      CLEAR LIQUID DIET   Foods Allowed                                                                     Foods Excluded  Coffee and tea, regular and decaf                             liquids that you cannot  Plain Jell-O in any flavor                                             see through such as: Fruit ices (not with fruit pulp)                                     milk, soups, orange juice  Iced Popsicles                                    All solid food Carbonated beverages, regular and diet                                    Cranberry, grape and apple juices Sports drinks like Gatorade Lightly seasoned clear broth or consume(fat free) Sugar, honey syrup  Sample Menu Breakfast                                Lunch                                     Supper Cranberry juice                    Beef broth                            Chicken broth Jell-O                                     Grape juice  Apple juice Coffee or tea                        Jell-O                                      Popsicle                                                Coffee or tea                        Coffee or tea  _____________________________________________________________________       Take these medicines the morning of surgery with A SIP OF WATER: Allopurinol, Nexium, xanax if needed, colchicine if needed                                  You may not have any metal on your body including hair  pins and              piercings  Do not wear jewelry, make-up, lotions, powders or perfumes, deodorant             Do not wear nail polish.  Do not shave  48 hours prior to surgery.       Do not bring valuables to the hospital. [facility] IS NOT             RESPONSIBLE   FOR VALUABLES.  Contacts, dentures or bridgework may not be worn into surgery.  Leave suitcase in the car. After surgery it may be brought to your room.                  Please read over the following fact sheets you were given: _____________________________________________________________________             [facility] - Preparing for Surgery Before surgery, you can play an important role.  Because skin is not sterile, your skin needs to be as free of germs as possible.  You can reduce the number of germs on your skin by washing with CHG (chlorahexidine gluconate) soap before surgery.  CHG is an antiseptic cleaner which kills germs and bonds with the skin to continue killing germs even after washing. Please DO NOT use if you have an allergy to CHG or antibacterial soaps.  If your skin becomes reddened/irritated stop using the CHG and inform your nurse when you arrive at Short Stay. Do not shave (including legs and underarms) for at least 48 hours prior to the first CHG shower.  You may shave your face/neck. Please follow these instructions carefully:  1.  Shower with CHG Soap the night before surgery and the  morning of Surgery.  2.  If you choose to wash your hair, wash your hair first as usual with your  normal  shampoo.  3.  After you shampoo, rinse your hair and body thoroughly to remove the  shampoo.                           4.  Use CHG as you would any other liquid soap.  You  can apply chg directly  to the skin and wash                       Gently with a scrungie or clean washcloth.  5.  Apply the CHG Soap to your body ONLY FROM THE NECK DOWN.   Do not use on face/ open                           Wound or open  sores. Avoid contact with eyes, ears mouth and genitals (private parts).                       Wash face,  Genitals (private parts) with your normal soap.             6.  Wash thoroughly, paying special attention to the area where your surgery  will be performed.  7.  Thoroughly rinse your body with warm water from the neck down.  8.  DO NOT shower/wash with your normal soap after using and rinsing off  the CHG Soap.                9.  Pat yourself dry with a clean towel.            10.  Wear clean pajamas.            11.  Place clean sheets on your bed the night of your first shower and do not  sleep with pets. Day of Surgery : Do not apply any lotions/deodorants the morning of surgery.  Please wear clean clothes to the hospital/surgery center.  FAILURE TO FOLLOW THESE INSTRUCTIONS MAY RESULT IN THE CANCELLATION OF YOUR SURGERY PATIENT SIGNATURE_________________________________  NURSE SIGNATURE__________________________________  ________________________________________________________________________   Rogelia MireIncentive Spirometer  An incentive spirometer is a tool that can help keep your lungs clear and active. This tool measures how well you are filling your lungs with each breath. Taking long deep breaths may help reverse or decrease the chance of developing breathing (pulmonary) problems (especially infection) following:  A long period of time when you are unable to move or be active. BEFORE THE PROCEDURE   If the spirometer includes an indicator to show your best effort, your nurse or respiratory therapist will set it to a desired goal.  If possible, sit up straight or lean slightly forward. Try not to slouch.  Hold the incentive spirometer in an upright position. INSTRUCTIONS FOR USE  1. Sit on the edge of your bed if possible, or sit up as far as you can in bed or on a chair. 2. Hold the incentive spirometer in an upright position. 3. Breathe out normally. 4. Place the mouthpiece  in your mouth and seal your lips tightly around it. 5. Breathe in slowly and as deeply as possible, raising the piston or the ball toward the top of the column. 6. Hold your breath for 3-5 seconds or for as long as possible. Allow the piston or ball to fall to the bottom of the column. 7. Remove the mouthpiece from your mouth and breathe out normally. 8. Rest for a few seconds and repeat Steps 1 through 7 at least 10 times every 1-2 hours when you are awake. Take your time and take a few normal breaths between deep breaths. 9. The spirometer may include an indicator to show your best effort. Use the indicator as a goal to work toward  during each repetition. 10. After each set of 10 deep breaths, practice coughing to be sure your lungs are clear. If you have an incision (the cut made at the time of surgery), support your incision when coughing by placing a pillow or rolled up towels firmly against it. Once you are able to get out of bed, walk around indoors and cough well. You may stop using the incentive spirometer when instructed by your caregiver.  RISKS AND COMPLICATIONS  Take your time so you do not get dizzy or light-headed.  If you are in pain, you may need to take or ask for pain medication before doing incentive spirometry. It is harder to take a deep breath if you are having pain. AFTER USE  Rest and breathe slowly and easily.  It can be helpful to keep track of a log of your progress. Your caregiver can provide you with a simple table to help with this. If you are using the spirometer at home, follow these instructions: SEEK MEDICAL CARE IF:   You are having difficultly using the spirometer.  You have trouble using the spirometer as often as instructed.  Your pain medication is not giving enough relief while using the spirometer.  You develop fever of 100.5 F (38.1 C) or higher. SEEK IMMEDIATE MEDICAL CARE IF:   You cough up bloody sputum that had not been present  before.  You develop fever of 102 F (38.9 C) or greater.  You develop worsening pain at or near the incision site. MAKE SURE YOU:   Understand these instructions.  Will watch your condition.  Will get help right away if you are not doing well or get worse. Document Released: 06/25/2006 Document Revised: 05/07/2011 Document Reviewed: 08/26/2006 Exit[employer] Patient Information 2014 [employer], [place].   ________________________________________________________________________  WHAT IS A BLOOD TRANSFUSION? Blood Transfusion Information  A transfusion is the replacement of blood or some of its parts. Blood is made up of multiple cells which provide different functions.  Red blood cells carry oxygen and are used for blood loss replacement.  White blood cells fight against infection.  Platelets control bleeding.  Plasma helps clot blood.  Other blood products are available for specialized needs, such as hemophilia or other clotting disorders. BEFORE THE TRANSFUSION  Who gives blood for transfusions?   Healthy volunteers who are fully evaluated to make sure their blood is safe. This is blood bank blood. Transfusion therapy is the safest it has ever been in the practice of medicine. Before blood is taken from a donor, a complete history is taken to make sure that person has no history of diseases nor engages in risky social behavior (examples are intravenous drug use or sexual activity with multiple partners). The donor's travel history is screened to minimize risk of transmitting infections, such as malaria. The donated blood is tested for signs of infectious diseases, such as HIV and hepatitis. The blood is then tested to be sure it is compatible with you in order to minimize the chance of a transfusion reaction. If you or a relative donates blood, this is often done in anticipation of surgery and is not appropriate for emergency situations. It takes many days to process the donated  blood. RISKS AND COMPLICATIONS Although transfusion therapy is very safe and saves many lives, the main dangers of transfusion include:   Getting an infectious disease.  Developing a transfusion reaction. This is an allergic reaction to something in the blood you were given. Every precaution is taken to  prevent this. The decision to have a blood transfusion has been considered carefully by your caregiver before blood is given. Blood is not given unless the benefits outweigh the risks. AFTER THE TRANSFUSION  Right after receiving a blood transfusion, you will usually feel much better and more energetic. This is especially true if your red blood cells have gotten low (anemic). The transfusion raises the level of the red blood cells which carry oxygen, and this usually causes an energy increase.  The nurse administering the transfusion will monitor you carefully for complications. HOME CARE INSTRUCTIONS  No special instructions are needed after a transfusion. You may find your energy is better. Speak with your caregiver about any limitations on activity for underlying diseases you may have. SEEK MEDICAL CARE IF:   Your condition is not improving after your transfusion.  You develop redness or irritation at the intravenous (IV) site. SEEK IMMEDIATE MEDICAL CARE IF:  Any of the following symptoms occur over the next 12 hours:  Shaking chills.  You have a temperature by mouth above 102 F (38.9 C), not controlled by medicine.  Chest, back, or muscle pain.  People around you feel you are not acting correctly or are confused.  Shortness of breath or difficulty breathing.  Dizziness and fainting.  You get a rash or develop hives.  You have a decrease in urine output.  Your urine turns a dark color or changes to pink, red, or brown. Any of the following symptoms occur over the next 10 days:  You have a temperature by mouth above 102 F (38.9 C), not controlled by  medicine.  Shortness of breath.  Weakness after normal activity.  The white part of the eye turns yellow (jaundice).  You have a decrease in the amount of urine or are urinating less often.  Your urine turns a dark color or changes to pink, red, or brown. Document Released: 02/10/2000 Document Revised: 05/07/2011 Document Reviewed: 09/29/2007 [facility] Patient Information 2014 [place], [place].  _______________________________________________________________________

## 2018-02-21 NOTE — Progress Notes (Signed)
lov / cardiac clearance Dr Bing MatterKrasowski 01-15-18 epic  Medical clearance Dr Juleen ChinaLynley Holt 12-10-17 on chart   ECHO 02-28-17 epic

## 2018-02-24 ENCOUNTER — Encounter (HOSPITAL_COMMUNITY)
Admission: RE | Admit: 2018-02-24 | Discharge: 2018-02-24 | Disposition: A | Discharge status: Home / Self Care | Payer: Medicare Other | Source: Ambulatory Visit | Attending: Orthopedic Surgery | Admitting: Orthopedic Surgery

## 2018-02-24 ENCOUNTER — Encounter (HOSPITAL_COMMUNITY): Payer: Self-pay

## 2018-02-24 ENCOUNTER — Other Ambulatory Visit: Payer: Self-pay

## 2018-02-24 DIAGNOSIS — Z01818 Encounter for other preprocedural examination: Secondary | ICD-10-CM | POA: Insufficient documentation

## 2018-02-24 DIAGNOSIS — I10 Essential (primary) hypertension: Secondary | ICD-10-CM | POA: Diagnosis not present

## 2018-02-24 DIAGNOSIS — R9431 Abnormal electrocardiogram [ECG] [EKG]: Secondary | ICD-10-CM | POA: Insufficient documentation

## 2018-02-24 DIAGNOSIS — M1612 Unilateral primary osteoarthritis, left hip: Secondary | ICD-10-CM | POA: Diagnosis not present

## 2018-02-24 HISTORY — DX: Gastritis, unspecified, without bleeding: K29.70

## 2018-02-24 LAB — CBC
HCT: 39.7 % (ref 36.0–46.0)
Hemoglobin: 13.4 g/dL (ref 12.0–15.0)
MCH: 32.4 pg (ref 26.0–34.0)
MCHC: 33.8 g/dL (ref 30.0–36.0)
MCV: 96.1 fL (ref 80.0–100.0)
Platelets: 330 10*3/uL (ref 150–400)
RBC: 4.13 MIL/uL (ref 3.87–5.11)
RDW: 12.7 % (ref 11.5–15.5)
WBC: 7.3 10*3/uL (ref 4.0–10.5)
nRBC: 0 % (ref 0.0–0.2)

## 2018-02-24 LAB — COMPREHENSIVE METABOLIC PANEL
ALT: 20 U/L (ref 0–44)
AST: 19 U/L (ref 15–41)
Albumin: 4 g/dL (ref 3.5–5.0)
Alkaline Phosphatase: 68 U/L (ref 38–126)
Anion gap: 10 (ref 5–15)
BUN: 11 mg/dL (ref 8–23)
CO2: 29 mmol/L (ref 22–32)
Calcium: 9.2 mg/dL (ref 8.9–10.3)
Chloride: 99 mmol/L (ref 98–111)
Creatinine, Ser: 0.67 mg/dL (ref 0.44–1.00)
GFR calc Af Amer: >60 mL/min (ref >60)
GFR calc non Af Amer: >60 mL/min (ref >60)
Glucose, Bld: 90 mg/dL (ref 70–99)
Potassium: 3.3 mmol/L — ABNORMAL LOW (ref 3.5–5.1)
Sodium: 138 mmol/L (ref 135–145)
Total Bilirubin: 0.8 mg/dL (ref 0.3–1.2)
Total Protein: 7 g/dL (ref 6.5–8.1)

## 2018-02-24 LAB — SURGICAL PCR SCREEN
MRSA, PCR: NEGATIVE
Staphylococcus aureus: NEGATIVE

## 2018-02-24 LAB — PROTIME-INR
INR: 0.96
Prothrombin Time: 12.7 seconds (ref 11.4–15.2)

## 2018-02-24 LAB — APTT: aPTT: 31 seconds (ref 24–36)

## 2018-03-05 ENCOUNTER — Inpatient Hospital Stay (HOSPITAL_COMMUNITY): Payer: Medicare Other | Admitting: Certified Registered Nurse Anesthetist

## 2018-03-05 ENCOUNTER — Encounter (HOSPITAL_COMMUNITY)
Admission: RE | Disposition: A | Discharge status: Home / Self Care | Payer: Self-pay | Source: Home / Self Care | Attending: Orthopedic Surgery

## 2018-03-05 ENCOUNTER — Other Ambulatory Visit: Payer: Self-pay

## 2018-03-05 ENCOUNTER — Encounter (HOSPITAL_COMMUNITY): Payer: Self-pay | Admitting: Emergency Medicine

## 2018-03-05 ENCOUNTER — Inpatient Hospital Stay (HOSPITAL_COMMUNITY): Payer: Medicare Other | Admitting: Physician Assistant

## 2018-03-05 ENCOUNTER — Inpatient Hospital Stay (HOSPITAL_COMMUNITY): Payer: Medicare Other

## 2018-03-05 ENCOUNTER — Inpatient Hospital Stay (HOSPITAL_COMMUNITY)
Admission: RE | Admit: 2018-03-05 | Discharge: 2018-03-06 | DRG: 470 | Disposition: A | Discharge status: Home / Self Care | Payer: Medicare Other | Attending: Orthopedic Surgery | Admitting: Orthopedic Surgery

## 2018-03-05 DIAGNOSIS — Z419 Encounter for procedure for purposes other than remedying health state, unspecified: Secondary | ICD-10-CM

## 2018-03-05 DIAGNOSIS — M1711 Unilateral primary osteoarthritis, right knee: Secondary | ICD-10-CM | POA: Diagnosis present

## 2018-03-05 DIAGNOSIS — M169 Osteoarthritis of hip, unspecified: Secondary | ICD-10-CM

## 2018-03-05 DIAGNOSIS — Z96652 Presence of left artificial knee joint: Secondary | ICD-10-CM | POA: Diagnosis present

## 2018-03-05 DIAGNOSIS — K219 Gastro-esophageal reflux disease without esophagitis: Secondary | ICD-10-CM | POA: Diagnosis present

## 2018-03-05 DIAGNOSIS — Z881 Allergy status to other antibiotic agents status: Secondary | ICD-10-CM

## 2018-03-05 DIAGNOSIS — Z882 Allergy status to sulfonamides status: Secondary | ICD-10-CM | POA: Diagnosis not present

## 2018-03-05 DIAGNOSIS — M25752 Osteophyte, left hip: Secondary | ICD-10-CM | POA: Diagnosis present

## 2018-03-05 DIAGNOSIS — Z79899 Other long term (current) drug therapy: Secondary | ICD-10-CM

## 2018-03-05 DIAGNOSIS — M1610 Unilateral primary osteoarthritis, unspecified hip: Secondary | ICD-10-CM | POA: Diagnosis present

## 2018-03-05 DIAGNOSIS — Z8042 Family history of malignant neoplasm of prostate: Secondary | ICD-10-CM | POA: Diagnosis not present

## 2018-03-05 DIAGNOSIS — Z880 Allergy status to penicillin: Secondary | ICD-10-CM

## 2018-03-05 DIAGNOSIS — Z9049 Acquired absence of other specified parts of digestive tract: Secondary | ICD-10-CM

## 2018-03-05 DIAGNOSIS — M1612 Unilateral primary osteoarthritis, left hip: Principal | ICD-10-CM | POA: Diagnosis present

## 2018-03-05 DIAGNOSIS — E785 Hyperlipidemia, unspecified: Secondary | ICD-10-CM | POA: Diagnosis present

## 2018-03-05 DIAGNOSIS — Z96649 Presence of unspecified artificial hip joint: Secondary | ICD-10-CM

## 2018-03-05 DIAGNOSIS — I10 Essential (primary) hypertension: Secondary | ICD-10-CM | POA: Diagnosis present

## 2018-03-05 DIAGNOSIS — Z8261 Family history of arthritis: Secondary | ICD-10-CM | POA: Diagnosis not present

## 2018-03-05 DIAGNOSIS — F419 Anxiety disorder, unspecified: Secondary | ICD-10-CM | POA: Diagnosis present

## 2018-03-05 HISTORY — PX: TOTAL HIP ARTHROPLASTY: SHX124

## 2018-03-05 LAB — TYPE AND SCREEN
ABO/RH(D): O POS
Antibody Screen: NEGATIVE

## 2018-03-05 SURGERY — ARTHROPLASTY, HIP, TOTAL, ANTERIOR APPROACH
Anesthesia: Monitor Anesthesia Care | Site: Hip | Laterality: Left

## 2018-03-05 MED ORDER — DEXAMETHASONE SODIUM PHOSPHATE 10 MG/ML IJ SOLN
INTRAMUSCULAR | Status: AC
  Filled 2018-03-05: qty 1

## 2018-03-05 MED ORDER — ONDANSETRON HCL 4 MG PO TABS: 4.0000 mg | ORAL_TABLET | Freq: Four times a day (QID) | ORAL | Status: DC | PRN

## 2018-03-05 MED ORDER — SODIUM CHLORIDE 0.9 % IV SOLN
INTRAVENOUS | Status: DC
  Administered 2018-03-05 – 2018-03-06 (×2): via INTRAVENOUS

## 2018-03-05 MED ORDER — MORPHINE SULFATE (PF) 2 MG/ML IV SOLN: 0.5000 mg | INTRAVENOUS | Status: DC | PRN

## 2018-03-05 MED ORDER — FENTANYL CITRATE (PF) 100 MCG/2ML IJ SOLN
25.0000 ug | INTRAMUSCULAR | Status: DC | PRN
  Administered 2018-03-05 (×2): 25 ug via INTRAVENOUS

## 2018-03-05 MED ORDER — ONDANSETRON HCL 4 MG/2ML IJ SOLN
INTRAMUSCULAR | Status: AC
  Filled 2018-03-05: qty 2

## 2018-03-05 MED ORDER — FENTANYL CITRATE (PF) 100 MCG/2ML IJ SOLN
INTRAMUSCULAR | Status: AC
  Filled 2018-03-05: qty 2

## 2018-03-05 MED ORDER — DOCUSATE SODIUM 100 MG PO CAPS
100.0000 mg | ORAL_CAPSULE | Freq: Two times a day (BID) | ORAL | Status: DC
  Administered 2018-03-05 – 2018-03-06 (×2): 100 mg via ORAL
  Filled 2018-03-05 (×2): qty 1

## 2018-03-05 MED ORDER — SUCCINYLCHOLINE CHLORIDE 200 MG/10ML IV SOSY
PREFILLED_SYRINGE | INTRAVENOUS | Status: AC
  Filled 2018-03-05: qty 10

## 2018-03-05 MED ORDER — LACTATED RINGERS IV SOLN
INTRAVENOUS | Status: DC
  Administered 2018-03-05 (×2): via INTRAVENOUS

## 2018-03-05 MED ORDER — HYDROCHLOROTHIAZIDE 12.5 MG PO CAPS
12.5000 mg | ORAL_CAPSULE | Freq: Every day | ORAL | Status: DC
  Administered 2018-03-05 – 2018-03-06 (×2): 12.5 mg via ORAL
  Filled 2018-03-05 (×2): qty 1

## 2018-03-05 MED ORDER — METOCLOPRAMIDE HCL 5 MG PO TABS: 5.0000 mg | ORAL_TABLET | Freq: Three times a day (TID) | ORAL | Status: DC | PRN

## 2018-03-05 MED ORDER — ONDANSETRON HCL 4 MG/2ML IJ SOLN
INTRAMUSCULAR | Status: DC | PRN
  Administered 2018-03-05: 4 mg via INTRAVENOUS

## 2018-03-05 MED ORDER — METHOCARBAMOL 500 MG PO TABS
500.0000 mg | ORAL_TABLET | Freq: Four times a day (QID) | ORAL | Status: DC | PRN
  Administered 2018-03-05: 500 mg via ORAL
  Filled 2018-03-05: qty 1

## 2018-03-05 MED ORDER — TRAMADOL HCL 50 MG PO TABS: 50.0000 mg | ORAL_TABLET | Freq: Four times a day (QID) | ORAL | Status: DC | PRN

## 2018-03-05 MED ORDER — IRBESARTAN 150 MG PO TABS
300.0000 mg | ORAL_TABLET | Freq: Every day | ORAL | Status: DC
  Administered 2018-03-05 – 2018-03-06 (×2): 300 mg via ORAL
  Filled 2018-03-05 (×2): qty 2

## 2018-03-05 MED ORDER — PROPOFOL 10 MG/ML IV BOLUS
INTRAVENOUS | Status: DC | PRN
  Administered 2018-03-05: 20 mg via INTRAVENOUS

## 2018-03-05 MED ORDER — MIDAZOLAM HCL 5 MG/5ML IJ SOLN
INTRAMUSCULAR | Status: DC | PRN
  Administered 2018-03-05: 2 mg via INTRAVENOUS

## 2018-03-05 MED ORDER — BUPIVACAINE-EPINEPHRINE (PF) 0.25% -1:200000 IJ SOLN
INTRAMUSCULAR | Status: DC | PRN
  Administered 2018-03-05: 30 mL

## 2018-03-05 MED ORDER — ACETAMINOPHEN 500 MG PO TABS: 1000.0000 mg | ORAL_TABLET | Freq: Once | ORAL | Status: DC | PRN

## 2018-03-05 MED ORDER — PHENOL 1.4 % MT LIQD: 1.0000 | OROMUCOSAL | Status: DC | PRN

## 2018-03-05 MED ORDER — ACETAMINOPHEN 10 MG/ML IV SOLN
1000.0000 mg | Freq: Once | INTRAVENOUS | Status: DC | PRN
  Administered 2018-03-05: 1000 mg via INTRAVENOUS

## 2018-03-05 MED ORDER — VALSARTAN-HYDROCHLOROTHIAZIDE 320-12.5 MG PO TABS: 1.0000 | ORAL_TABLET | Freq: Every morning | ORAL | Status: DC

## 2018-03-05 MED ORDER — STERILE WATER FOR IRRIGATION IR SOLN
Status: DC | PRN
  Administered 2018-03-05: 2000 mL

## 2018-03-05 MED ORDER — DEXAMETHASONE SODIUM PHOSPHATE 10 MG/ML IJ SOLN: 8.0000 mg | Freq: Once | INTRAMUSCULAR | Status: DC

## 2018-03-05 MED ORDER — METHOCARBAMOL 500 MG IVPB - SIMPLE MED
500.0000 mg | Freq: Four times a day (QID) | INTRAVENOUS | Status: DC | PRN
  Administered 2018-03-05: 500 mg via INTRAVENOUS
  Filled 2018-03-05: qty 50

## 2018-03-05 MED ORDER — OXYCODONE HCL 5 MG PO TABS: 5.0000 mg | ORAL_TABLET | Freq: Once | ORAL | Status: DC | PRN

## 2018-03-05 MED ORDER — DEXAMETHASONE SODIUM PHOSPHATE 10 MG/ML IJ SOLN
INTRAMUSCULAR | Status: DC | PRN
  Administered 2018-03-05: 10 mg via INTRAVENOUS

## 2018-03-05 MED ORDER — METHOCARBAMOL 500 MG IVPB - SIMPLE MED
INTRAVENOUS | Status: AC
  Filled 2018-03-05: qty 50

## 2018-03-05 MED ORDER — ACETAMINOPHEN 160 MG/5ML PO SOLN: 1000.0000 mg | Freq: Once | ORAL | Status: DC | PRN

## 2018-03-05 MED ORDER — BUPIVACAINE IN DEXTROSE 0.75-8.25 % IT SOLN
INTRATHECAL | Status: DC | PRN
  Administered 2018-03-05: 1.8 mL via INTRATHECAL

## 2018-03-05 MED ORDER — PANTOPRAZOLE SODIUM 40 MG PO TBEC
80.0000 mg | DELAYED_RELEASE_TABLET | Freq: Two times a day (BID) | ORAL | Status: DC
  Administered 2018-03-05 – 2018-03-06 (×2): 80 mg via ORAL
  Filled 2018-03-05 (×2): qty 2

## 2018-03-05 MED ORDER — ALPRAZOLAM 0.25 MG PO TABS: 0.2500 mg | ORAL_TABLET | Freq: Two times a day (BID) | ORAL | Status: DC | PRN

## 2018-03-05 MED ORDER — OXYCODONE HCL 5 MG/5ML PO SOLN: 5.0000 mg | Freq: Once | ORAL | Status: DC | PRN

## 2018-03-05 MED ORDER — METOCLOPRAMIDE HCL 5 MG/ML IJ SOLN: 5.0000 mg | Freq: Three times a day (TID) | INTRAMUSCULAR | Status: DC | PRN

## 2018-03-05 MED ORDER — BUPIVACAINE-EPINEPHRINE (PF) 0.25% -1:200000 IJ SOLN
INTRAMUSCULAR | Status: AC
  Filled 2018-03-05: qty 30

## 2018-03-05 MED ORDER — ALLOPURINOL 100 MG PO TABS
100.0000 mg | ORAL_TABLET | Freq: Every day | ORAL | Status: DC
  Administered 2018-03-06: 100 mg via ORAL
  Filled 2018-03-05: qty 1

## 2018-03-05 MED ORDER — ACETAMINOPHEN 10 MG/ML IV SOLN
1000.0000 mg | Freq: Four times a day (QID) | INTRAVENOUS | Status: DC
  Administered 2018-03-05: 1000 mg via INTRAVENOUS
  Filled 2018-03-05: qty 100

## 2018-03-05 MED ORDER — CEFAZOLIN SODIUM-DEXTROSE 2-4 GM/100ML-% IV SOLN
2.0000 g | INTRAVENOUS | Status: AC
  Administered 2018-03-05: 2 g via INTRAVENOUS
  Filled 2018-03-05: qty 100

## 2018-03-05 MED ORDER — BUPIVACAINE HCL (PF) 0.5 % IJ SOLN
INTRAMUSCULAR | Status: AC
  Filled 2018-03-05: qty 30

## 2018-03-05 MED ORDER — ONDANSETRON HCL 4 MG/2ML IJ SOLN: 4.0000 mg | Freq: Four times a day (QID) | INTRAMUSCULAR | Status: DC | PRN

## 2018-03-05 MED ORDER — MENTHOL 3 MG MT LOZG: 1.0000 | LOZENGE | OROMUCOSAL | Status: DC | PRN

## 2018-03-05 MED ORDER — POLYETHYLENE GLYCOL 3350 17 G PO PACK: 17.0000 g | PACK | Freq: Every day | ORAL | Status: DC | PRN

## 2018-03-05 MED ORDER — TRANEXAMIC ACID-NACL 1000-0.7 MG/100ML-% IV SOLN
1000.0000 mg | INTRAVENOUS | Status: AC
  Administered 2018-03-05: 1000 mg via INTRAVENOUS
  Filled 2018-03-05: qty 100

## 2018-03-05 MED ORDER — PHENYLEPHRINE 40 MCG/ML (10ML) SYRINGE FOR IV PUSH (FOR BLOOD PRESSURE SUPPORT)
PREFILLED_SYRINGE | INTRAVENOUS | Status: DC | PRN
  Administered 2018-03-05: 120 ug via INTRAVENOUS

## 2018-03-05 MED ORDER — PHENYLEPHRINE 40 MCG/ML (10ML) SYRINGE FOR IV PUSH (FOR BLOOD PRESSURE SUPPORT)
PREFILLED_SYRINGE | INTRAVENOUS | Status: AC
  Filled 2018-03-05: qty 20

## 2018-03-05 MED ORDER — FENTANYL CITRATE (PF) 100 MCG/2ML IJ SOLN
INTRAMUSCULAR | Status: AC
  Administered 2018-03-05: 25 ug via INTRAVENOUS
  Filled 2018-03-05: qty 2

## 2018-03-05 MED ORDER — DEXAMETHASONE SODIUM PHOSPHATE 10 MG/ML IJ SOLN
10.0000 mg | Freq: Once | INTRAMUSCULAR | Status: AC
  Administered 2018-03-06: 10 mg via INTRAVENOUS
  Filled 2018-03-05: qty 1

## 2018-03-05 MED ORDER — CEFAZOLIN SODIUM-DEXTROSE 2-4 GM/100ML-% IV SOLN
2.0000 g | Freq: Four times a day (QID) | INTRAVENOUS | Status: AC
  Administered 2018-03-05 – 2018-03-06 (×2): 2 g via INTRAVENOUS
  Filled 2018-03-05 (×2): qty 100

## 2018-03-05 MED ORDER — BISACODYL 10 MG RE SUPP: 10.0000 mg | Freq: Every day | RECTAL | Status: DC | PRN

## 2018-03-05 MED ORDER — LIDOCAINE 2% (20 MG/ML) 5 ML SYRINGE
INTRAMUSCULAR | Status: AC
  Filled 2018-03-05: qty 5

## 2018-03-05 MED ORDER — SUCRALFATE 1 G PO TABS
1.0000 g | ORAL_TABLET | Freq: Two times a day (BID) | ORAL | Status: DC
  Administered 2018-03-05: 1 g via ORAL
  Filled 2018-03-05 (×2): qty 1

## 2018-03-05 MED ORDER — HYDROCODONE-ACETAMINOPHEN 5-325 MG PO TABS
1.0000 | ORAL_TABLET | ORAL | Status: DC | PRN
  Administered 2018-03-05: 2 via ORAL
  Administered 2018-03-05: 1 via ORAL
  Administered 2018-03-06 (×2): 2 via ORAL
  Filled 2018-03-05 (×2): qty 2
  Filled 2018-03-05: qty 1
  Filled 2018-03-05: qty 2

## 2018-03-05 MED ORDER — TRANEXAMIC ACID-NACL 1000-0.7 MG/100ML-% IV SOLN
1000.0000 mg | Freq: Once | INTRAVENOUS | Status: AC
  Administered 2018-03-05: 1000 mg via INTRAVENOUS
  Filled 2018-03-05: qty 100

## 2018-03-05 MED ORDER — PROPOFOL 500 MG/50ML IV EMUL
INTRAVENOUS | Status: DC | PRN
  Administered 2018-03-05: 100 ug/kg/min via INTRAVENOUS

## 2018-03-05 MED ORDER — FLEET ENEMA 7-19 GM/118ML RE ENEM: 1.0000 | ENEMA | Freq: Once | RECTAL | Status: DC | PRN

## 2018-03-05 MED ORDER — RIVAROXABAN 10 MG PO TABS
10.0000 mg | ORAL_TABLET | Freq: Every day | ORAL | Status: DC
  Administered 2018-03-06: 10 mg via ORAL
  Filled 2018-03-05: qty 1

## 2018-03-05 MED ORDER — COLCHICINE 0.6 MG PO TABS: 0.6000 mg | ORAL_TABLET | Freq: Every day | ORAL | Status: DC | PRN

## 2018-03-05 MED ORDER — PROPOFOL 10 MG/ML IV BOLUS
INTRAVENOUS | Status: AC
  Filled 2018-03-05: qty 40

## 2018-03-05 MED ORDER — DIPHENHYDRAMINE HCL 12.5 MG/5ML PO ELIX: 12.5000 mg | ORAL_SOLUTION | ORAL | Status: DC | PRN

## 2018-03-05 MED ORDER — MIDAZOLAM HCL 2 MG/2ML IJ SOLN
INTRAMUSCULAR | Status: AC
  Filled 2018-03-05: qty 2

## 2018-03-05 MED ORDER — ACETAMINOPHEN 500 MG PO TABS
500.0000 mg | ORAL_TABLET | Freq: Four times a day (QID) | ORAL | Status: DC
  Filled 2018-03-05: qty 1

## 2018-03-05 MED ORDER — CHLORHEXIDINE GLUCONATE 4 % EX LIQD: 60.0000 mL | Freq: Once | CUTANEOUS | Status: DC

## 2018-03-05 MED ORDER — 0.9 % SODIUM CHLORIDE (POUR BTL) OPTIME
TOPICAL | Status: DC | PRN
  Administered 2018-03-05: 1000 mL

## 2018-03-05 MED ORDER — FAMOTIDINE 20 MG PO TABS
20.0000 mg | ORAL_TABLET | Freq: Every day | ORAL | Status: DC
  Administered 2018-03-05: 20 mg via ORAL
  Filled 2018-03-05: qty 1

## 2018-03-05 SURGICAL SUPPLY — 42 items
BAG DECANTER FOR FLEXI CONT (MISCELLANEOUS) ×2 IMPLANT
BAG SPEC THK2 15X12 ZIP CLS (MISCELLANEOUS)
BAG ZIPLOCK 12X15 (MISCELLANEOUS) IMPLANT
BLADE SAG 18X100X1.27 (BLADE) ×2 IMPLANT
BLADE SURG SZ10 CARB STEEL (BLADE) ×4 IMPLANT
COVER PERINEAL POST (MISCELLANEOUS) ×2 IMPLANT
COVER SURGICAL LIGHT HANDLE (MISCELLANEOUS) ×2 IMPLANT
COVER WAND RF STERILE (DRAPES) ×1 IMPLANT
CUP ACETBLR 48 OD SECTOR II (Hips) ×1 IMPLANT
DECANTER SPIKE VIAL GLASS SM (MISCELLANEOUS) ×2 IMPLANT
DRAPE STERI IOBAN 125X83 (DRAPES) ×2 IMPLANT
DRAPE U-SHAPE 47X51 STRL (DRAPES) ×4 IMPLANT
DRSG ADAPTIC 3X8 NADH LF (GAUZE/BANDAGES/DRESSINGS) ×2 IMPLANT
DRSG MEPILEX BORDER 4X4 (GAUZE/BANDAGES/DRESSINGS) ×2 IMPLANT
DRSG MEPILEX BORDER 4X8 (GAUZE/BANDAGES/DRESSINGS) ×2 IMPLANT
DURAPREP 26ML APPLICATOR (WOUND CARE) ×2 IMPLANT
ELECT REM PT RETURN 15FT ADLT (MISCELLANEOUS) ×2 IMPLANT
EVACUATOR 1/8 PVC DRAIN (DRAIN) ×2 IMPLANT
GLOVE BIO SURGEON STRL SZ7 (GLOVE) ×2 IMPLANT
GLOVE BIO SURGEON STRL SZ8 (GLOVE) ×2 IMPLANT
GLOVE BIOGEL PI IND STRL 7.0 (GLOVE) ×1 IMPLANT
GLOVE BIOGEL PI IND STRL 8 (GLOVE) ×1 IMPLANT
GLOVE BIOGEL PI INDICATOR 7.0 (GLOVE) ×1
GLOVE BIOGEL PI INDICATOR 8 (GLOVE) ×1
GOWN STRL REUS W/TWL LRG LVL3 (GOWN DISPOSABLE) ×2 IMPLANT
GOWN STRL REUS W/TWL XL LVL3 (GOWN DISPOSABLE) ×2 IMPLANT
HEAD CERAMIC DELTA 28 P1.5 HIP (Head) ×1 IMPLANT
HOLDER FOLEY CATH W/STRAP (MISCELLANEOUS) ×2 IMPLANT
LINER MARATHON 28 48 (Hips) ×1 IMPLANT
MANIFOLD NEPTUNE II (INSTRUMENTS) ×2 IMPLANT
PACK ANTERIOR HIP CUSTOM (KITS) ×2 IMPLANT
STEM FEM SZ3 STD ACTIS (Stem) ×1 IMPLANT
STRIP CLOSURE SKIN 1/2X4 (GAUZE/BANDAGES/DRESSINGS) ×2 IMPLANT
SUT ETHIBOND NAB CT1 #1 30IN (SUTURE) ×2 IMPLANT
SUT MNCRL AB 4-0 PS2 18 (SUTURE) ×2 IMPLANT
SUT STRATAFIX 0 PDS 27 VIOLET (SUTURE) ×2
SUT VIC AB 2-0 CT1 27 (SUTURE) ×4
SUT VIC AB 2-0 CT1 TAPERPNT 27 (SUTURE) ×2 IMPLANT
SUTURE STRATFX 0 PDS 27 VIOLET (SUTURE) ×1 IMPLANT
SYR 50ML LL SCALE MARK (SYRINGE) IMPLANT
TRAY FOLEY MTR SLVR 16FR STAT (SET/KITS/TRAYS/PACK) ×2 IMPLANT
YANKAUER SUCT BULB TIP 10FT TU (MISCELLANEOUS) ×2 IMPLANT

## 2018-03-05 NOTE — Anesthesia Procedure Notes (Signed)
Spinal  Patient location during procedure: OR Start time: 03/05/2018 1:43 PM Staffing Resident/CRNA: [place], Stephanie C, CRNA Performed: resident/CRNA  Preanesthetic Checklist Completed: patient identified, site marked, surgical consent, pre-op evaluation, timeout performed, IV checked, risks and benefits discussed and monitors and equipment checked Spinal Block Patient position: sitting Prep: site prepped and draped and DuraPrep Patient monitoring: heart rate, continuous pulse ox and blood pressure Approach: midline Location: L3-4 Injection technique: single-shot Needle Needle type: Pencan  Needle gauge: 25 G Needle length: 9 cm Assessment Sensory level: T6 Additional Notes Expiration date of kit checked and confirmed. Patient tolerated procedure well, without complications.       

## 2018-03-05 NOTE — Transfer of Care (Signed)
Immediate Anesthesia Transfer of Care Note  Patient: Brandy Cruz  Procedure(s) Performed: TOTAL HIP ARTHROPLASTY ANTERIOR APPROACH (Left Hip)  Patient Location: PACU  Anesthesia Type:Spinal  Level of Consciousness: awake, alert  and oriented  Airway & Oxygen Therapy: Patient Spontanous Breathing and Patient connected to face mask oxygen  Post-op Assessment: Report given to RN and Post -op Vital signs reviewed and stable  Post vital signs: Reviewed and stable  Last Vitals:  Vitals Value Taken Time  BP 117/59 03/05/2018  3:30 PM  Temp 36.3 C 03/05/2018  3:24 PM  Pulse 66 03/05/2018  3:34 PM  Resp 18 03/05/2018  3:34 PM  SpO2 100 % 03/05/2018  3:34 PM  Vitals shown include unvalidated device data.  Last Pain:  Vitals:   03/05/18 1524  TempSrc:   PainSc: 0-No pain      Patients Stated Pain Goal: 4 (03/05/18 1226)  Complications: No apparent anesthesia complications

## 2018-03-05 NOTE — Discharge Instructions (Addendum)
°Dr. Frank Aluisio °Total Joint Specialist °[facility] °[address] °[place], [place] [address] °[phone] ° °ANTERIOR APPROACH TOTAL HIP REPLACEMENT POSTOPERATIVE DIRECTIONS ° ° °Hip Rehabilitation, Guidelines Following Surgery  °The results of a hip operation are greatly improved after range of motion and muscle strengthening exercises. Follow all safety measures which are given to protect your hip. If any of these exercises cause increased pain or swelling in your joint, decrease the amount until you are comfortable again. Then slowly increase the exercises. Call your caregiver if you have problems or questions.  ° °HOME CARE INSTRUCTIONS  °• Remove items at home which could result in a fall. This includes throw rugs or furniture in walking pathways.  °· ICE to the affected hip every three hours for 30 minutes at a time and then as needed for pain and swelling.  Continue to use ice on the hip for pain and swelling from surgery. You may notice swelling that will progress down to the foot and ankle.  This is normal after surgery.  Elevate the leg when you are not up walking on it.   °· Continue to use the breathing machine which will help keep your temperature down.  It is common for your temperature to cycle up and down following surgery, especially at night when you are not up moving around and exerting yourself.  The breathing machine keeps your lungs expanded and your temperature down. ° °DIET °You may resume your previous home diet once your are discharged from the hospital. ° °DRESSING / WOUND CARE / SHOWERING °You may shower 3 days after surgery, but keep the wounds dry during showering.  You may use an occlusive plastic wrap (Press'n Seal for example), NO SOAKING/SUBMERGING IN THE BATHTUB.  If the bandage gets wet, change with a clean dry gauze.  If the incision gets wet, pat the wound dry with a clean towel. °You may start showering once you are discharged home but do not submerge the  incision under water. Just pat the incision dry and apply a dry gauze dressing on daily. °Change the surgical dressing daily and reapply a dry dressing each time. ° °ACTIVITY °Walk with your walker as instructed. °Use walker as long as suggested by your caregivers. °Avoid periods of inactivity such as sitting longer than an hour when not asleep. This helps prevent blood clots.  °You may resume a sexual relationship in one month or when given the OK by your doctor.  °You may return to work once you are cleared by your doctor.  °Do not drive a car for 6 weeks or until released by you surgeon.  °Do not drive while taking narcotics. ° °WEIGHT BEARING °Weight bearing as tolerated with assist device (walker, cane, etc) as directed, use it as long as suggested by your surgeon or therapist, typically at least 4-6 weeks. ° °POSTOPERATIVE CONSTIPATION PROTOCOL °Constipation - defined medically as fewer than three stools per week and severe constipation as less than one stool per week. ° °One of the most common issues patients have following surgery is constipation.  Even if you have a regular bowel pattern at home, your normal regimen is likely to be disrupted due to multiple reasons following surgery.  Combination of anesthesia, postoperative narcotics, change in appetite and fluid intake all can affect your bowels.  In order to avoid complications following surgery, here are some recommendations in order to help you during your recovery period. ° °Colace (docusate) - Pick up an over-the-counter form   of Colace or another stool softener and take twice a day as long as you are requiring postoperative pain medications.  Take with a full glass of water daily.  If you experience loose stools or diarrhea, hold the colace until you stool forms back up.  If your symptoms do not get better within 1 week or if they get worse, check with your doctor. ° °Dulcolax (bisacodyl) - Pick up over-the-counter and take as directed by the product  packaging as needed to assist with the movement of your bowels.  Take with a full glass of water.  Use this product as needed if not relieved by Colace only.  ° °MiraLax (polyethylene glycol) - Pick up over-the-counter to have on hand.  MiraLax is a solution that will increase the amount of water in your bowels to assist with bowel movements.  Take as directed and can mix with a glass of water, juice, soda, coffee, or tea.  Take if you go more than two days without a movement. °Do not use MiraLax more than once per day. Call your doctor if you are still constipated or irregular after using this medication for 7 days in a row. ° °If you continue to have problems with postoperative constipation, please contact the office for further assistance and recommendations.  If you experience "the worst abdominal pain ever" or develop nausea or vomiting, please contact the office immediatly for further recommendations for treatment. ° °ITCHING ° If you experience itching with your medications, try taking only a single pain pill, or even half a pain pill at a time.  You can also use Benadryl over the counter for itching or also to help with sleep.  ° °TED HOSE STOCKINGS °Wear the elastic stockings on both legs for three weeks following surgery during the day but you may remove then at night for sleeping. ° °MEDICATIONS °See your medication summary on the “After Visit Summary” that the nursing staff will review with you prior to discharge.  You may have some home medications which will be placed on hold until you complete the course of blood thinner medication.  It is important for you to complete the blood thinner medication as prescribed by your surgeon.  Continue your approved medications as instructed at time of discharge. ° °PRECAUTIONS °If you experience chest pain or shortness of breath - call 911 immediately for transfer to the hospital emergency department.  °If you develop a fever greater that 101 F, purulent drainage  from wound, increased redness or drainage from wound, foul odor from the wound/dressing, or calf pain - CONTACT YOUR SURGEON.   °                                                °FOLLOW-UP APPOINTMENTS °Make sure you keep all of your appointments after your operation with your surgeon and caregivers. You should call the office at the above phone number and make an appointment for approximately two weeks after the date of your surgery or on the date instructed by your surgeon outlined in the "After Visit Summary". ° °RANGE OF MOTION AND STRENGTHENING EXERCISES  °These exercises are designed to help you keep full movement of your hip joint. Follow your caregiver's or physical therapist's instructions. Perform all exercises about fifteen times, three times per day or as directed. Exercise both hips, even if you have   had only one joint replacement. These exercises can be done on a training (exercise) mat, on the floor, on a table or on a bed. Use whatever works the best and is most comfortable for you. Use music or television while you are exercising so that the exercises are a pleasant break in your day. This will make your life better with the exercises acting as a break in routine you can look forward to.  °• Lying on your back, slowly slide your foot toward your buttocks, raising your knee up off the floor. Then slowly slide your foot back down until your leg is straight again.  °• Lying on your back spread your legs as far apart as you can without causing discomfort.  °• Lying on your side, raise your upper leg and foot straight up from the floor as far as is comfortable. Slowly lower the leg and repeat.  °• Lying on your back, tighten up the muscle in the front of your thigh (quadriceps muscles). You can do this by keeping your leg straight and trying to raise your heel off the floor. This helps strengthen the largest muscle supporting your knee.  °• Lying on your back, tighten up the muscles of your buttocks both  with the legs straight and with the knee bent at a comfortable angle while keeping your heel on the floor.  ° °IF YOU ARE TRANSFERRED TO A SKILLED REHAB FACILITY °If the patient is transferred to a skilled rehab facility following release from the hospital, a list of the current medications will be sent to the facility for the patient to continue.  When discharged from the skilled rehab facility, please have the facility set up the patient's Home Health Physical Therapy prior to being released. Also, the skilled facility will be responsible for providing the patient with their medications at time of release from the facility to include their pain medication, the muscle relaxants, and their blood thinner medication. If the patient is still at the rehab facility at time of the two week follow up appointment, the skilled rehab facility will also need to assist the patient in arranging follow up appointment in our office and any transportation needs. ° °MAKE SURE YOU:  °• Understand these instructions.  °• Get help right away if you are not doing well or get worse.  ° ° °Pick up stool softner and laxative for home use following surgery while on pain medications. °Do not submerge incision under water. °Please use good hand washing techniques while changing dressing each day. °May shower starting three days after surgery. °Please use a clean towel to pat the incision dry following showers. °Continue to use ice for pain and swelling after surgery. °Do not use any lotions or creams on the incision until instructed by your surgeon. ° °Information on my medicine - XARELTO® (Rivaroxaban) ° °Why was Xarelto® prescribed for you? °Xarelto® was prescribed for you to reduce the risk of blood clots forming after orthopedic surgery. The medical term for these abnormal blood clots is venous thromboembolism (VTE). ° °What do you need to know about xarelto® ? °Take your Xarelto® ONCE DAILY at the same time every day. °You may take it  either with or without food. ° °If you have difficulty swallowing the tablet whole, you may crush it and mix in applesauce just prior to taking your dose. ° °Take Xarelto® exactly as prescribed by your doctor and DO NOT stop taking Xarelto® without talking to the doctor who prescribed the medication.    Stopping without other VTE prevention medication to take the place of Xarelto® may increase your risk of developing a clot. ° °After discharge, you should have regular check-up appointments with your healthcare provider that is prescribing your Xarelto®.   ° °What do you do if you miss a dose? °If you miss a dose, take it as soon as you remember on the same day then continue your regularly scheduled once daily regimen the next day. Do not take two doses of Xarelto® on the same day.  ° °Important Safety Information °A possible side effect of Xarelto® is bleeding. You should call your healthcare provider right away if you experience any of the following: °? Bleeding from an injury or your nose that does not stop. °? Unusual colored urine (red or dark brown) or unusual colored stools (red or black). °? Unusual bruising for unknown reasons. °? A serious fall or if you hit your head (even if there is no bleeding). ° °Some medicines may interact with Xarelto® and might increase your risk of bleeding while on Xarelto®. To help avoid this, consult your healthcare provider or pharmacist prior to using any new prescription or non-prescription medications, including herbals, vitamins, non-steroidal anti-inflammatory drugs (NSAIDs) and supplements. ° °This website has more information on Xarelto®: www.xarelto.com. ° ° ° °

## 2018-03-05 NOTE — Evaluation (Signed)
Physical Therapy Evaluation Patient Details Name: Brandy SaverCathy C Foley MRN: [id number] DOB: 10/03/1950 Today's Date: 03/05/2018   History of Present Illness  68 yo female s/p L DA-THA on 03/05/18. PMH includes OA with L TKR in 2016, anemia, anxiety, HLD, HTN.   Clinical Impression  Pt presents with L hip pain, increased time and effort to perform mobility tasks, post-surgical L hip weakness, and difficulty ambulating due to pain. Pt to benefit from acute PT to address deficits. Pt ambulated 50 ft with RW with min guard assist, verbal cuing provided throughout. Pt educated on ankle pumps (20/hour) to perform this afternoon/evening to increase circulation, to pt's tolerance and limited by pain. PT to progress mobility as tolerated, and will continue to follow acutely.        Follow Up Recommendations Follow surgeon's recommendation for DC plan and follow-up therapies;Supervision for mobility/OOB(HEP )    Equipment Recommendations  None recommended by PT    Recommendations for Other Services       Precautions / Restrictions Precautions Precautions: Fall Restrictions Weight Bearing Restrictions: No Other Position/Activity Restrictions: WBAT       Mobility  Bed Mobility Overal bed mobility: Needs Assistance Bed Mobility: Supine to Sit     Supine to sit: Min guard;HOB elevated     General bed mobility comments: Min guard for safety. increased effort to perform.   Transfers Overall transfer level: Needs assistance Equipment used: Rolling walker (2 wheeled) Transfers: Sit to/from Stand Sit to Stand: Min guard;From elevated surface         General transfer comment: Min guard for safety. Verbal and tactile cuing for hand placement when rising from bed.   Ambulation/Gait Ambulation/Gait assistance: Min guard Gait Distance (Feet): 50 Feet Assistive device: Rolling walker (2 wheeled) Gait Pattern/deviations: Step-to pattern;Decreased stance time - left;Decreased weight shift to  left;Antalgic Gait velocity: decr    General Gait Details: Min guard for safety. Verbal cuing for sequencing, placement in RW, turning.   Stairs            Wheelchair Mobility    Modified Rankin (Stroke Patients Only)       Balance Overall balance assessment: Mild deficits observed, not formally tested                                           Pertinent Vitals/Pain Pain Assessment: 0-10 Pain Score: 3  Pain Location: L hip  Pain Descriptors / Indicators: Throbbing Pain Intervention(s): Limited activity within patient's tolerance;Repositioned;Ice applied;Monitored during session;Patient requesting pain meds-RN notified    Home Living Family/patient expects to be discharged to:: Private residence Living Arrangements: Spouse/significant other Available Help at Discharge: Family;Available 24 hours/day Type of Home: House Home Access: Stairs to enter Entrance Stairs-Rails: None Entrance Stairs-Number of Steps: 1 - high step  Home Layout: One level Home Equipment: Cane - single point;Bedside commode;Walker - 2 wheels      Prior Function Level of Independence: Independent with assistive device(s)         Comments: Pt with occasional cane use      Hand Dominance   Dominant Hand: Right    Extremity/Trunk Assessment   Upper Extremity Assessment Upper Extremity Assessment: Overall WFL for tasks assessed    Lower Extremity Assessment Lower Extremity Assessment: Overall WFL for tasks assessed;LLE deficits/detail LLE Deficits / Details: suspected post-surgical hip weakness; able to perform ankle pumps, quad sets, SLR without  assist LLE Sensation: WNL    Cervical / Trunk Assessment Cervical / Trunk Assessment: Normal  Communication   Communication: No difficulties  Cognition Arousal/Alertness: Awake/alert Behavior During Therapy: WFL for tasks assessed/performed Overall Cognitive Status: Within Functional Limits for tasks assessed                                         General Comments      Exercises     Assessment/Plan    PT Assessment Patient needs continued PT services  PT Problem List Decreased strength;Pain;Decreased activity tolerance;Decreased knowledge of use of DME;Decreased balance;Decreased mobility       PT Treatment Interventions DME instruction;Therapeutic activities;Gait training;Therapeutic exercise;Patient/family education;Stair training;Balance training;Functional mobility training    PT Goals (Current goals can be found in the Care Plan section)  Acute Rehab PT Goals Patient Stated Goal: decrease pain, walk without limp  PT Goal Formulation: With patient Time For Goal Achievement: 03/12/18 Potential to Achieve Goals: Good    Frequency 7X/week   Barriers to discharge        Co-evaluation               AM-PAC PT "6 Clicks" Mobility  Outcome Measure Help needed turning from your back to your side while in a flat bed without using bedrails?: A Little Help needed moving from lying on your back to sitting on the side of a flat bed without using bedrails?: A Little Help needed moving to and from a bed to a chair (including a wheelchair)?: A Little Help needed standing up from a chair using your arms (e.g., wheelchair or bedside chair)?: A Little Help needed to walk in hospital room?: A Little Help needed climbing 3-5 steps with a railing? : A Little 6 Click Score: 18    End of Session Equipment Utilized During Treatment: Gait belt Activity Tolerance: Patient tolerated treatment well Patient left: in chair;with chair alarm set;with call bell/phone within reach;with family/visitor present;with SCD's reapplied Nurse Communication: Mobility status;Patient requests pain meds PT Visit Diagnosis: Other abnormalities of gait and mobility (R26.89);Difficulty in walking, not elsewhere classified (R26.2)    Time: 5465-6812 PT Time Calculation (min) (ACUTE ONLY): 17  min   Charges:   PT Evaluation $PT Eval Low Complexity: 1 Low          Nicola Police, PT Acute Rehabilitation Services Pager [phone]  Office [phone]   Tyrone Apple D Despina Hidden 03/05/2018, 7:39 PM

## 2018-03-05 NOTE — Op Note (Signed)
OPERATIVE REPORT- TOTAL HIP ARTHROPLASTY   PREOPERATIVE DIAGNOSIS: Osteoarthritis of the Left hip.   POSTOPERATIVE DIAGNOSIS: Osteoarthritis of the Left  hip.   PROCEDURE: Left  total hip arthroplasty, anterior approach.   SURGEON: Ollen Gross, MD   ASSISTANT: Dennie Bible, PA-C  ANESTHESIA:  Spinal  ESTIMATED BLOOD LOSS:-200 mL    DRAINS: Hemovac x1.   COMPLICATIONS: None   CONDITION: PACU - hemodynamically stable.   BRIEF CLINICAL NOTE: Brandy Cruz is a 68 y.o. female who has advanced end-  stage arthritis of their Left  hip with progressively worsening pain and  dysfunction.The patient has failed nonoperative management and presents for  total hip arthroplasty.   PROCEDURE IN DETAIL: After successful administration of spinal  anesthetic, the traction boots for the [facility] bed were placed on both  feet and the patient was placed onto the [facility] bed, boots placed into the leg  holders. The Left hip was then isolated from the perineum with plastic  drapes and prepped and draped in the usual sterile fashion. ASIS and  greater trochanter were marked and a oblique incision was made, starting  at about 1 cm lateral and 2 cm distal to the ASIS and coursing towards  the anterior cortex of the femur. The skin was cut with a 10 blade  through subcutaneous tissue to the level of the fascia overlying the  tensor fascia lata muscle. The fascia was then incised in line with the  incision at the junction of the anterior third and posterior 2/3rd. The  muscle was teased off the fascia and then the interval between the TFL  and the rectus was developed. The Hohmann retractor was then placed at  the top of the femoral neck over the capsule. The vessels overlying the  capsule were cauterized and the fat on top of the capsule was removed.  A Hohmann retractor was then placed anterior underneath the rectus  femoris to give exposure to the entire anterior capsule. A T-shaped   capsulotomy was performed. The edges were tagged and the femoral head  was identified.       Osteophytes are removed off the superior acetabulum.  The femoral neck was then cut in situ with an oscillating saw. Traction  was then applied to the left lower extremity utilizing the [facility]  traction. The femoral head was then removed. Retractors were placed  around the acetabulum and then circumferential removal of the labrum was  performed. Osteophytes were also removed. Reaming starts at 45 mm to  medialize and  Increased in 2 mm increments to 47 mm. We reamed in  approximately 40 degrees of abduction, 20 degrees anteversion. A 48 mm  pinnacle acetabular shell was then impacted in anatomic position under  fluoroscopic guidance with excellent purchase. We did not need to place  any additional dome screws. A 28 mm neutral + 4 marathon liner was then  placed into the acetabular shell.       The femoral lift was then placed along the lateral aspect of the femur  just distal to the vastus ridge. The leg was  externally rotated and capsule  was stripped off the inferior aspect of the femoral neck down to the  level of the lesser trochanter, this was done with electrocautery. The femur was lifted after this was performed. The  leg was then placed in an extended and adducted position essentially delivering the femur. We also removed the capsule superiorly and the piriformis from the  piriformis fossa to gain excellent exposure of the  proximal femur. Rongeur was used to remove some cancellous bone to get  into the lateral portion of the proximal femur for placement of the  initial starter reamer. The starter broaches was placed  the starter broach  and was shown to go down the center of the canal. Broaching  with the Actis system was then performed starting at size 0  coursing  Up to size 3. A size 3 had excellent torsional and rotational  and axial stability. The trial standard offset neck was then  placed  with a 28 + 1.5 trial head. The hip was then reduced. We confirmed that  the stem was in the canal both on AP and lateral x-rays. It also has excellent sizing. The hip was reduced with outstanding stability through full extension and full external rotation.. AP pelvis was taken and the leg lengths were measured and found to be equal. Hip was then dislocated again and the femoral head and neck removed. The  femoral broach was removed. Size 3 Actis stem with a standard offset  neck was then impacted into the femur following native anteversion. Has  excellent purchase in the canal. Excellent torsional and rotational and  axial stability. It is confirmed to be in the canal on AP and lateral  fluoroscopic views. The 28 + 1.5 ceramic head was placed and the hip  reduced with outstanding stability. Again AP pelvis was taken and it  confirmed that the leg lengths were equal. The wound was then copiously  irrigated with saline solution and the capsule reattached and repaired  with Ethibond suture. 30 ml of .25% Bupivicaine was  injected into the capsule and into the edge of the tensor fascia lata as well as subcutaneous tissue. The fascia overlying the tensor fascia lata was then closed with a running #1 V-Loc. Subcu was closed with interrupted 2-0 Vicryl and subcuticular running 4-0 Monocryl. Incision was cleaned  and dried. Steri-Strips and a bulky sterile dressing applied. Hemovac  drain was hooked to suction and then the patient was awakened and transported to  recovery in stable condition.        Please note that a surgical assistant was a medical necessity for this procedure to perform it in a safe and expeditious manner. Assistant was necessary to provide appropriate retraction of vital neurovascular structures and to prevent femoral fracture and allow for anatomic placement of the prosthesis.  Ollen Gross, M.D.

## 2018-03-05 NOTE — Anesthesia Preprocedure Evaluation (Addendum)
Anesthesia Evaluation  Patient identified by MRN, date of birth, ID band Patient awake    Reviewed: Allergy & Precautions, NPO status , Patient's Chart, lab work & pertinent test results  History of Anesthesia Complications Negative for: history of anesthetic complications  Airway Mallampati: II  TM Distance: >3 FB Neck ROM: Full    Dental  (+) Dental Advisory Given   Pulmonary neg pulmonary ROS,    breath sounds clear to auscultation       Cardiovascular hypertension, Pt. on medications  Rhythm:Regular     Neuro/Psych    GI/Hepatic GERD  Medicated and Controlled,  Endo/Other    Renal/GU      Musculoskeletal  (+) Arthritis ,   Abdominal   Peds  Hematology   Anesthesia Other Findings   Reproductive/Obstetrics                            Anesthesia Physical Anesthesia Plan  ASA: II  Anesthesia Plan: MAC   Post-op Pain Management:    Induction:   PONV Risk Score and Plan: 2 and Treatment may vary due to age or medical condition  Airway Management Planned: Nasal Cannula  Additional Equipment: None  Intra-op Plan:   Post-operative Plan:   Informed Consent: I have reviewed the patients History and Physical, chart, labs and discussed the procedure including the risks, benefits and alternatives for the proposed anesthesia with the patient or authorized representative who has indicated his/her understanding and acceptance.   Dental advisory given  Plan Discussed with: CRNA and Surgeon  Anesthesia Plan Comments:         Anesthesia Quick Evaluation

## 2018-03-05 NOTE — Interval H&P Note (Signed)
History and Physical Interval Note:  03/05/2018 12:17 PM  Brandy Cruz  has presented today for surgery, with the diagnosis of left hip osteoarthritis  The various methods of treatment have been discussed with the patient and family. After consideration of risks, benefits and other options for treatment, the patient has consented to  Procedure(s): TOTAL HIP ARTHROPLASTY ANTERIOR APPROACH (Left) as a surgical intervention .  The patient's history has been reviewed, patient examined, no change in status, stable for surgery.  I have reviewed the patient's chart and labs.  Questions were answered to the patient's satisfaction.     Homero Fellers Adrienna Karis

## 2018-03-06 ENCOUNTER — Encounter (HOSPITAL_COMMUNITY): Payer: Self-pay | Admitting: Orthopedic Surgery

## 2018-03-06 LAB — CBC
HCT: 34.9 % — ABNORMAL LOW (ref 36.0–46.0)
Hemoglobin: 11.8 g/dL — ABNORMAL LOW (ref 12.0–15.0)
MCH: 32.9 pg (ref 26.0–34.0)
MCHC: 33.8 g/dL (ref 30.0–36.0)
MCV: 97.2 fL (ref 80.0–100.0)
Platelets: 272 10*3/uL (ref 150–400)
RBC: 3.59 MIL/uL — ABNORMAL LOW (ref 3.87–5.11)
RDW: 12.3 % (ref 11.5–15.5)
WBC: 13.1 10*3/uL — ABNORMAL HIGH (ref 4.0–10.5)
nRBC: 0 % (ref 0.0–0.2)

## 2018-03-06 LAB — BASIC METABOLIC PANEL
Anion gap: 10 (ref 5–15)
BUN: 9 mg/dL (ref 8–23)
CO2: 27 mmol/L (ref 22–32)
Calcium: 8.7 mg/dL — ABNORMAL LOW (ref 8.9–10.3)
Chloride: 100 mmol/L (ref 98–111)
Creatinine, Ser: 0.62 mg/dL (ref 0.44–1.00)
GFR calc Af Amer: >60 mL/min (ref >60)
GFR calc non Af Amer: >60 mL/min (ref >60)
Glucose, Bld: 125 mg/dL — ABNORMAL HIGH (ref 70–99)
Potassium: 3.4 mmol/L — ABNORMAL LOW (ref 3.5–5.1)
Sodium: 137 mmol/L (ref 135–145)

## 2018-03-06 MED ORDER — TRAMADOL HCL 50 MG PO TABS: 50.0000 mg | ORAL_TABLET | Freq: Four times a day (QID) | ORAL | 0 refills | Status: DC | PRN

## 2018-03-06 MED ORDER — METHOCARBAMOL 500 MG PO TABS: 500.0000 mg | ORAL_TABLET | Freq: Four times a day (QID) | ORAL | 0 refills | Status: DC | PRN

## 2018-03-06 MED ORDER — HYDROCODONE-ACETAMINOPHEN 5-325 MG PO TABS: 1.0000 | ORAL_TABLET | Freq: Four times a day (QID) | ORAL | 0 refills | Status: DC | PRN

## 2018-03-06 MED ORDER — RIVAROXABAN 10 MG PO TABS: 10.0000 mg | ORAL_TABLET | Freq: Every day | ORAL | 0 refills | Status: DC

## 2018-03-06 MED ORDER — POTASSIUM CHLORIDE CRYS ER 20 MEQ PO TBCR
40.0000 meq | EXTENDED_RELEASE_TABLET | Freq: Once | ORAL | Status: AC
  Administered 2018-03-06: 40 meq via ORAL
  Filled 2018-03-06: qty 2

## 2018-03-06 NOTE — Care Management Note (Signed)
Case Management Note  Patient Details  Name: Brandy Cruz MRN: [id number] Date of Birth: [birth year]  Subjective/Objective:    Spoke with patient at bedside. Confirmed plan for HEP. Has RW and 3n1. [phone]                Action/Plan:   Expected Discharge Date:  03/06/18               Expected Discharge Plan:  Home/Self Care  In-House Referral:  NA  Discharge planning Services  CM Consult  Post Acute Care Choice:  NA Choice offered to:  Patient  DME Arranged:  N/A DME Agency:  NA  HH Arranged:  NA HH Agency:  NA  Status of Service:  Completed, signed off  If discussed at Long Length of Stay Meetings, dates discussed:    Additional Comments:  Alexis Goodell, RN 03/06/2018, 9:35 AM

## 2018-03-06 NOTE — Progress Notes (Signed)
   Subjective: 1 Day Post-Op Procedure(s) (LRB): TOTAL HIP ARTHROPLASTY ANTERIOR APPROACH (Left) Patient reports pain as mild.   Patient seen in rounds by Dr. Lequita Halt. Patient is well, and has had no acute complaints or problems other than pain in the left hip. States she is ready to go home. Denies chest pain or SOB. Foley catheter to be removed this AM. No issues overnight.  We will continue therapy today.   Objective: Vital signs in last 24 hours: Temp:  [97.4 F (36.3 C)-98.2 F (36.8 C)] 97.8 F (36.6 C) (01/09 0504) Pulse Rate:  [61-85] 73 (01/09 0504) Resp:  [12-18] 14 (01/09 0504) BP: (105-152)/(57-93) 118/74 (01/09 0504) SpO2:  [95 %-100 %] 95 % (01/09 0504) Weight:  [66.7 kg] 66.7 kg (01/08 1226)  Intake/Output from previous day:  Intake/Output Summary (Last 24 hours) at 03/06/2018 0700 Last data filed at 03/06/2018 0654 Gross per 24 hour  Intake 3664.7 ml  Output 3150 ml  Net 514.7 ml     Intake/Output this shift: Total I/O In: 1497.8 [P.O.:720; I.V.:592.4; IV Piggyback:185.4] Out: 1765 [Urine:1730; Drains:35]  Labs: Recent Labs    03/06/18 0513  HGB 11.8*   Recent Labs    03/06/18 0513  WBC 13.1*  RBC 3.59*  HCT 34.9*  PLT 272   Recent Labs    03/06/18 0513  NA 137  K 3.4*  CL 100  CO2 27  BUN 9  CREATININE 0.62  GLUCOSE 125*  CALCIUM 8.7*   Exam: General - Patient is Alert and Oriented Extremity - Neurologically intact Neurovascular intact Sensation intact distally Dorsiflexion/Plantar flexion intact Dressing - dressing C/D/I Motor Function - intact, moving foot and toes well on exam.   Past Medical History:  Diagnosis Date  . Anemia    hx of  . Anxiety    hx of  . Arthritis   . Gastritis   . Hemorrhoids   . Hyperlipidemia   . Hypertension     Assessment/Plan: 1 Day Post-Op Procedure(s) (LRB): TOTAL HIP ARTHROPLASTY ANTERIOR APPROACH (Left) Principal Problem:   OA (osteoarthritis) of hip  Estimated body mass index is  25.23 kg/m as calculated from the following:   Height as of this encounter: 5\' 4"  (1.626 m).   Weight as of this encounter: 66.7 kg. Advance diet Up with therapy D/C IV fluids  DVT Prophylaxis - Xarelto Weight bearing as tolerated. D/C O2 and pulse ox and try on room air. Hemovac pulled without difficulty, will continue therapy.  Potassium low at 3.4 this AM, ordered one dose of 40 mEq KCl. Plan is to go Home after hospital stay. Plan for discharge after two sessions of physical therapy today with HEP. Follow-up in the office in 2 weeks with Dr. Lequita Halt.  Arther Abbott, PA-C Orthopedic Surgery 03/06/2018, 7:00 AM

## 2018-03-06 NOTE — Plan of Care (Signed)
Pt is stable. Plan of care reviewed. Pain management in progress.  

## 2018-03-06 NOTE — Progress Notes (Signed)
Physical Therapy Treatment Patient Details Name: Brandy Cruz MRN: [id number] DOB: [birth year] Today's Date: 03/06/2018    History of Present Illness 68 yo female s/p L DA-THA on 03/05/18. PMH includes OA with L TKR in 2016, anemia, anxiety, HLD, HTN.     PT Comments    POD # 1 am session Assisted OOB to amb a greater distance, practiced one large step then Performed some TE's following HEP handout.  Instructed on proper tech, freq as well as use of ICE.  Addressed all mobility questions, discussed appropriate activity, educated on use of ICE.  Pt ready for D/C to home.    Follow Up Recommendations  Follow surgeon's recommendation for DC plan and follow-up therapies;Supervision for mobility/OOB     Equipment Recommendations  None recommended by PT    Recommendations for Other Services       Precautions / Restrictions Precautions Precautions: Fall Restrictions Weight Bearing Restrictions: No Other Position/Activity Restrictions: WBAT     Mobility  Bed Mobility Overal bed mobility: Needs Assistance Bed Mobility: Supine to Sit     Supine to sit: Supervision     General bed mobility comments: demonstarted and instructed how to use a belt to assist L LE  Transfers Overall transfer level: Needs assistance Equipment used: Rolling walker (2 wheeled) Transfers: Sit to/from Stand Sit to Stand: Supervision         General transfer comment: increased time and one VC for safety with turns  Ambulation/Gait Ambulation/Gait assistance: Supervision Gait Distance (Feet): 95 Feet Assistive device: Rolling walker (2 wheeled) Gait Pattern/deviations: Step-to pattern;Decreased stance time - left;Decreased weight shift to left;Antalgic Gait velocity: decreased    General Gait Details: one VC safety with turns    Stairs Stairs: Yes Stairs assistance: Supervision;Min guard Stair Management: No rails;Step to pattern;Backwards Number of Stairs: 1 General stair comments: with  spouse "hands on" assist with 25% VC's on proper tech up backward due to extra high step    Wheelchair Mobility    Modified Rankin (Stroke Patients Only)       Balance                                            Cognition Arousal/Alertness: Awake/alert Behavior During Therapy: WFL for tasks assessed/performed Overall Cognitive Status: Within Functional Limits for tasks assessed                                        Exercises   Total Hip Replacement TE's 10 reps ankle pumps 10 reps knee presses 10 reps heel slides 10 reps SAQ's 10 reps ABD Followed by ICE     General Comments        Pertinent Vitals/Pain Pain Assessment: 0-10 Pain Score: 4  Pain Location: L hip  Pain Descriptors / Indicators: Tender;Tightness;Operative site guarding Pain Intervention(s): Monitored during session;Repositioned;Ice applied;Premedicated before session    Home Living                      Prior Function            PT Goals (current goals can now be found in the care plan section) Progress towards PT goals: Progressing toward goals    Frequency    7X/week      PT Plan  Current plan remains appropriate    Co-evaluation              AM-PAC PT "6 Clicks" Mobility   Outcome Measure  Help needed turning from your back to your side while in a flat bed without using bedrails?: A Little Help needed moving from lying on your back to sitting on the side of a flat bed without using bedrails?: A Little Help needed moving to and from a bed to a chair (including a wheelchair)?: A Little Help needed standing up from a chair using your arms (e.g., wheelchair or bedside chair)?: A Little Help needed to walk in hospital room?: A Little Help needed climbing 3-5 steps with a railing? : A Little 6 Click Score: 18    End of Session Equipment Utilized During Treatment: Gait belt Activity Tolerance: Patient tolerated treatment well Patient  left: in chair;with chair alarm set;with call bell/phone within reach;with family/visitor present;with SCD's reapplied Nurse Communication: (pt ready for D/C to home) PT Visit Diagnosis: Other abnormalities of gait and mobility (R26.89);Difficulty in walking, not elsewhere classified (R26.2)     Time: 9485-4627 PT Time Calculation (min) (ACUTE ONLY): 27 min  Charges:  $Gait Training: 8-22 mins $Therapeutic Exercise: 8-22 mins                     Felecia Shelling  PTA Acute  Rehabilitation Services Pager      [phone] Office      [phone]

## 2018-03-07 NOTE — Anesthesia Postprocedure Evaluation (Signed)
Anesthesia Post Note  Patient: Brandy Cruz  Procedure(s) Performed: TOTAL HIP ARTHROPLASTY ANTERIOR APPROACH (Left Hip)     Patient location during evaluation: PACU Anesthesia Type: MAC and Spinal Level of consciousness: awake and alert Pain management: pain level controlled Vital Signs Assessment: post-procedure vital signs reviewed and stable Respiratory status: spontaneous breathing, nonlabored ventilation, respiratory function stable and patient connected to nasal cannula oxygen Cardiovascular status: stable and blood pressure returned to baseline Postop Assessment: no apparent nausea or vomiting and spinal receding Anesthetic complications: no    Last Vitals:  Vitals:   03/06/18 0504 03/06/18 1042  BP: 118/74 117/61  Pulse: 73 72  Resp: 14 14  Temp: 36.6 C 36.6 C  SpO2: 95% 99%    Last Pain:  Vitals:   03/06/18 1042  TempSrc: Oral  PainSc:                  Edwena Mayorga

## 2018-03-10 NOTE — Discharge Summary (Signed)
Physician Discharge Summary   Patient ID: Brandy Cruz MRN: [id number] DOB/AGE: 70/08/1950 70 y.o.  Admit date: 03/05/2018 Discharge date: 03/06/2018  Primary Diagnosis: Osteoarthritis, left hip   Admission Diagnoses:  Past Medical History:  Diagnosis Date  . Anemia    hx of  . Anxiety    hx of  . Arthritis   . Gastritis   . Hemorrhoids   . Hyperlipidemia   . Hypertension    Discharge Diagnoses:   Principal Problem:   OA (osteoarthritis) of hip  Estimated body mass index is 25.23 kg/m as calculated from the following:   Height as of this encounter: 5\' 4"  (1.626 m).   Weight as of this encounter: 66.7 kg.  Procedure:  Procedure(s) (LRB): TOTAL HIP ARTHROPLASTY ANTERIOR APPROACH (Left)   Consults: None  HPI: Brandy Cruz is a 68 y.o. female who has advanced end-stage arthritis of their Left  hip with progressively worsening pain and dysfunction.The patient has failed nonoperative management and presents for  total hip arthroplasty.   Laboratory Data: Admission on 03/05/2018, Discharged on 03/06/2018  Component Date Value Ref Range Status  . WBC 03/06/2018 13.1* 4.0 - 10.5 K/uL Final  . RBC 03/06/2018 3.59* 3.87 - 5.11 MIL/uL Final  . Hemoglobin 03/06/2018 11.8* 12.0 - 15.0 g/dL Final  . HCT 91/47/829501/10/2018 34.9* 36.0 - 46.0 % Final  . MCV 03/06/2018 97.2  80.0 - 100.0 fL Final  . MCH 03/06/2018 32.9  26.0 - 34.0 pg Final  . MCHC 03/06/2018 33.8  30.0 - 36.0 g/dL Final  . RDW 62/13/086501/10/2018 12.3  11.5 - 15.5 % Final  . Platelets 03/06/2018 272  150 - 400 K/uL Final  . nRBC 03/06/2018 0.0  0.0 - 0.2 % Final   Performed at [facility], [address] [place], [place] [address]  . Sodium 03/06/2018 137  135 - 145 mmol/L Final  . Potassium 03/06/2018 3.4* 3.5 - 5.1 mmol/L Final  . Chloride 03/06/2018 100  98 - 111 mmol/L Final  . CO2 03/06/2018 27  22 - 32 mmol/L Final  . Glucose, Bld 03/06/2018 125* 70 - 99 mg/dL Final  . BUN 62/95/284101/10/2018 9  8 - 23 mg/dL  Final  . Creatinine, Ser 03/06/2018 0.62  0.44 - 1.00 mg/dL Final  . Calcium 32/44/010201/10/2018 8.7* 8.9 - 10.3 mg/dL Final  . GFR calc non Af Amer 03/06/2018 >60  >60 mL/min Final  . GFR calc Af Amer 03/06/2018 >60  >60 mL/min Final  . Anion gap 03/06/2018 10  5 - 15 Final   Performed at [facility], [address] [place], [place] [address]  Hospital Outpatient Visit on 02/24/2018  Component Date Value Ref Range Status  . aPTT 02/24/2018 31  24 - 36 seconds Final   Performed at [facility], [address] [place], [place] [address]  . WBC 02/24/2018 7.3  4.0 - 10.5 K/uL Final  . RBC 02/24/2018 4.13  3.87 - 5.11 MIL/uL Final  . Hemoglobin 02/24/2018 13.4  12.0 - 15.0 g/dL Final  . HCT 47/42/595612/30/2019 39.7  36.0 - 46.0 % Final  . MCV 02/24/2018 96.1  80.0 - 100.0 fL Final  . MCH 02/24/2018 32.4  26.0 - 34.0 pg Final  . MCHC 02/24/2018 33.8  30.0 - 36.0 g/dL Final  . RDW 38/75/643312/30/2019 12.7  11.5 - 15.5 % Final  . Platelets 02/24/2018 330  150 - 400 K/uL Final  . nRBC 02/24/2018 0.0  0.0 - 0.2 %  Final   Performed at [facility], [address] [place], [place] [address]  . Sodium 02/24/2018 138  135 - 145 mmol/L Final  . Potassium 02/24/2018 3.3* 3.5 - 5.1 mmol/L Final  . Chloride 02/24/2018 99  98 - 111 mmol/L Final  . CO2 02/24/2018 29  22 - 32 mmol/L Final  . Glucose, Bld 02/24/2018 90  70 - 99 mg/dL Final  . BUN 91/47/8295 11  8 - 23 mg/dL Final  . Creatinine, Ser 02/24/2018 0.67  0.44 - 1.00 mg/dL Final  . Calcium 62/13/0865 9.2  8.9 - 10.3 mg/dL Final  . Total Protein 02/24/2018 7.0  6.5 - 8.1 g/dL Final  . Albumin 78/46/9629 4.0  3.5 - 5.0 g/dL Final  . AST 52/84/1324 19  15 - 41 U/L Final  . ALT 02/24/2018 20  0 - 44 U/L Final  . Alkaline Phosphatase 02/24/2018 68  38 - 126 U/L Final  . Total Bilirubin 02/24/2018 0.8  0.3 - 1.2 mg/dL Final  . GFR calc non Af Amer 02/24/2018 >60  >60 mL/min Final  . GFR calc Af Amer 02/24/2018  >60  >60 mL/min Final  . Anion gap 02/24/2018 10  5 - 15 Final   Performed at [facility], [address] [place], [place] [address]  . Prothrombin Time 02/24/2018 12.7  11.4 - 15.2 seconds Final  . INR 02/24/2018 0.96   Final   Performed at [facility], [address] [place], [place] [address]  . ABO/RH(D) 02/24/2018 O POS   Final  . Antibody Screen 02/24/2018 NEG   Final  . Sample Expiration 02/24/2018 03/08/2018   Final  . Extend sample reason 02/24/2018    Final                   Value:NO TRANSFUSIONS OR PREGNANCY IN THE PAST 3 MONTHS Performed at [facility], [address], [place], [place] [address]   . MRSA, PCR 02/24/2018 NEGATIVE  NEGATIVE Final  . Staphylococcus aureus 02/24/2018 NEGATIVE  NEGATIVE Final   Comment: (NOTE) The Xpert SA Assay (FDA approved for NASAL specimens in patients 52 years of age and older), is one component of a comprehensive surveillance program. It is not intended to diagnose infection nor to guide or monitor treatment. Performed at [facility], [address], [place], [place] [address]      X-Rays:Dg Pelvis Portable  Result Date: 03/05/2018 CLINICAL DATA:  Status post hip replacement EXAM: PORTABLE PELVIS 1-2 VIEWS COMPARISON:  03/05/2018 FINDINGS: Pubic symphysis and rami are intact. Status post left hip replacement with normal alignment and intact hardware. Surgical drain in the soft tissues. Soft tissue gas consistent with recent surgery. IMPRESSION: Status post left hip replacement with expected surgical changes Electronically Signed   By: Jasmine Pang M.D.   On: 03/05/2018 15:52   Dg C-arm 1-60 Min-no Report  Result Date: 03/05/2018 Fluoroscopy was utilized by the requesting physician.  No radiographic interpretation.   Dg Hip Operative Unilat With Pelvis Left  Result Date: 03/05/2018 CLINICAL DATA:  Left hip replacement EXAM: OPERATIVE LEFT HIP WITH PELVIS  COMPARISON:  None. FLUOROSCOPY TIME:  Radiation Exposure Index (as provided by the fluoroscopic device): 1.21 mGy If the device does not provide the exposure index: Fluoroscopy Time:  13 seconds Number of Acquired Images:  5 FINDINGS: Multiple spot films were obtained which show no replacement of the proximal left femur with the left femoral prosthesis. It  is well seated without acute bony or soft tissue abnormality noted. IMPRESSION: Left hip replacement Electronically Signed   By: Alcide CleverMark  Lukens M.D.   On: 03/05/2018 15:14    EKG: Orders placed or performed during the hospital encounter of 02/24/18  . EKG 12 lead  . EKG 12 lead     Hospital Course: Brandy Cruz is a 68 y.o. who was admitted to [facility]. They were brought to the operating room on 03/05/2018 and underwent Procedure(s): TOTAL HIP ARTHROPLASTY ANTERIOR APPROACH.  Patient tolerated the procedure well and was later transferred to the recovery room and then to the orthopaedic floor for postoperative care. They were given PO and IV analgesics for pain control following their surgery. They were given 24 hours of postoperative antibiotics of  Anti-infectives (From admission, onward)   Start     Dose/Rate Route Frequency Ordered Stop   03/05/18 2000  ceFAZolin (ANCEF) IVPB 2g/100 mL premix     2 g 200 mL/hr over 30 Minutes Intravenous Every 6 hours 03/05/18 1644 03/06/18 0304   03/05/18 1215  ceFAZolin (ANCEF) IVPB 2g/100 mL premix     2 g 200 mL/hr over 30 Minutes Intravenous On call to O.R. 03/05/18 1210 03/05/18 1346     and started on DVT prophylaxis in the form of Xarelto.   PT and OT were ordered for total joint protocol. Discharge planning consulted to help with postop disposition and equipment needs.  Patient had a good night on the evening of surgery. They started to get up OOB with therapy on POD #1. Pt was seen during rounds and was ready to go home pending progress with therapy. Hemovac drain was pulled without  difficulty. Potassium was low at 3.4, one dose of 40 mEq KCl was ordered. She worked with therapy on POD #1 and was meeting her goals. Pt was discharged to home later that day in stable condition.  Diet: Regular diet Activity: WBAT Follow-up: in 2 weeks with Dr. Lequita HaltAluisio Disposition: Home with HEP Discharged Condition: stable   Discharge Instructions    Call MD / Call 911   Complete by:  As directed    If you experience chest pain or shortness of breath, CALL 911 and be transported to the hospital emergency room.  If you develope a fever above 101 F, pus (white drainage) or increased drainage or redness at the wound, or calf pain, call your surgeon's office.   Change dressing   Complete by:  As directed    You may change your dressing on Friday, then change the dressing daily with sterile 4 x 4 inch gauze dressing and paper tape.   Constipation Prevention   Complete by:  As directed    Drink plenty of fluids.  Prune juice may be helpful.  You may use a stool softener, such as Colace (over the counter) 100 mg twice a day.  Use MiraLax (over the counter) for constipation as needed.   Diet - low sodium heart healthy   Complete by:  As directed    Discharge instructions   Complete by:  As directed    Dr. Ollen GrossFrank Aluisio Total Joint Specialist [facility] [address] [place], [place] [phone]  ANTERIOR APPROACH TOTAL HIP REPLACEMENT POSTOPERATIVE DIRECTIONS   Hip Rehabilitation, Guidelines Following Surgery  The results of a hip operation are greatly improved after range of motion and muscle strengthening exercises. Follow all safety measures which are given to protect your hip. If any  of these exercises cause increased pain or swelling in your joint, decrease the amount until you are comfortable again. Then slowly increase the exercises. Call your caregiver if you have problems or questions.   HOME CARE INSTRUCTIONS  Remove items at home which could result  in a fall. This includes throw rugs or furniture in walking pathways.  ICE to the affected hip every three hours for 30 minutes at a time and then as needed for pain and swelling.  Continue to use ice on the hip for pain and swelling from surgery. You may notice swelling that will progress down to the foot and ankle.  This is normal after surgery.  Elevate the leg when you are not up walking on it.   Continue to use the breathing machine which will help keep your temperature down.  It is common for your temperature to cycle up and down following surgery, especially at night when you are not up moving around and exerting yourself.  The breathing machine keeps your lungs expanded and your temperature down.  DIET You may resume your previous home diet once your are discharged from the hospital.  DRESSING / WOUND CARE / SHOWERING You may shower 3 days after surgery, but keep the wounds dry during showering.  You may use an occlusive plastic wrap (Press'n Seal for example), NO SOAKING/SUBMERGING IN THE BATHTUB.  If the bandage gets wet, change with a clean dry gauze.  If the incision gets wet, pat the wound dry with a clean towel. You may start showering once you are discharged home but do not submerge the incision under water. Just pat the incision dry and apply a dry gauze dressing on daily. Change the surgical dressing daily and reapply a dry dressing each time.  ACTIVITY Walk with your walker as instructed. Use walker as long as suggested by your caregivers. Avoid periods of inactivity such as sitting longer than an hour when not asleep. This helps prevent blood clots.  You may resume a sexual relationship in one month or when given the OK by your doctor.  You may return to work once you are cleared by your doctor.  Do not drive a car for 6 weeks or until released by you surgeon.  Do not drive while taking narcotics.  WEIGHT BEARING Weight bearing as tolerated with assist device (walker, cane,  etc) as directed, use it as long as suggested by your surgeon or therapist, typically at least 4-6 weeks.  POSTOPERATIVE CONSTIPATION PROTOCOL Constipation - defined medically as fewer than three stools per week and severe constipation as less than one stool per week.  One of the most common issues patients have following surgery is constipation.  Even if you have a regular bowel pattern at home, your normal regimen is likely to be disrupted due to multiple reasons following surgery.  Combination of anesthesia, postoperative narcotics, change in appetite and fluid intake all can affect your bowels.  In order to avoid complications following surgery, here are some recommendations in order to help you during your recovery period.  Colace (docusate) - Pick up an over-the-counter form of Colace or another stool softener and take twice a day as long as you are requiring postoperative pain medications.  Take with a full glass of water daily.  If you experience loose stools or diarrhea, hold the colace until you stool forms back up.  If your symptoms do not get better within 1 week or if they get worse, check with your doctor.  Dulcolax (bisacodyl) - Pick up over-the-counter and take as directed by the product packaging as needed to assist with the movement of your bowels.  Take with a full glass of water.  Use this product as needed if not relieved by Colace only.   MiraLax (polyethylene glycol) - Pick up over-the-counter to have on hand.  MiraLax is a solution that will increase the amount of water in your bowels to assist with bowel movements.  Take as directed and can mix with a glass of water, juice, soda, coffee, or tea.  Take if you go more than two days without a movement. Do not use MiraLax more than once per day. Call your doctor if you are still constipated or irregular after using this medication for 7 days in a row.  If you continue to have problems with postoperative constipation, please contact  the office for further assistance and recommendations.  If you experience "the worst abdominal pain ever" or develop nausea or vomiting, please contact the office immediatly for further recommendations for treatment.  ITCHING  If you experience itching with your medications, try taking only a single pain pill, or even half a pain pill at a time.  You can also use Benadryl over the counter for itching or also to help with sleep.   TED HOSE STOCKINGS Wear the elastic stockings on both legs for three weeks following surgery during the day but you may remove then at night for sleeping.  MEDICATIONS See your medication summary on the "After Visit Summary" that the nursing staff will review with you prior to discharge.  You may have some home medications which will be placed on hold until you complete the course of blood thinner medication.  It is important for you to complete the blood thinner medication as prescribed by your surgeon.  Continue your approved medications as instructed at time of discharge.  PRECAUTIONS If you experience chest pain or shortness of breath - call 911 immediately for transfer to the hospital emergency department.  If you develop a fever greater that 101 F, purulent drainage from wound, increased redness or drainage from wound, foul odor from the wound/dressing, or calf pain - CONTACT YOUR SURGEON.                                                   FOLLOW-UP APPOINTMENTS Make sure you keep all of your appointments after your operation with your surgeon and caregivers. You should call the office at the above phone number and make an appointment for approximately two weeks after the date of your surgery or on the date instructed by your surgeon outlined in the "After Visit Summary".  RANGE OF MOTION AND STRENGTHENING EXERCISES  These exercises are designed to help you keep full movement of your hip joint. Follow your caregiver's or physical therapist's instructions. Perform all  exercises about fifteen times, three times per day or as directed. Exercise both hips, even if you have had only one joint replacement. These exercises can be done on a training (exercise) mat, on the floor, on a table or on a bed. Use whatever works the best and is most comfortable for you. Use music or television while you are exercising so that the exercises are a pleasant break in your day. This will make your life better with the exercises acting as a  break in routine you can look forward to.  Lying on your back, slowly slide your foot toward your buttocks, raising your knee up off the floor. Then slowly slide your foot back down until your leg is straight again.  Lying on your back spread your legs as far apart as you can without causing discomfort.  Lying on your side, raise your upper leg and foot straight up from the floor as far as is comfortable. Slowly lower the leg and repeat.  Lying on your back, tighten up the muscle in the front of your thigh (quadriceps muscles). You can do this by keeping your leg straight and trying to raise your heel off the floor. This helps strengthen the largest muscle supporting your knee.  Lying on your back, tighten up the muscles of your buttocks both with the legs straight and with the knee bent at a comfortable angle while keeping your heel on the floor.   IF YOU ARE TRANSFERRED TO A SKILLED REHAB FACILITY If the patient is transferred to a skilled rehab facility following release from the hospital, a list of the current medications will be sent to the facility for the patient to continue.  When discharged from the skilled rehab facility, please have the facility set up the patient's Home Health Physical Therapy prior to being released. Also, the skilled facility will be responsible for providing the patient with their medications at time of release from the facility to include their pain medication, the muscle relaxants, and their blood thinner medication. If  the patient is still at the rehab facility at time of the two week follow up appointment, the skilled rehab facility will also need to assist the patient in arranging follow up appointment in our office and any transportation needs.  MAKE SURE YOU:  Understand these instructions.  Get help right away if you are not doing well or get worse.    Pick up stool softner and laxative for home use following surgery while on pain medications. Do not submerge incision under water. Please use good hand washing techniques while changing dressing each day. May shower starting three days after surgery. Please use a clean towel to pat the incision dry following showers. Continue to use ice for pain and swelling after surgery. Do not use any lotions or creams on the incision until instructed by your surgeon.   Do not sit on low chairs, stoools or toilet seats, as it may be difficult to get up from low surfaces   Complete by:  As directed    Driving restrictions   Complete by:  As directed    No driving for two weeks   TED hose   Complete by:  As directed    Use stockings (TED hose) for three weeks on both leg(s).  You may remove them at night for sleeping.   Weight bearing as tolerated   Complete by:  As directed      Allergies as of 03/06/2018      Reactions   Sulfa Antibiotics Hives, Itching   + fever   Levaquin [levofloxacin In D5w]    Dizziness and headache   Penicillins Rash   Childhood allergy.       Medication List    TAKE these medications   allopurinol 100 MG tablet Commonly known as:  ZYLOPRIM Take 100 mg by mouth daily.   ALPRAZolam 0.25 MG tablet Commonly known as:  XANAX Take 0.25 mg by mouth 2 (two) times daily as needed for  anxiety.   colchicine 0.6 MG tablet Take 0.6 mg by mouth daily as needed (gout).   esomeprazole 40 MG capsule Commonly known as:  NEXIUM Take 40 mg by mouth 2 (two) times daily.   HYDROcodone-acetaminophen 5-325 MG tablet Commonly known as:   NORCO/VICODIN Take 1-2 tablets by mouth every 6 (six) hours as needed for moderate pain or severe pain (pain score 4-6).   methocarbamol 500 MG tablet Commonly known as:  ROBAXIN Take 1 tablet (500 mg total) by mouth every 6 (six) hours as needed for muscle spasms.   ranitidine 300 MG tablet Commonly known as:  ZANTAC Take 300 mg by mouth at bedtime.   rivaroxaban 10 MG Tabs tablet Commonly known as:  XARELTO Take 1 tablet (10 mg total) by mouth daily with breakfast for 20 days. Then take one 81 mg aspirin once a day for three weeks. Then discontinue aspirin.   sucralfate 1 g tablet Commonly known as:  CARAFATE Take 1 g by mouth 2 (two) times daily.   traMADol 50 MG tablet Commonly known as:  ULTRAM Take 1-2 tablets (50-100 mg total) by mouth every 6 (six) hours as needed for moderate pain.   valsartan-hydrochlorothiazide 320-12.5 MG tablet Commonly known as:  DIOVAN-HCT Take 1 tablet by mouth every morning.            Discharge Care Instructions  (From admission, onward)         Start     Ordered   03/06/18 0000  Weight bearing as tolerated     03/06/18 0706   03/06/18 0000  Change dressing    Comments:  You may change your dressing on Friday, then change the dressing daily with sterile 4 x 4 inch gauze dressing and paper tape.   03/06/18 1610         Follow-up Information    Ollen Gross, MD. Schedule an appointment as soon as possible for a visit on 03/20/2018.   Specialty:  Orthopedic Surgery Contact information: [address] [place] [address] [phone]           Signed: Arther Abbott, PA-C Orthopedic Surgery 03/10/2018, 9:16 AM

## 2018-06-18 ENCOUNTER — Telehealth: Payer: Self-pay | Admitting: Cardiology

## 2018-06-18 NOTE — Telephone Encounter (Signed)
Virtual Visit Pre-Appointment Phone Call  "(Name), I am calling you today to discuss your upcoming appointment. We are currently trying to limit exposure to the virus that causes COVID-19 by seeing patients at home rather than in the office."  1. "What is the BEST phone number to call the day of the visit?" - include this in appointment notes  2. Do you have or have access to (through a family member/friend) a smartphone with video capability that we can use for your visit?" a. If yes - list this number in appt notes as cell (if different from BEST phone #) and list the appointment type as a VIDEO visit in appointment notes b. If no - list the appointment type as a PHONE visit in appointment notes  3. Confirm consent - "In the setting of the current Covid19 crisis, you are scheduled for a (phone or video) visit with your provider on (date) at (time).  Just as we do with many in-office visits, in order for you to participate in this visit, we must obtain consent.  If you'd like, I can send this to your mychart (if signed up) or email for you to review.  Otherwise, I can obtain your verbal consent now.  All virtual visits are billed to your insurance company just like a normal visit would be.  By agreeing to a virtual visit, we'd like you to understand that the technology does not allow for your provider to perform an examination, and thus may limit your provider's ability to fully assess your condition. If your provider identifies any concerns that need to be evaluated in person, we will make arrangements to do so.  Finally, though the technology is pretty good, we cannot assure that it will always work on either your or our end, and in the setting of a video visit, we may have to convert it to a phone-only visit.  In either situation, we cannot ensure that we have a secure connection.  Are you willing to proceed?" STAFF: Did the patient verbally acknowledge consent to telehealth visit? Document  YES/NO here: YES  4. Advise patient to be prepared - "Two hours prior to your appointment, go ahead and check your blood pressure, pulse, oxygen saturation, and your weight (if you have the equipment to check those) and write them all down. When your visit starts, your provider will ask you for this information. If you have an Apple Watch or Kardia device, please plan to have heart rate information ready on the day of your appointment. Please have a pen and paper handy nearby the day of the visit as well."  5. Give patient instructions for MyChart download to smartphone OR Doximity/Doxy.me as below if video visit (depending on what platform provider is using)  6. Inform patient they will receive a phone call 15 minutes prior to their appointment time (may be from unknown caller ID) so they should be prepared to answer    TELEPHONE CALL NOTE  Brandy SaverCathy C Gelber has been deemed a candidate for a follow-up tele-health visit to limit community exposure during the Covid-19 pandemic. I spoke with the patient via phone to ensure availability of phone/video source, confirm preferred email & phone number, and discuss instructions and expectations.  I reminded Brandy Cruz to be prepared with any vital sign and/or heart rhythm information that could potentially be obtained via home monitoring, at the time of her visit. I reminded Brandy Cruz to expect a phone call prior to  her visit.  Brandy Cruz 06/18/2018 11:51 AM   INSTRUCTIONS FOR DOWNLOADING THE MYCHART APP TO SMARTPHONE  - The patient must first make sure to have activated MyChart and know their login information - If Apple, go to [employer] and type in MyChart in the search bar and download the app. If Android, ask patient to go to [employer] and type in [place] in the search bar and download the app. The app is free but as with any other app downloads, their phone may require them to verify saved payment information or Apple/Android  password.  - The patient will need to then log into the app with their MyChart username and password, and select [place] as their healthcare provider to link the account. When it is time for your visit, go to the MyChart app, find appointments, and click Begin Video Visit. Be sure to Select Allow for your device to access the Microphone and Camera for your visit. You will then be connected, and your provider will be with you shortly.  **If they have any issues connecting, or need assistance please contact MyChart service desk [phone])**  **If using a computer, in order to ensure the best quality for their visit they will need to use either of the following Internet Browsers: [employer] or Google Chrome**  IF USING DOXIMITY or DOXY.ME - The patient will receive a link just prior to their visit by text.     FULL LENGTH CONSENT FOR TELE-HEALTH VISIT   I hereby voluntarily request, consent and authorize [facility] and its employed or contracted physicians, physician assistants, nurse practitioners or other licensed health care professionals (the Practitioner), to provide me with telemedicine health care services (the Services") as deemed necessary by the treating Practitioner. I acknowledge and consent to receive the Services by the Practitioner via telemedicine. I understand that the telemedicine visit will involve communicating with the Practitioner through live audiovisual communication technology and the disclosure of certain medical information by electronic transmission. I acknowledge that I have been given the opportunity to request an in-person assessment or other available alternative prior to the telemedicine visit and am voluntarily participating in the telemedicine visit.  I understand that I have the right to withhold or withdraw my consent to the use of telemedicine in the course of my care at any time, without affecting my right to future care or treatment,  and that the Practitioner or I may terminate the telemedicine visit at any time. I understand that I have the right to inspect all information obtained and/or recorded in the course of the telemedicine visit and may receive copies of available information for a reasonable fee.  I understand that some of the potential risks of receiving the Services via telemedicine include:   Delay or interruption in medical evaluation due to technological equipment failure or disruption;  Information transmitted may not be sufficient (e.g. poor resolution of images) to allow for appropriate medical decision making by the Practitioner; and/or   In rare instances, security protocols could fail, causing a breach of personal health information.  Furthermore, I acknowledge that it is my responsibility to provide information about my medical history, conditions and care that is complete and accurate to the best of my ability. I acknowledge that Practitioner's advice, recommendations, and/or decision may be based on factors not within their control, such as incomplete or inaccurate data provided by me or distortions of diagnostic images or specimens that may result from electronic transmissions. I  understand that the practice of medicine is not an exact science and that Practitioner makes no warranties or guarantees regarding treatment outcomes. I acknowledge that I will receive a copy of this consent concurrently upon execution via email to the email address I last provided but may also request a printed copy by calling the office of [place].    I understand that my insurance will be billed for this visit.   I have read or had this consent read to me.  I understand the contents of this consent, which adequately explains the benefits and risks of the Services being provided via telemedicine.   I have been provided ample opportunity to ask questions regarding this consent and the Services and have had my questions  answered to my satisfaction.  I give my informed consent for the services to be provided through the use of telemedicine in my medical care  By participating in this telemedicine visit I agree to the above.

## 2018-07-03 ENCOUNTER — Encounter: Payer: Self-pay | Admitting: Cardiology

## 2018-07-03 ENCOUNTER — Other Ambulatory Visit: Payer: Self-pay

## 2018-07-03 ENCOUNTER — Telehealth (INDEPENDENT_AMBULATORY_CARE_PROVIDER_SITE_OTHER): Payer: Medicare Other | Admitting: Cardiology

## 2018-07-03 VITALS — BP 131/82 | HR 71 | Wt 148.5 lb

## 2018-07-03 DIAGNOSIS — R0789 Other chest pain: Secondary | ICD-10-CM

## 2018-07-03 DIAGNOSIS — R9431 Abnormal electrocardiogram [ECG] [EKG]: Secondary | ICD-10-CM

## 2018-07-03 DIAGNOSIS — E785 Hyperlipidemia, unspecified: Secondary | ICD-10-CM

## 2018-07-03 NOTE — Progress Notes (Signed)
Virtual Visit via Video Note   This visit type was conducted due to national recommendations for restrictions regarding the COVID-19 Pandemic (e.g. social distancing) in an effort to limit this patient's exposure and mitigate transmission in our community.  Due to her co-morbid illnesses, this patient is at least at moderate risk for complications without adequate follow up.  This format is felt to be most appropriate for this patient at this time.  All issues noted in this document were discussed and addressed.  A limited physical exam was performed with this format.  Please refer to the patient's chart for her consent to telehealth for [facility].  Evaluation Performed:  Follow-up visit  This visit type was conducted due to national recommendations for restrictions regarding the COVID-19 Pandemic (e.g. social distancing).  This format is felt to be most appropriate for this patient at this time.  All issues noted in this document were discussed and addressed.  No physical exam was performed (except for noted visual exam findings with Video Visits).  Please refer to the patient's chart (MyChart message for video visits and phone note for telephone visits) for the patient's consent to telehealth for [facility].  Date:  07/03/2018  ID: Brandy Cruz, DOB 1950-02-28, MRN [id number]   Patient Location: [address] [facility] [address]   Provider location:   [facility] [place] [facility]  PCP:  Marylen Ponto, MD  Cardiologist:  Gypsy Balsam, MD     Chief Complaint: Doing well  History of Present Illness:    Brandy Cruz is a 68 y.o. female  who presents via audio/video conferencing for a telehealth visit today.  With atypical chest pain, hypertension, hyperlipidemia, anxiety I saw her a few months ago when she was getting ready to have hip surgery she also had atypical chest pain.  Stress test was done as well as echocardiogram and those were normal she ended up going to hip  surgery with no difficulties and she recovered quite nicely.  She is very happy with the surgery that she had she walks on a regular basis and she is doing well overall.  Denies having any cardiac complaints there is no chest pain tightness squeezing pressure burning chest.   The patient does not have symptoms concerning for COVID-19 infection (fever, chills, cough, or new SHORTNESS OF BREATH).    Prior CV studies:   The following studies were reviewed today:       Past Medical History:  Diagnosis Date  . Anemia    hx of  . Anxiety    hx of  . Arthritis   . Gastritis   . Hemorrhoids   . Hyperlipidemia   . Hypertension     Past Surgical History:  Procedure Laterality Date  . APPENDECTOMY     2009  . CHOLECYSTECTOMY     2013  . 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 allopurinol (ZYLOPRIM) 100 MG tablet Take 100 mg by mouth daily.  Marland Kitchen ALPRAZolam (XANAX) 0.25 MG tablet Take 0.25 mg by mouth 2 (two) times daily as needed for anxiety.  . colchicine 0.6 MG tablet  Take 0.6 mg by mouth daily as needed (gout).   . sucralfate (CARAFATE) 1 g tablet Take 1 g by mouth 2 (two) times daily.   . valsartan-hydrochlorothiazide (DIOVAN-HCT) 320-12.5 MG per tablet Take 1 tablet by mouth every morning.      Family History: The patient's family history includes Arthritis in her mother; Prostate cancer in her father.   ROS:   Please see the history of present illness.     All other systems reviewed and are negative.   Labs/Other Tests and Data Reviewed:     Recent Labs: 02/24/2018: ALT 20 03/06/2018: BUN 9; Creatinine, Ser 0.62;  Hemoglobin 11.8; Platelets 272; Potassium 3.4; Sodium 137  Recent Lipid Panel    Component Value Date/Time   CHOL 214 (H) 02/13/2017 1710   TRIG 144 02/13/2017 1710   HDL 56 02/13/2017 1710   CHOLHDL 3.8 02/13/2017 1710   LDLCALC 129 (H) 02/13/2017 1710      Exam:    Vital Signs:  BP 131/82   Pulse 71   Wt 148 lb 8 oz (67.4 kg)   BMI 25.49 kg/m     Wt Readings from Last 3 Encounters:  07/03/18 148 lb 8 oz (67.4 kg)  03/05/18 147 lb (66.7 kg)  02/24/18 147 lb (66.7 kg)     Well nourished, well developed in no acute distress. Alert awake oriented x3 with talking over the video link she seems to be very happy and satisfied.  Diagnosis for this visit:   1. Atypical chest pain   2. Abnormal EKG   3. Dyslipidemia      ASSESSMENT & PLAN:    1.  Atypical chest pain.  Denies having any all tests were negative. 2.  Dyslipidemia.  She is scheduled to have visits with her primary care physician we will wait for the results of the visit and her fasting lipid profile. 3.  Abnormal EKG.  Testing so far negative.  Overall she is doing well with talking at length about risk factors modifications.  She or was very worried about the coronavirus situation we discussed this as well she seems to be doing better now  COVID-19 Education: The signs and symptoms of COVID-19 were discussed with the patient and how to seek care for testing (follow up with PCP or arrange E-visit).  The importance of social distancing was discussed today.  Patient Risk:   After full review of this patients clinical status, I feel that they are at least moderate risk at this time.  Time:   Today, I have spent 21 minutes with the patient with telehealth technology discussing pt health issues.  I spent 5 minutes reviewing her chart before the visit.  Visit was finished at 1:23 PM.    Medication Adjustments/Labs and Tests Ordered: Current medicines are reviewed at length with the patient today.  Concerns  regarding medicines are outlined above.  No orders of the defined types were placed in this encounter.  Medication changes: No orders of the defined types were placed in this encounter.    Disposition: Follow-up in 6 months  Signed, Georgeanna Leaobert J. Jamari Moten, MD, [facility] 07/03/2018 1:22 PM    [facility]

## 2018-07-03 NOTE — Patient Instructions (Signed)
Medication Instructions:  Your physician recommends that you continue on your current medications as directed. Please refer to the Current Medication list given to you today.  If you need a refill on your cardiac medications before your next appointment, please call your pharmacy.   Lab work: None.   If you have labs (blood work) drawn today and your tests are completely normal, you will receive your results only by: . MyChart Message (if you have MyChart) OR . A paper copy in the mail If you have any lab test that is abnormal or we need to change your treatment, we will call you to review the results.  Testing/Procedures: None.   Follow-Up: At [facility], you and your health needs are our priority.  As part of our continuing mission to provide you with exceptional heart care, we have created designated Provider Care Teams.  These Care Teams include your primary Cardiologist (physician) and Advanced Practice Providers (APPs -  Physician Assistants and Nurse Practitioners) who all work together to provide you with the care you need, when you need it. You will need a follow up appointment in 6 months.  Please call our office 2 months in advance to schedule this appointment.  You may see No primary care provider on file. or another member of our [facility] Provider Team in [place]: Brian Munley, MD . Rajan Revankar, MD  Any Other Special Instructions Will Be Listed Below (If Applicable).    

## 2019-01-06 ENCOUNTER — Encounter: Payer: Self-pay | Admitting: Cardiology

## 2019-01-06 ENCOUNTER — Ambulatory Visit (INDEPENDENT_AMBULATORY_CARE_PROVIDER_SITE_OTHER): Payer: Medicare Other | Admitting: Cardiology

## 2019-01-06 ENCOUNTER — Other Ambulatory Visit: Payer: Self-pay

## 2019-01-06 VITALS — BP 120/70 | HR 76 | Ht 64.0 in | Wt 155.6 lb

## 2019-01-06 DIAGNOSIS — R9431 Abnormal electrocardiogram [ECG] [EKG]: Secondary | ICD-10-CM | POA: Diagnosis not present

## 2019-01-06 DIAGNOSIS — R0789 Other chest pain: Secondary | ICD-10-CM | POA: Diagnosis not present

## 2019-01-06 DIAGNOSIS — E785 Hyperlipidemia, unspecified: Secondary | ICD-10-CM

## 2019-01-06 NOTE — Patient Instructions (Signed)
Medication Instructions:  Your physician recommends that you continue on your current medications as directed. Please refer to the Current Medication list given to you today.  *If you need a refill on your cardiac medications before your next appointment, please call your pharmacy*  Lab Work: None.  If you have labs (blood work) drawn today and your tests are completely normal, you will receive your results only by: . MyChart Message (if you have MyChart) OR . A paper copy in the mail If you have any lab test that is abnormal or we need to change your treatment, we will call you to review the results.  Testing/Procedures: None.   Follow-Up: At [facility], you and your health needs are our priority.  As part of our continuing mission to provide you with exceptional heart care, we have created designated Provider Care Teams.  These Care Teams include your primary Cardiologist (physician) and Advanced Practice Providers (APPs -  Physician Assistants and Nurse Practitioners) who all work together to provide you with the care you need, when you need it.  Your next appointment:   12 months  The format for your next appointment:   In Person  Provider:   Robert Krasowski, MD  Other Instructions  

## 2019-01-06 NOTE — Progress Notes (Signed)
Cardiology Office Note:    Date:  01/06/2019   ID:  Brandy Cruz, DOB 06/03/1950, MRN [id number]  PCP:  Ronita Hipps, MD  Cardiologist:  Jenne Campus, MD    Referring MD: Ronita Hipps, MD   Chief Complaint  Patient presents with  . Follow-up  Doing very well  History of Present Illness:    Brandy Cruz is a 68 y.o. female with atypical chest pain, stress test negative, abnormal EKG echocardiogram normal, I did see her a year ago before her surgery for hip replacement she went to surgery with no difficulties now she is very happy and satisfied with the results of her surgery.  She walks on the regular basis about a mile for about half an hour she has no difficulty doing it.  No cardiac symptoms whatsoever.  Past Medical History:  Diagnosis Date  . Anemia    hx of  . Anxiety    hx of  . Arthritis   . Gastritis   . Hemorrhoids   . Hyperlipidemia   . Hypertension     Past Surgical History:  Procedure Laterality Date  . APPENDECTOMY     2009  . CHOLECYSTECTOMY     2013  . 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 allopurinol (ZYLOPRIM) 100 MG tablet Take 100 mg by mouth daily.  Marland Kitchen ALPRAZolam (XANAX) 0.25 MG tablet Take 0.25 mg by mouth 2 (two) times daily as needed for anxiety.  . colchicine 0.6 MG tablet Take 0.6 mg by mouth daily as needed (gout).   . sucralfate (CARAFATE) 1 g tablet Take 1 g by mouth 2 (two) times daily.   . valsartan-hydrochlorothiazide (DIOVAN-HCT) 320-12.5 MG per tablet Take 1 tablet by mouth every morning.  . venlafaxine XR (EFFEXOR-XR) 37.5 MG 24 hr capsule Take 1 capsule  by mouth daily.     Allergies:   Sulfa antibiotics, Levaquin [levofloxacin in d5w], and Penicillins   Social History   Socioeconomic History  . Marital status: Married    Spouse name: Not on file  . Number of children: Not on file  . Years of education: Not on file  . Highest education level: Not on file  Occupational History  . Not on file  Social Needs  . Financial resource strain: Not on file  . Food insecurity    Worry: Not on file    Inability: Not on file  . Transportation needs    Medical: Not on file    Non-medical: Not on file  Tobacco Use  . Smoking status: Never Smoker  . Smokeless tobacco: Never Used  Substance and Sexual Activity  . Alcohol use: Not on file    Comment: occasionally  . Drug use: No  . Sexual activity: Not on file  Lifestyle  . Physical activity    Days per week: Not on file    Minutes per session: Not on file  . Stress: Not on file  Relationships  . Social Herbalist on phone: Not on file    Gets together: Not on file    Attends religious service: Not on file  Active member of club or organization: Not on file    Attends meetings of clubs or organizations: Not on file    Relationship status: Not on file  Other Topics Concern  . Not on file  Social History Narrative  . Not on file     Family History: The patient's family history includes Arthritis in her mother; Prostate cancer in her father. ROS:   Please see the history of present illness.    All 14 point review of systems negative except as described per history of present illness  EKGs/Labs/Other Studies Reviewed:      Recent Labs: 02/24/2018: ALT 20 03/06/2018: BUN 9; Creatinine, Ser 0.62; Hemoglobin 11.8; Platelets 272; Potassium 3.4; Sodium 137  Recent Lipid Panel    Component Value Date/Time   CHOL 214 (H) 02/13/2017 1710   TRIG 144 02/13/2017 1710   HDL 56 02/13/2017 1710   CHOLHDL 3.8 02/13/2017 1710   LDLCALC 129 (H) 02/13/2017 1710    Physical  Exam:    VS:  BP 120/70   Pulse 76   Ht 5\' 4"  (1.626 m)   Wt 155 lb 9.6 oz (70.6 kg)   SpO2 97%   BMI 26.71 kg/m     Wt Readings from Last 3 Encounters:  01/06/19 155 lb 9.6 oz (70.6 kg)  07/03/18 148 lb 8 oz (67.4 kg)  03/05/18 147 lb (66.7 kg)     GEN:  Well nourished, well developed in no acute distress HEENT: Normal NECK: No JVD; No carotid bruits LYMPHATICS: No lymphadenopathy CARDIAC: RRR, no murmurs, no rubs, no gallops RESPIRATORY:  Clear to auscultation without rales, wheezing or rhonchi  ABDOMEN: Soft, non-tender, non-distended MUSCULOSKELETAL:  No edema; No deformity  SKIN: Warm and dry LOWER EXTREMITIES: no swelling NEUROLOGIC:  Alert and oriented x 3 PSYCHIATRIC:  Normal affect   ASSESSMENT:    1. Atypical chest pain   2. Abnormal EKG   3. Dyslipidemia    PLAN:    In order of problems listed above:  1. Chest pain, denies having any, stress test negative after that she got hip replacement surgery with no difficulties.  Continue monitoring 2. Abnormal EKG her echocardiogram was normal, stress test was normal as well.  Continue present management 3. Dyslipidemia her HDL is 51 LDL 109 this is from October 30, 2018.  Looking at her risk profile this is acceptable cholesterol I encouraged her to exercise stick with a good diet.   Medication Adjustments/Labs and Tests Ordered: Current medicines are reviewed at length with the patient today.  Concerns regarding medicines are outlined above.  No orders of the defined types were placed in this encounter.  Medication changes: No orders of the defined types were placed in this encounter.   Signed, November 01, 2018, MD, [facility] 01/06/2019 9:24 AM    [facility]

## 2019-04-01 DIAGNOSIS — M546 Pain in thoracic spine: Secondary | ICD-10-CM | POA: Diagnosis not present

## 2019-04-01 DIAGNOSIS — Z6827 Body mass index (BMI) 27.0-27.9, adult: Secondary | ICD-10-CM | POA: Diagnosis not present

## 2019-04-08 DIAGNOSIS — R1900 Intra-abdominal and pelvic swelling, mass and lump, unspecified site: Secondary | ICD-10-CM | POA: Diagnosis not present

## 2019-04-08 DIAGNOSIS — R198 Other specified symptoms and signs involving the digestive system and abdomen: Secondary | ICD-10-CM | POA: Diagnosis not present

## 2019-04-08 DIAGNOSIS — Z7901 Long term (current) use of anticoagulants: Secondary | ICD-10-CM | POA: Diagnosis not present

## 2019-04-08 DIAGNOSIS — Z6826 Body mass index (BMI) 26.0-26.9, adult: Secondary | ICD-10-CM | POA: Diagnosis not present

## 2019-04-08 DIAGNOSIS — R140 Abdominal distension (gaseous): Secondary | ICD-10-CM | POA: Diagnosis not present

## 2019-04-08 DIAGNOSIS — R1011 Right upper quadrant pain: Secondary | ICD-10-CM | POA: Diagnosis not present

## 2019-04-09 DIAGNOSIS — M4316 Spondylolisthesis, lumbar region: Secondary | ICD-10-CM | POA: Diagnosis not present

## 2019-04-09 DIAGNOSIS — M47816 Spondylosis without myelopathy or radiculopathy, lumbar region: Secondary | ICD-10-CM | POA: Diagnosis not present

## 2019-04-21 DIAGNOSIS — D1801 Hemangioma of skin and subcutaneous tissue: Secondary | ICD-10-CM | POA: Diagnosis not present

## 2019-04-21 DIAGNOSIS — L578 Other skin changes due to chronic exposure to nonionizing radiation: Secondary | ICD-10-CM | POA: Diagnosis not present

## 2019-04-21 DIAGNOSIS — R1011 Right upper quadrant pain: Secondary | ICD-10-CM | POA: Diagnosis not present

## 2019-04-21 DIAGNOSIS — D225 Melanocytic nevi of trunk: Secondary | ICD-10-CM | POA: Diagnosis not present

## 2019-04-21 DIAGNOSIS — D2239 Melanocytic nevi of other parts of face: Secondary | ICD-10-CM | POA: Diagnosis not present

## 2019-04-21 DIAGNOSIS — R1901 Right upper quadrant abdominal swelling, mass and lump: Secondary | ICD-10-CM | POA: Diagnosis not present

## 2019-05-07 DIAGNOSIS — E785 Hyperlipidemia, unspecified: Secondary | ICD-10-CM | POA: Diagnosis not present

## 2019-05-20 DIAGNOSIS — Z01419 Encounter for gynecological examination (general) (routine) without abnormal findings: Secondary | ICD-10-CM | POA: Diagnosis not present

## 2019-05-20 DIAGNOSIS — Z6827 Body mass index (BMI) 27.0-27.9, adult: Secondary | ICD-10-CM | POA: Diagnosis not present

## 2019-05-20 DIAGNOSIS — N958 Other specified menopausal and perimenopausal disorders: Secondary | ICD-10-CM | POA: Diagnosis not present

## 2019-05-20 DIAGNOSIS — Z1231 Encounter for screening mammogram for malignant neoplasm of breast: Secondary | ICD-10-CM | POA: Diagnosis not present

## 2019-05-20 DIAGNOSIS — M8588 Other specified disorders of bone density and structure, other site: Secondary | ICD-10-CM | POA: Diagnosis not present

## 2019-06-05 DIAGNOSIS — Z471 Aftercare following joint replacement surgery: Secondary | ICD-10-CM | POA: Diagnosis not present

## 2019-06-05 DIAGNOSIS — Z96642 Presence of left artificial hip joint: Secondary | ICD-10-CM | POA: Diagnosis not present

## 2019-06-26 DIAGNOSIS — R300 Dysuria: Secondary | ICD-10-CM | POA: Diagnosis not present

## 2019-06-26 DIAGNOSIS — Z6826 Body mass index (BMI) 26.0-26.9, adult: Secondary | ICD-10-CM | POA: Diagnosis not present

## 2019-07-02 DIAGNOSIS — L570 Actinic keratosis: Secondary | ICD-10-CM | POA: Diagnosis not present

## 2019-07-02 DIAGNOSIS — L814 Other melanin hyperpigmentation: Secondary | ICD-10-CM | POA: Diagnosis not present

## 2019-07-02 DIAGNOSIS — L820 Inflamed seborrheic keratosis: Secondary | ICD-10-CM | POA: Diagnosis not present

## 2019-07-02 DIAGNOSIS — L821 Other seborrheic keratosis: Secondary | ICD-10-CM | POA: Diagnosis not present

## 2019-08-07 DIAGNOSIS — R002 Palpitations: Secondary | ICD-10-CM | POA: Diagnosis not present

## 2019-08-07 DIAGNOSIS — R5383 Other fatigue: Secondary | ICD-10-CM | POA: Diagnosis not present

## 2019-08-07 DIAGNOSIS — E7800 Pure hypercholesterolemia, unspecified: Secondary | ICD-10-CM | POA: Diagnosis not present

## 2019-08-07 DIAGNOSIS — Z6826 Body mass index (BMI) 26.0-26.9, adult: Secondary | ICD-10-CM | POA: Diagnosis not present

## 2019-08-07 DIAGNOSIS — Z79899 Other long term (current) drug therapy: Secondary | ICD-10-CM | POA: Diagnosis not present

## 2019-08-07 DIAGNOSIS — I10 Essential (primary) hypertension: Secondary | ICD-10-CM | POA: Diagnosis not present

## 2019-08-12 DIAGNOSIS — M1711 Unilateral primary osteoarthritis, right knee: Secondary | ICD-10-CM | POA: Diagnosis not present

## 2019-08-14 DIAGNOSIS — E538 Deficiency of other specified B group vitamins: Secondary | ICD-10-CM | POA: Diagnosis not present

## 2019-08-14 DIAGNOSIS — E876 Hypokalemia: Secondary | ICD-10-CM | POA: Diagnosis not present

## 2019-08-24 DIAGNOSIS — E538 Deficiency of other specified B group vitamins: Secondary | ICD-10-CM | POA: Diagnosis not present

## 2019-08-26 DIAGNOSIS — H2513 Age-related nuclear cataract, bilateral: Secondary | ICD-10-CM | POA: Diagnosis not present

## 2019-08-26 DIAGNOSIS — H16223 Keratoconjunctivitis sicca, not specified as Sjogren's, bilateral: Secondary | ICD-10-CM | POA: Diagnosis not present

## 2019-08-26 DIAGNOSIS — H40013 Open angle with borderline findings, low risk, bilateral: Secondary | ICD-10-CM | POA: Diagnosis not present

## 2019-08-26 DIAGNOSIS — H25013 Cortical age-related cataract, bilateral: Secondary | ICD-10-CM | POA: Diagnosis not present

## 2019-09-01 DIAGNOSIS — D519 Vitamin B12 deficiency anemia, unspecified: Secondary | ICD-10-CM | POA: Diagnosis not present

## 2019-09-15 DIAGNOSIS — D519 Vitamin B12 deficiency anemia, unspecified: Secondary | ICD-10-CM | POA: Diagnosis not present

## 2019-10-16 DIAGNOSIS — D519 Vitamin B12 deficiency anemia, unspecified: Secondary | ICD-10-CM | POA: Diagnosis not present

## 2019-10-20 DIAGNOSIS — H2512 Age-related nuclear cataract, left eye: Secondary | ICD-10-CM | POA: Diagnosis not present

## 2019-10-20 DIAGNOSIS — H2513 Age-related nuclear cataract, bilateral: Secondary | ICD-10-CM | POA: Diagnosis not present

## 2019-10-20 DIAGNOSIS — H18413 Arcus senilis, bilateral: Secondary | ICD-10-CM | POA: Diagnosis not present

## 2019-10-20 DIAGNOSIS — H25013 Cortical age-related cataract, bilateral: Secondary | ICD-10-CM | POA: Diagnosis not present

## 2019-10-20 DIAGNOSIS — H25043 Posterior subcapsular polar age-related cataract, bilateral: Secondary | ICD-10-CM | POA: Diagnosis not present

## 2019-11-08 DIAGNOSIS — Z20822 Contact with and (suspected) exposure to covid-19: Secondary | ICD-10-CM | POA: Diagnosis not present

## 2019-11-17 DIAGNOSIS — D519 Vitamin B12 deficiency anemia, unspecified: Secondary | ICD-10-CM | POA: Diagnosis not present

## 2019-12-07 DIAGNOSIS — H52202 Unspecified astigmatism, left eye: Secondary | ICD-10-CM | POA: Diagnosis not present

## 2019-12-07 DIAGNOSIS — H2512 Age-related nuclear cataract, left eye: Secondary | ICD-10-CM | POA: Diagnosis not present

## 2019-12-08 DIAGNOSIS — H2511 Age-related nuclear cataract, right eye: Secondary | ICD-10-CM | POA: Diagnosis not present

## 2019-12-17 DIAGNOSIS — D519 Vitamin B12 deficiency anemia, unspecified: Secondary | ICD-10-CM | POA: Diagnosis not present

## 2019-12-28 DIAGNOSIS — H2511 Age-related nuclear cataract, right eye: Secondary | ICD-10-CM | POA: Diagnosis not present

## 2019-12-28 DIAGNOSIS — H52201 Unspecified astigmatism, right eye: Secondary | ICD-10-CM | POA: Diagnosis not present

## 2020-01-18 DIAGNOSIS — D519 Vitamin B12 deficiency anemia, unspecified: Secondary | ICD-10-CM | POA: Diagnosis not present

## 2020-01-19 DIAGNOSIS — K219 Gastro-esophageal reflux disease without esophagitis: Secondary | ICD-10-CM | POA: Diagnosis not present

## 2020-01-19 DIAGNOSIS — K2960 Other gastritis without bleeding: Secondary | ICD-10-CM | POA: Diagnosis not present

## 2020-01-29 DIAGNOSIS — D649 Anemia, unspecified: Secondary | ICD-10-CM | POA: Insufficient documentation

## 2020-01-29 DIAGNOSIS — F419 Anxiety disorder, unspecified: Secondary | ICD-10-CM | POA: Insufficient documentation

## 2020-01-29 DIAGNOSIS — M1990 Unspecified osteoarthritis, unspecified site: Secondary | ICD-10-CM | POA: Insufficient documentation

## 2020-01-29 DIAGNOSIS — I10 Essential (primary) hypertension: Secondary | ICD-10-CM | POA: Insufficient documentation

## 2020-01-29 DIAGNOSIS — K649 Unspecified hemorrhoids: Secondary | ICD-10-CM | POA: Insufficient documentation

## 2020-01-29 DIAGNOSIS — E785 Hyperlipidemia, unspecified: Secondary | ICD-10-CM | POA: Insufficient documentation

## 2020-01-29 DIAGNOSIS — K2970 Gastritis, unspecified, without bleeding: Secondary | ICD-10-CM | POA: Insufficient documentation

## 2020-02-01 ENCOUNTER — Ambulatory Visit: Payer: Medicare PPO | Admitting: Cardiology

## 2020-02-01 ENCOUNTER — Encounter: Payer: Self-pay | Admitting: Cardiology

## 2020-02-01 ENCOUNTER — Other Ambulatory Visit: Payer: Self-pay

## 2020-02-01 VITALS — BP 116/70 | HR 77 | Ht 64.0 in | Wt 157.0 lb

## 2020-02-01 DIAGNOSIS — E782 Mixed hyperlipidemia: Secondary | ICD-10-CM

## 2020-02-01 DIAGNOSIS — E785 Hyperlipidemia, unspecified: Secondary | ICD-10-CM

## 2020-02-01 DIAGNOSIS — R0789 Other chest pain: Secondary | ICD-10-CM | POA: Diagnosis not present

## 2020-02-01 DIAGNOSIS — I10 Essential (primary) hypertension: Secondary | ICD-10-CM | POA: Diagnosis not present

## 2020-02-01 MED ORDER — ATORVASTATIN CALCIUM 10 MG PO TABS: 10.0000 mg | ORAL_TABLET | Freq: Every day | ORAL | 1 refills | Status: DC

## 2020-02-01 NOTE — Progress Notes (Signed)
Cardiology Office Note:    Date:  02/01/2020   ID:  TONICA BRASINGTON, DOB 25-Dec-1950, MRN [id number]  PCP:  Marylen Ponto, MD  Cardiologist:  Gypsy Balsam, MD    Referring MD: Marylen Ponto, MD   Chief Complaint  Patient presents with   Follow-up  Doing very well  History of Present Illness:    Brandy Cruz is a 69 y.o. female with past medical history significant for atypical chest pain, she did have a stress test done which was negative.  She also history of dyslipidemia.  She comes today 2 months for follow-up.  Overall she is doing great asymptomatic, denies have any chest pain tightness squeezing pressure burning chest.  Last year she had hip replacement doing well.  She used to walk on the regular basis but does not do much lately.  She is understand that there is a problem.  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   Hyperlipidemia    Hypertension    OA (osteoarthritis) of knee 10/18/2014   Osteoarthritis of right knee 10/18/2014   Pain of left hip joint 06/11/2017    Past Surgical History:  Procedure Laterality Date   APPENDECTOMY     2009   CHOLECYSTECTOMY     2013   TOTAL HIP ARTHROPLASTY Left 03/05/2018   Procedure: TOTAL HIP ARTHROPLASTY ANTERIOR APPROACH;  Surgeon: Ollen Gross, MD;  Location: [facility];  Service: Orthopedics;  Laterality: Left;   TOTAL KNEE ARTHROPLASTY Left 10/18/2014   Procedure: LEFT TOTAL KNEE ARTHROPLASTY;  Surgeon: Ollen Gross, MD;  Location: [facility];  Service: Orthopedics;  Laterality: Left;    Current Medications: Current Meds  Medication Sig   allopurinol (ZYLOPRIM) 100 MG tablet Take 100 mg by mouth daily.   ALPRAZolam (XANAX) 0.25 MG tablet Take 0.25 mg by mouth 2 (two) times daily  as needed for anxiety.   amLODipine (NORVASC) 2.5 MG tablet Take 2.5 mg by mouth at bedtime.   colchicine 0.6 MG tablet Take 0.6 mg by mouth daily as needed (gout).    pantoprazole (PROTONIX) 40 MG tablet Take 40 mg by mouth daily.   sucralfate (CARAFATE) 1 g tablet Take 1 g by mouth 2 (two) times daily.    valsartan-hydrochlorothiazide (DIOVAN-HCT) 320-12.5 MG per tablet Take 1 tablet by mouth every morning.   venlafaxine XR (EFFEXOR-XR) 37.5 MG 24 hr capsule Take 1 capsule by mouth daily.     Allergies:   Sulfa antibiotics, Levaquin [levofloxacin in d5w], and Penicillins   Social History   Socioeconomic History   Marital status: Married    Spouse name: Not on file   Number of children: Not on file   Years of education: Not on file   Highest education level: Not on file  Occupational History   Not on file  Tobacco Use   Smoking status: Never Smoker   Smokeless tobacco: Never Used  Substance and Sexual Activity   Alcohol use: Not on file    Comment: occasionally   Drug use: No   Sexual activity: Not on file  Other Topics Concern   Not on file  Social History Narrative   Not on file   Social Determinants of  Health   Financial Resource Strain:    Difficulty of Paying Living Expenses: Not on file  Food Insecurity:    Worried About Running Out of Food in the Last Year: Not on file   Ran Out of Food in the Last Year: Not on file  Transportation Needs:    Lack of Transportation (Medical): Not on file   Lack of Transportation (Non-Medical): Not on file  Physical Activity:    Days of Exercise per Week: Not on file   Minutes of Exercise per Session: Not on file  Stress:    Feeling of Stress : Not on file  Social Connections:    Frequency of Communication with Friends and Family: Not on file   Frequency of Social Gatherings with Friends and Family: Not on file   Attends Religious Services: Not on file   Active Member of Clubs or Organizations:  Not on file   Attends Banker Meetings: Not on file   Marital Status: Not on file     Family History: The patient's family history includes Arthritis in her mother; Prostate cancer in her father. ROS:   Please see the history of present illness.    All 14 point review of systems negative except as described per history of present illness  EKGs/Labs/Other Studies Reviewed:      Recent Labs: No results found for requested labs within last 8760 hours.  Recent Lipid Panel    Component Value Date/Time   CHOL 214 (H) 02/13/2017 1710   TRIG 144 02/13/2017 1710   HDL 56 02/13/2017 1710   CHOLHDL 3.8 02/13/2017 1710   LDLCALC 129 (H) 02/13/2017 1710    Physical Exam:    VS:  BP 116/70 (BP Location: Left Arm, Patient Position: Sitting)    Pulse 77    Ht 5\' 4"  (1.626 m)    Wt 157 lb (71.2 kg)    SpO2 95%    BMI 26.95 kg/m     Wt Readings from Last 3 Encounters:  02/01/20 157 lb (71.2 kg)  01/06/19 155 lb 9.6 oz (70.6 kg)  07/03/18 148 lb 8 oz (67.4 kg)     GEN:  Well nourished, well developed in no acute distress HEENT: Normal NECK: No JVD; No carotid bruits LYMPHATICS: No lymphadenopathy CARDIAC: RRR, no murmurs, no rubs, no gallops RESPIRATORY:  Clear to auscultation without rales, wheezing or rhonchi  ABDOMEN: Soft, non-tender, non-distended MUSCULOSKELETAL:  No edema; No deformity  SKIN: Warm and dry LOWER EXTREMITIES: no swelling NEUROLOGIC:  Alert and oriented x 3 PSYCHIATRIC:  Normal affect   ASSESSMENT:    1. Atypical chest pain   2. Mixed hyperlipidemia   3. Primary hypertension   4. Dyslipidemia    PLAN:    In order of problems listed above:  1. Atypical chest pain: Denies having any, stress test reviewed which was negative. 2. Mixed dyslipidemia: I calculated her 10 years predicted risk for coronary event which is 12.1 percent.  This is intermediate risk and statin should be considered.  I offer her Lipitor 10 mg daily and she accepted.  I  will put an order for fasting lipid profile as well as HDL to be checked within the next 6 weeks. 3. Essential hypertension: Blood pressure well controlled continue present management. 4. We did talk about healthy lifestyle need to exercise on the regular basis recommitted and she will do it.  We did also talk about good eating habits.   Medication Adjustments/Labs and Tests Ordered: Current  medicines are reviewed at length with the patient today.  Concerns regarding medicines are outlined above.  No orders of the defined types were placed in this encounter.  Medication changes: No orders of the defined types were placed in this encounter.   Signed, Georgeanna Lea, MD, [facility] 02/01/2020 3:52 PM    [facility]

## 2020-02-01 NOTE — Addendum Note (Signed)
Addended by: Hazle Quant on: 02/01/2020 04:04 PM   Modules accepted: Orders

## 2020-02-01 NOTE — Patient Instructions (Addendum)
Medication Instructions:  Your physician has recommended you make the following change in your medication:  START: Lipitor 10 mg daily   *If you need a refill on your cardiac medications before your next appointment, please call your pharmacy*   Lab Work: Your physician recommends that you return for lab work in 6 weeks: lipid lft  If you have labs (blood work) drawn today and your tests are completely normal, you will receive your results only by: Marland Kitchen MyChart Message (if you have MyChart) OR . A paper copy in the mail If you have any lab test that is abnormal or we need to change your treatment, we will call you to review the results.   Testing/Procedures: None   Follow-Up: At [facility], you and your health needs are our priority.  As part of our continuing mission to provide you with exceptional heart care, we have created designated Provider Care Teams.  These Care Teams include your primary Cardiologist (physician) and Advanced Practice Providers (APPs -  Physician Assistants and Nurse Practitioners) who all work together to provide you with the care you need, when you need it.  We recommend signing up for the patient portal called "MyChart".  Sign up information is provided on this After Visit Summary.  MyChart is used to connect with patients for Virtual Visits (Telemedicine).  Patients are able to view lab/test results, encounter notes, upcoming appointments, etc.  Non-urgent messages can be sent to your provider as well.   To learn more about what you can do with MyChart, go to [email or web address].    Your next appointment:   1 year(s)  The format for your next appointment:   In Person  Provider:   Gypsy Balsam, MD   Other Instructions  Atorvastatin tablets What is this medicine? ATORVASTATIN (a TORE va sta tin) is known as a HMG-CoA reductase inhibitor or 'statin'. It lowers the level of cholesterol and triglycerides in the blood. This drug may also reduce  the risk of heart attack, stroke, or other health problems in patients with risk factors for heart disease. Diet and lifestyle changes are often used with this drug. This medicine may be used for other purposes; ask your health care provider or pharmacist if you have questions. COMMON BRAND NAME(S): Lipitor What should I tell my health care provider before I take this medicine? They need to know if you have any of these conditions:  diabetes  if you often drink alcohol  history of stroke  kidney disease  liver disease  muscle aches or weakness  thyroid disease  an unusual or allergic reaction to atorvastatin, other medicines, foods, dyes, or preservatives  pregnant or trying to get pregnant  breast-feeding How should I use this medicine? Take this medicine by mouth with a glass of water. Follow the directions on the prescription label. You can take it with or without food. If it upsets your stomach, take it with food. Do not take with grapefruit juice. Take your medicine at regular intervals. Do not take it more often than directed. Do not stop taking except on your doctor's advice. Talk to your pediatrician regarding the use of this medicine in children. While this drug may be prescribed for children as young as 10 for selected conditions, precautions do apply. Overdosage: If you think you have taken too much of this medicine contact a poison control center or emergency room at once. NOTE: This medicine is only for you. Do not share this medicine with others. What if  I miss a dose? If you miss a dose, take it as soon as you can. If your next dose is to be taken in less than 12 hours, then do not take the missed dose. Take the next dose at your regular time. Do not take double or extra doses. What may interact with this medicine? Do not take this medicine with any of the following medications:  dasabuvir; ombitasvir; paritaprevir; ritonavir  ombitasvir; paritaprevir;  ritonavir  posaconazole  red yeast rice This medicine may also interact with the following medications:  alcohol  birth control pills  certain antibiotics like erythromycin and clarithromycin  certain antivirals for HIV or hepatitis  certain medicines for cholesterol like fenofibrate, gemfibrozil, and niacin  certain medicines for fungal infections like ketoconazole and itraconazole  colchicine  cyclosporine  digoxin  grapefruit juice  rifampin This list may not describe all possible interactions. Give your health care provider a list of all the medicines, herbs, non-prescription drugs, or dietary supplements you use. Also tell them if you smoke, drink alcohol, or use illegal drugs. Some items may interact with your medicine. What should I watch for while using this medicine? Visit your doctor or health care professional for regular check-ups. You may need regular tests to make sure your liver is working properly. Your health care professional may tell you to stop taking this medicine if you develop muscle problems. If your muscle problems do not go away after stopping this medicine, contact your health care professional. Do not become pregnant while taking this medicine. Women should inform their health care professional if they wish to become pregnant or think they might be pregnant. There is a potential for serious side effects to an unborn child. Talk to your health care professional or pharmacist for more information. Do not breast-feed an infant while taking this medicine. This medicine may increase blood sugar. Ask your healthcare provider if changes in diet or medicines are needed if you have diabetes. If you are going to need surgery or other procedure, tell your doctor that you are using this medicine. This drug is only part of a total heart-health program. Your doctor or a dietician can suggest a low-cholesterol and low-fat diet to help. Avoid alcohol and smoking, and  keep a proper exercise schedule. This medicine may cause a decrease in Co-Enzyme Q-10. You should make sure that you get enough Co-Enzyme Q-10 while you are taking this medicine. Discuss the foods you eat and the vitamins you take with your health care professional. What side effects may I notice from receiving this medicine? Side effects that you should report to your doctor or health care professional as soon as possible:  allergic reactions like skin rash, itching or hives, swelling of the face, lips, or tongue  fever  joint pain  loss of memory  redness, blistering, peeling or loosening of the skin, including inside the mouth  signs and symptoms of high blood sugar such as being more thirsty or hungry or having to urinate more than normal. You may also feel very tired or have blurry vision.  signs and symptoms of liver injury like dark yellow or brown urine; general ill feeling or flu-like symptoms; light-belly pain; unusually weak or tired; yellowing of the eyes or skin  signs and symptoms of muscle injury like dark urine; trouble passing urine or change in the amount of urine; unusually weak or tired; muscle pain or side or back pain Side effects that usually do not require medical attention (report  to your doctor or health care professional if they continue or are bothersome):  diarrhea  nausea  stomach pain  trouble sleeping  upset stomach This list may not describe all possible side effects. Call your doctor for medical advice about side effects. You may report side effects to FDA at 1-800-FDA-1088. Where should I keep my medicine? Keep out of the reach of children. Store between 20 and 25 degrees C (68 and 77 degrees F). Throw away any unused medicine after the expiration date. NOTE: This sheet is a summary. It may not cover all possible information. If you have questions about this medicine, talk to your doctor, pharmacist, or health care provider.  2020 Elsevier/Gold  Standard (2017-12-04 11:36:16)

## 2020-02-02 DIAGNOSIS — L820 Inflamed seborrheic keratosis: Secondary | ICD-10-CM | POA: Diagnosis not present

## 2020-02-13 ENCOUNTER — Encounter: Payer: Self-pay | Admitting: Cardiology

## 2020-02-13 DIAGNOSIS — R079 Chest pain, unspecified: Secondary | ICD-10-CM | POA: Diagnosis not present

## 2020-02-13 DIAGNOSIS — J189 Pneumonia, unspecified organism: Secondary | ICD-10-CM | POA: Diagnosis not present

## 2020-02-13 DIAGNOSIS — R0789 Other chest pain: Secondary | ICD-10-CM | POA: Diagnosis not present

## 2020-02-13 NOTE — Progress Notes (Signed)
[facility] health ED chest pain if discharged home need to get in the office on Monday or Tuesday for follow-up.

## 2020-02-15 ENCOUNTER — Other Ambulatory Visit: Payer: Self-pay

## 2020-02-15 ENCOUNTER — Telehealth: Payer: Self-pay | Admitting: Cardiology

## 2020-02-15 ENCOUNTER — Ambulatory Visit: Payer: Medicare PPO | Admitting: Cardiology

## 2020-02-15 ENCOUNTER — Encounter: Payer: Self-pay | Admitting: Cardiology

## 2020-02-15 VITALS — BP 128/70 | HR 82 | Ht 64.0 in | Wt 155.0 lb

## 2020-02-15 DIAGNOSIS — I10 Essential (primary) hypertension: Secondary | ICD-10-CM

## 2020-02-15 DIAGNOSIS — E782 Mixed hyperlipidemia: Secondary | ICD-10-CM | POA: Diagnosis not present

## 2020-02-15 DIAGNOSIS — R079 Chest pain, unspecified: Secondary | ICD-10-CM

## 2020-02-15 DIAGNOSIS — M4124 Other idiopathic scoliosis, thoracic region: Secondary | ICD-10-CM | POA: Diagnosis not present

## 2020-02-15 MED ORDER — METOPROLOL TARTRATE 100 MG PO TABS: 100.0000 mg | ORAL_TABLET | Freq: Once | ORAL | 0 refills | Status: DC

## 2020-02-15 NOTE — Telephone Encounter (Signed)
Called patient she is aware of her appointment today.

## 2020-02-15 NOTE — Telephone Encounter (Signed)
Patient was seen at [facility], states she was told by Dr. Hyacinth Meeker there that she needs to be seen today if at all possible. She says that Dr. Hyacinth Meeker spoke with Dr. Dulce Sellar on Saturday. Please advise.

## 2020-02-15 NOTE — Progress Notes (Signed)
Cardiology Office Note:    Date:  02/15/2020   ID:  Brandy Cruz, DOB 09-19-1950, MRN [id number]  PCP:  Marylen Ponto, MD  Cardiologist:  Norman Herrlich, MD    Referring MD: Marylen Ponto, MD    ASSESSMENT:    1. Chest pain, unspecified type   2. Other idiopathic scoliosis, thoracic region   3. Mixed hyperlipidemia   4. Primary hypertension    PLAN:    In order of problems listed above:  1. Further evaluation cardiac CTA in the interim Tylenol 2 of the 5 grain equal 325 mg twice daily 2. If cardiac CTA unremarkable I suspect she has chest wall musculoskeletal pain and may benefit from physical therapy modalities 3. Continue her statin 4. Lipids at target continue combination ACE thiazide diuretic   Next appointment: 4 weeks after cardiac CTA   Medication Adjustments/Labs and Tests Ordered: Current medicines are reviewed at length with the patient today.  Concerns regarding medicines are outlined above.  No orders of the defined types were placed in this encounter.  No orders of the defined types were placed in this encounter.   Chief complaint follow-up [facility] health ED visit  History of Present Illness:    Brandy Cruz is a 69 y.o. female with a hx of chest pain felt to be noncardiac with previous normal stress test last seen by Dr. Bing Matter 02/01/2020.  She presented to [facility] health this weekend with chest discomfort seen in the ED troponin EKG normal I advised admission she opted for discharge I was called and asked to facilitate early office follow-up.  Her cardiovascular risk include age hypertension hyper lipidemia, echocardiogram performed in 2019 showed left ventricular hypertrophy graded as moderate normal ejection fraction and normal diastolic filling pressure.  The myocardial perfusion study was performed at [facility] and I cannot pull a copy up in epic but it is related is normal.  He also had an emergency room visit [facility] February  2017.Marland Kitchen Compliance with diet, lifestyle and medications: Yes  She has had chest pain now for 3 days its not severe is located in the left anterior chest up into the axilla it is a little bit burning is low aching its not exertional not relieved with rest not positional not pleuritic she has had no rash fever chills no cough shortness of breath and she was given a prescription for Zithromax single dose from the ED.  She has no chest wall tenderness but she has scoliosis on physical examination.  We discussed further evaluation I advocated for undergo a cardiac CTA benefit risk options detailed she has no dye allergy normal renal function and agrees.  In the interim I had wanted to put her on nonsteroidal but she tells me she does not tolerate it so have her take Tylenol 10 grains twice daily.  Her symptoms may be musculoskeletal and may be related to scoliosis.  Reviewed the ED record her EKG showed sinus rhythm nonspecific ST abnormality chest x-ray showed abnormality of right base atelectasis versus infiltrate EKG done in 2019 had minor ST will recheck 1 hour office today Past Medical History:  Diagnosis Date  . Anemia    hx of  . Anxiety    hx of  . Arthritis   . Atypical chest pain 02/12/2017  . Dyslipidemia 03/18/2017  . Electrocardiogram abnormal 02/13/2017  . Gastritis   . Hemorrhoids   . 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 Hyperlipidemia   .  Hypertension   . OA (osteoarthritis) of knee 10/18/2014  . Osteoarthritis of right knee 10/18/2014  . Pain of left hip joint 06/11/2017    Past Surgical History:  Procedure Laterality Date  . APPENDECTOMY     2009  . CHOLECYSTECTOMY     2013  . 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 TOTAL KNEE ARTHROPLASTY Left 10/18/2014   Procedure: LEFT TOTAL KNEE ARTHROPLASTY;  Surgeon: Ollen Gross, MD;  Location: [facility];  Service: Orthopedics;  Laterality: Left;    Current Medications: Current Meds  Medication Sig  . allopurinol (ZYLOPRIM) 100 MG tablet Take 100 mg by mouth daily.  Marland Kitchen ALPRAZolam (XANAX) 0.25 MG tablet Take 0.25 mg by mouth 2 (two) times daily as needed for anxiety.  Marland Kitchen amLODipine (NORVASC) 2.5 MG tablet Take 2.5 mg by mouth at bedtime.  Marland Kitchen atorvastatin (LIPITOR) 10 MG tablet Take 1 tablet (10 mg total) by mouth daily.  . sucralfate (CARAFATE) 1 g tablet Take 1 g by mouth 2 (two) times daily.   . valsartan-hydrochlorothiazide (DIOVAN-HCT) 320-12.5 MG per tablet Take 1 tablet by mouth every morning.  . venlafaxine XR (EFFEXOR-XR) 37.5 MG 24 hr capsule Take 1 capsule by mouth daily.     Allergies:   Sulfa antibiotics, Levaquin [levofloxacin in d5w], and Penicillins   Social History   Socioeconomic History  . Marital status: Married    Spouse name: Not on file  . Number of children: Not on file  . Years of education: Not on file  . Highest education level: Not on file  Occupational History  . Not on file  Tobacco Use  . Smoking status: Never Smoker  . Smokeless tobacco: Never Used  Substance and Sexual Activity  . Alcohol use: Not on file    Comment: occasionally  . Drug use: No  . Sexual activity: Not on file  Other Topics Concern  . Not on file  Social History Narrative  . Not on file   Social Determinants of Health   Financial Resource Strain: Not on file  Food Insecurity: Not on file  Transportation Needs: Not on file  Physical Activity: Not on file  Stress: Not on file  Social Connections: Not on file     Family History: The patient's family history includes Arthritis in her mother; Prostate cancer in her father. ROS:   Please see the history of present illness.    All other systems reviewed and are negative.  EKGs/Labs/Other Studies Reviewed:    The following studies were reviewed today:  Her EKG today is at baseline sinus  rhythm minor nonspecific ST and T waves.  Recent Labs: No results found for requested labs within last 8760 hours.  Recent Lipid Panel    Component Value Date/Time   CHOL 214 (H) 02/13/2017 1710   TRIG 144 02/13/2017 1710   HDL 56 02/13/2017 1710   CHOLHDL 3.8 02/13/2017 1710   LDLCALC 129 (H) 02/13/2017 1710    Physical Exam:    VS:  BP 128/70   Pulse 82   Ht 5\' 4"  (1.626 m)   Wt 155 lb (70.3 kg)   SpO2 98%   BMI 26.61 kg/m     Wt Readings from Last 3 Encounters:  02/15/20 155 lb (70.3 kg)  02/01/20 157 lb (71.2 kg)  01/06/19 155 lb 9.6 oz (70.6 kg)  GEN:  Well nourished, well developed in no acute distress HEENT: Normal NECK: No JVD; No carotid bruits LYMPHATICS: No lymphadenopathy CARDIAC: RRR, no murmurs, rubs, gallops RESPIRATORY:  Clear to auscultation without rales, wheezing or rhonchi  ABDOMEN: Soft, non-tender, non-distended MUSCULOSKELETAL:  No edema; No deformity  SKIN: Warm and dry NEUROLOGIC:  Alert and oriented x 3 PSYCHIATRIC:  Normal affect    Signed, Norman Herrlich, MD  02/15/2020 2:06 PM    [facility]

## 2020-02-15 NOTE — Patient Instructions (Signed)
Medication Instructions:  Your physician has recommended you make the following change in your medication:   Take: Tylenol 650 mg twice daily for 1 week.  *If you need a refill on your cardiac medications before your next appointment, please call your pharmacy*   Lab Work: Your physician recommends that you return for lab work 3-7 days before ct: bmp  If you have labs (blood work) drawn today and your tests are completely normal, you will receive your results only by: Marland Kitchen MyChart Message (if you have MyChart) OR . A paper copy in the mail If you have any lab test that is abnormal or we need to change your treatment, we will call you to review the results.   Testing/Procedures: .Your cardiac CT will be scheduled at one of the below locations:   [facility] [address] [place], [place] [address] [phone]  [place] [address] [place],  [id number] [phone]  If scheduled at [facility], please arrive at the [facility] main entrance of [facility] 30 minutes prior to test start time. Proceed to the [facility] Radiology Department (first floor) to check-in and test prep.  If scheduled at [facility], please arrive 15 mins early for check-in and test prep.  Please follow these instructions carefully (unless otherwise directed):    On the Night Before the Test: . Be sure to Drink plenty of water. . Do not consume any caffeinated/decaffeinated beverages or chocolate 12 hours prior to your test. . Do not take any antihistamines 12 hours prior to your test. .  On the Day of the Test: . Drink plenty of water. Do not drink any water within one hour of the test. . Do not eat any food 4 hours prior to the test. . You may take your regular medications prior to the test.  . Take metoprolol (Lopressor) two hours prior to test. . HOLD  valsartan/Hydrochlorothiazide morning of the test. . FEMALES- please wear underwire-free bra if available       After the Test: . Drink plenty of water. . After receiving IV contrast, you may experience a mild flushed feeling. This is normal. . On occasion, you may experience a mild rash up to 24 hours after the test. This is not dangerous. If this occurs, you can take Benadryl 25 mg and increase your fluid intake. . If you experience trouble breathing, this can be serious. If it is severe call 911 IMMEDIATELY. If it is mild, please call our office. . If you take any of these medications: Glipizide/Metformin, Avandament, Glucavance, please do not take 48 hours after completing test unless otherwise instructed.   Once we have confirmed authorization from your insurance company, we will call you to set up a date and time for your test. Based on how quickly your insurance processes prior authorizations requests, please allow up to 4 weeks to be contacted for scheduling your Cardiac CT appointment. Be advised that routine Cardiac CT appointments could be scheduled as many as 8 weeks after your provider has ordered it.  For non-scheduling related questions, please contact the cardiac imaging nurse navigator should you have any questions/concerns: Marchia Bond, Cardiac Imaging Nurse Navigator Burley Saver, Interim Cardiac Imaging Nurse [facility] Direct Office Dial: [phone]   For scheduling needs, including cancellations and rescheduling, please call [place], [phone].     Follow-Up: At [facility], you and your  health needs are our priority.  As part of our continuing mission to provide you with exceptional heart care, we have created designated Provider Care Teams.  These Care Teams include your primary Cardiologist (physician) and Advanced Practice Providers (APPs -  Physician Assistants and Nurse Practitioners) who all work together to provide  you with the care you need, when you need it.  We recommend signing up for the patient portal called "MyChart".  Sign up information is provided on this After Visit Summary.  MyChart is used to connect with patients for Virtual Visits (Telemedicine).  Patients are able to view lab/test results, encounter notes, upcoming appointments, etc.  Non-urgent messages can be sent to your provider as well.   To learn more about what you can do with MyChart, go to [email or web address].    Your next appointment:   4 week(s)  The format for your next appointment:   In Person  Provider:   Jenne Campus, MD   Other Instructions   Cardiac CT Angiogram A cardiac CT angiogram is a procedure to look at the heart and the area around the heart. It may be done to help find the cause of chest pains or other symptoms of heart disease. During this procedure, a substance called contrast dye is injected into the blood vessels in the area to be checked. A large X-ray machine, called a CT scanner, then takes detailed pictures of the heart and the surrounding area. The procedure is also sometimes called a coronary CT angiogram, coronary artery scanning, or CTA. A cardiac CT angiogram allows the health care provider to see how well blood is flowing to and from the heart. The health care provider will be able to see if there are any problems, such as:  Blockage or narrowing of the coronary arteries in the heart.  Fluid around the heart.  Signs of weakness or disease in the muscles, valves, and tissues of the heart. Tell a health care provider about:  Any allergies you have. This is especially important if you have had a previous allergic reaction to contrast dye.  All medicines you are taking, including vitamins, herbs, eye drops, creams, and over-the-counter medicines.  Any blood disorders you have.  Any surgeries you have had.  Any medical conditions you have.  Whether you are pregnant or may be  pregnant.  Any anxiety disorders, chronic pain, or other conditions you have that may increase your stress or prevent you from lying still. What are the risks? Generally, this is a safe procedure. However, problems may occur, including:  Bleeding.  Infection.  Allergic reactions to medicines or dyes.  Damage to other structures or organs.  Kidney damage from the contrast dye that is used.  Increased risk of cancer from radiation exposure. This risk is low. Talk with your health care provider about: ? The risks and benefits of testing. ? How you can receive the lowest dose of radiation. What happens before the procedure?  Wear comfortable clothing and remove any jewelry, glasses, dentures, and hearing aids.  Follow instructions from your health care provider about eating and drinking. This may include: ? For 12 hours before the procedure -- avoid caffeine. This includes tea, coffee, soda, energy drinks, and diet pills. Drink plenty of water or other fluids that do not have caffeine in them. Being well hydrated can prevent complications. ? For 4-6 hours before the procedure -- stop eating and drinking. The contrast dye can cause nausea, but this is less likely if your  stomach is empty.  Ask your health care provider about changing or stopping your regular medicines. This is especially important if you are taking diabetes medicines, blood thinners, or medicines to treat problems with erections (erectile dysfunction). What happens during the procedure?   Hair on your chest may need to be removed so that small sticky patches called electrodes can be placed on your chest. These will transmit information that helps to monitor your heart during the procedure.  An IV will be inserted into one of your veins.  You might be given a medicine to control your heart rate during the procedure. This will help to ensure that good images are obtained.  You will be asked to lie on an exam table. This  table will slide in and out of the CT machine during the procedure.  Contrast dye will be injected into the IV. You might feel warm, or you may get a metallic taste in your mouth.  You will be given a medicine called nitroglycerin. This will relax or dilate the arteries in your heart.  The table that you are lying on will move into the CT machine tunnel for the scan.  The person running the machine will give you instructions while the scans are being done. You may be asked to: ? Keep your arms above your head. ? Hold your breath. ? Stay very still, even if the table is moving.  When the scanning is complete, you will be moved out of the machine.  The IV will be removed. The procedure may vary among health care providers and hospitals. What can I expect after the procedure? After your procedure, it is common to have:  A metallic taste in your mouth from the contrast dye.  A feeling of warmth.  A headache from the nitroglycerin. Follow these instructions at home:  Take over-the-counter and prescription medicines only as told by your health care provider.  If you are told, drink enough fluid to keep your urine pale yellow. This will help to flush the contrast dye out of your body.  Most people can return to their normal activities right after the procedure. Ask your health care provider what activities are safe for you.  It is up to you to get the results of your procedure. Ask your health care provider, or the department that is doing the procedure, when your results will be ready.  Keep all follow-up visits as told by your health care provider. This is important. Contact a health care provider if:  You have any symptoms of allergy to the contrast dye. These include: ? Shortness of breath. ? Rash or hives. ? A racing heartbeat. Summary  A cardiac CT angiogram is a procedure to look at the heart and the area around the heart. It may be done to help find the cause of chest  pains or other symptoms of heart disease.  During this procedure, a large X-ray machine, called a CT scanner, takes detailed pictures of the heart and the surrounding area after a contrast dye has been injected into blood vessels in the area.  Ask your health care provider about changing or stopping your regular medicines before the procedure. This is especially important if you are taking diabetes medicines, blood thinners, or medicines to treat erectile dysfunction.  If you are told, drink enough fluid to keep your urine pale yellow. This will help to flush the contrast dye out of your body. This information is not intended to replace advice given  to you by your health care provider. Make sure you discuss any questions you have with your health care provider. Document Revised: 10/08/2018 Document Reviewed: 10/08/2018 Elsevier Patient Education  [place].

## 2020-02-29 ENCOUNTER — Telehealth: Payer: Self-pay

## 2020-02-29 DIAGNOSIS — I10 Essential (primary) hypertension: Secondary | ICD-10-CM | POA: Diagnosis not present

## 2020-02-29 DIAGNOSIS — E782 Mixed hyperlipidemia: Secondary | ICD-10-CM | POA: Diagnosis not present

## 2020-02-29 DIAGNOSIS — M4124 Other idiopathic scoliosis, thoracic region: Secondary | ICD-10-CM | POA: Diagnosis not present

## 2020-02-29 DIAGNOSIS — R079 Chest pain, unspecified: Secondary | ICD-10-CM | POA: Diagnosis not present

## 2020-02-29 LAB — BASIC METABOLIC PANEL
BUN/Creatinine Ratio: 14 (ref 12–28)
BUN: 9 mg/dL (ref 8–27)
CO2: 27 mmol/L (ref 20–29)
Calcium: 9.3 mg/dL (ref 8.7–10.3)
Chloride: 96 mmol/L (ref 96–106)
Creatinine, Ser: 0.64 mg/dL (ref 0.57–1.00)
GFR calc Af Amer: 104 mL/min/{1.73_m2} (ref >59)
GFR calc non Af Amer: 90 mL/min/{1.73_m2} (ref >59)
Glucose: 94 mg/dL (ref 65–99)
Potassium: 3.7 mmol/L (ref 3.5–5.2)
Sodium: 137 mmol/L (ref 134–144)

## 2020-02-29 NOTE — Telephone Encounter (Signed)
Spoke with patient regarding results and recommendation.  Patient verbalizes understanding and is agreeable to plan of care. Advised patient to call back with any issues or concerns.  

## 2020-02-29 NOTE — Telephone Encounter (Signed)
-----   Message from Baldo Daub, MD sent at 02/29/2020  4:45 PM EST ----- Normal BMP no changes in treatment

## 2020-03-03 ENCOUNTER — Telehealth (HOSPITAL_COMMUNITY): Payer: Self-pay | Admitting: *Deleted

## 2020-03-03 NOTE — Telephone Encounter (Signed)
Reaching out to patient to offer assistance regarding upcoming cardiac imaging study; pt verbalizes understanding of appt date/time, parking situation and where to check in, pre-test NPO status and medications ordered, and verified current allergies; name and call back number provided for further questions should they arise  Jaden Batchelder RN Navigator Cardiac Imaging [facility] [phone] office [phone] cell  

## 2020-03-07 ENCOUNTER — Other Ambulatory Visit: Payer: Self-pay

## 2020-03-07 ENCOUNTER — Ambulatory Visit (HOSPITAL_COMMUNITY)
Admission: RE | Admit: 2020-03-07 | Discharge: 2020-03-07 | Disposition: A | Discharge status: Home / Self Care | Payer: Medicare PPO | Source: Ambulatory Visit | Attending: Cardiology | Admitting: Cardiology

## 2020-03-07 ENCOUNTER — Encounter (HOSPITAL_COMMUNITY): Payer: Self-pay

## 2020-03-07 ENCOUNTER — Encounter: Payer: Medicare PPO | Admitting: *Deleted

## 2020-03-07 DIAGNOSIS — E782 Mixed hyperlipidemia: Secondary | ICD-10-CM | POA: Insufficient documentation

## 2020-03-07 DIAGNOSIS — M4124 Other idiopathic scoliosis, thoracic region: Secondary | ICD-10-CM | POA: Diagnosis not present

## 2020-03-07 DIAGNOSIS — I700 Atherosclerosis of aorta: Secondary | ICD-10-CM | POA: Diagnosis not present

## 2020-03-07 DIAGNOSIS — I10 Essential (primary) hypertension: Secondary | ICD-10-CM | POA: Diagnosis not present

## 2020-03-07 DIAGNOSIS — R079 Chest pain, unspecified: Secondary | ICD-10-CM | POA: Diagnosis not present

## 2020-03-07 DIAGNOSIS — Z006 Encounter for examination for normal comparison and control in clinical research program: Secondary | ICD-10-CM

## 2020-03-07 MED ORDER — IOHEXOL 350 MG/ML SOLN
80.0000 mL | Freq: Once | INTRAVENOUS | Status: AC | PRN
  Administered 2020-03-07: 80 mL via INTRAVENOUS

## 2020-03-07 MED ORDER — NITROGLYCERIN 0.4 MG SL SUBL
SUBLINGUAL_TABLET | SUBLINGUAL | Status: AC
  Administered 2020-03-07: 0.8 mg
  Filled 2020-03-07: qty 2

## 2020-03-07 NOTE — Research (Signed)
Subject Name: Brandy Cruz  Subject met inclusion and exclusion criteria.  The informed consent form, study requirements and expectations were reviewed with the subject and questions and concerns were addressed prior to the signing of the consent form.  The subject verbalized understanding of the trial requirements.  The subject agreed to participate in the IDENTIFY trial and signed the informed consent at [place] on 03/07/20  The informed consent was obtained prior to performance of any protocol-specific procedures for the subject.  A copy of the signed informed consent was given to the subject and a copy was placed in the subject's medical record.   [name]

## 2020-03-11 DIAGNOSIS — E782 Mixed hyperlipidemia: Secondary | ICD-10-CM | POA: Diagnosis not present

## 2020-03-11 DIAGNOSIS — R0789 Other chest pain: Secondary | ICD-10-CM | POA: Diagnosis not present

## 2020-03-11 DIAGNOSIS — E785 Hyperlipidemia, unspecified: Secondary | ICD-10-CM | POA: Diagnosis not present

## 2020-03-11 DIAGNOSIS — I10 Essential (primary) hypertension: Secondary | ICD-10-CM | POA: Diagnosis not present

## 2020-03-12 LAB — LIPID PANEL
Chol/HDL Ratio: 2.5 ratio (ref 0.0–4.4)
Cholesterol, Total: 130 mg/dL (ref 100–199)
HDL: 53 mg/dL (ref >39)
LDL Chol Calc (NIH): 55 mg/dL (ref 0–99)
Triglycerides: 127 mg/dL (ref 0–149)
VLDL Cholesterol Cal: 22 mg/dL (ref 5–40)

## 2020-03-12 LAB — HEPATIC FUNCTION PANEL
ALT: 19 IU/L (ref 0–32)
AST: 21 IU/L (ref 0–40)
Albumin: 4.3 g/dL (ref 3.7–4.7)
Alkaline Phosphatase: 76 IU/L (ref 44–121)
Bilirubin Total: 0.7 mg/dL (ref 0.0–1.2)
Bilirubin, Direct: 0.21 mg/dL (ref 0.00–0.40)
Total Protein: 6.9 g/dL (ref 6.0–8.5)

## 2020-03-15 NOTE — Progress Notes (Signed)
Cardiology Office Note:    Date:  03/17/2020   ID:  Brandy Cruz, DOB 04-06-1950, MRN [id number]  PCP:  Marylen Ponto, MD  Cardiologist:  Norman Herrlich, MD    Referring MD: Marylen Ponto, MD    ASSESSMENT:    1. Chest pain, unspecified type   2. Primary hypertension   3. Mixed hyperlipidemia    PLAN:    In order of problems listed above:  1. She is quite pleased with the results normal cardiac CTA score 0 no CAD coronary atherosclerosis her BP is at target continue her current antihypertensive and in view of the fact that she has hyperlipidemia and mild thoracic aortic sclerosis we will continue her statin but reduce frequency of 2 days/week.  I gave her a copy of her CTA report   Next appointment: As needed in the future   Medication Adjustments/Labs and Tests Ordered: Current medicines are reviewed at length with the patient today.  Concerns regarding medicines are outlined above.  No orders of the defined types were placed in this encounter.  Meds ordered this encounter  Medications  . atorvastatin (LIPITOR) 10 MG tablet    Sig: Take 1 tablet (10 mg total) by mouth 2 (two) times a week.    Dispense:  10 tablet    Refill:  3    Chief Complaint  Patient presents with  . Follow-up    History of Present Illness:    Brandy Cruz is a 70 y.o. female with a hx of hypertension, hyperlipidemia and chest pain with a previous normal stress test last seen 02/15/2020 with recurrent chest pain and referred for cardiac CTA.  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  Compliance with diet, lifestyle and medications: Yes  She has good healthcare literacy I reviewed her CTA report with her although her score is 0 and she has normal coronaries she has aortic atherosclerosis and we decided to stay on a statin and she will take 2 days a week.  She has had no muscle pain or weakness she has had no further chest pain but she has soreness in her left shoulder and thinks in  retrospect this was the etiology of her complaints.  BP is at target and tolerates her antihypertensive without lightheadedness  She underwent cardiac CTA reported 03/07/2020 with a coronary calcium score of 0 no evidence of CAD and mild calcification of the aortic root and descending thoracic aorta.  The over read by radiology showed no significant extracardiac findings. Past Medical History:  Diagnosis Date  . Anemia    hx of  . Anxiety    hx of  . Arthritis   . Atypical chest pain 02/12/2017  . Dyslipidemia 03/18/2017  . Electrocardiogram abnormal 02/13/2017  . Gastritis   . Hemorrhoids   . 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 Hyperlipidemia   . Hypertension   . OA (osteoarthritis) of knee 10/18/2014  . Osteoarthritis of right knee 10/18/2014  . Pain of left hip joint 06/11/2017    Past Surgical History:  Procedure Laterality Date  . APPENDECTOMY     2009  . CHOLECYSTECTOMY     2013  . 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 TOTAL KNEE ARTHROPLASTY Left 10/18/2014   Procedure: LEFT TOTAL KNEE ARTHROPLASTY;  Surgeon: Ollen Gross, MD;  Location: [facility];  Service: Orthopedics;  Laterality: Left;    Current Medications: Current Meds  Medication Sig  . allopurinol (ZYLOPRIM) 100 MG tablet Take 100 mg by mouth daily.  Marland Kitchen ALPRAZolam (XANAX) 0.25 MG tablet Take 0.25 mg by mouth 2 (two) times daily as needed for anxiety.  Marland Kitchen amLODipine (NORVASC) 2.5 MG tablet Take 2.5 mg by mouth at bedtime.  . colchicine 0.6 MG tablet Take 0.6 mg by mouth daily as needed (gout).  . pantoprazole (PROTONIX) 40 MG tablet Take 40 mg by mouth daily.  . sucralfate (CARAFATE) 1 g tablet Take 1 g by mouth 2 (two) times daily.   . valsartan-hydrochlorothiazide (DIOVAN-HCT) 320-12.5 MG per tablet Take 1 tablet by mouth every morning.  . venlafaxine  XR (EFFEXOR-XR) 37.5 MG 24 hr capsule Take 1 capsule by mouth daily.  . [DISCONTINUED] atorvastatin (LIPITOR) 10 MG tablet Take 1 tablet (10 mg total) by mouth daily.     Allergies:   Sulfa antibiotics, Levaquin [levofloxacin in d5w], and Penicillins   Social History   Socioeconomic History  . Marital status: Married    Spouse name: Not on file  . Number of children: Not on file  . Years of education: Not on file  . Highest education level: Not on file  Occupational History  . Not on file  Tobacco Use  . Smoking status: Never Smoker  . Smokeless tobacco: Never Used  Substance and Sexual Activity  . Alcohol use: Not on file    Comment: occasionally  . Drug use: No  . Sexual activity: Not on file  Other Topics Concern  . Not on file  Social History Narrative  . Not on file   Social Determinants of Health   Financial Resource Strain: Not on file  Food Insecurity: Not on file  Transportation Needs: Not on file  Physical Activity: Not on file  Stress: Not on file  Social Connections: Not on file     Family History: The patient's family history includes Arthritis in her mother; Prostate cancer in her father. ROS:   Please see the history of present illness.    All other systems reviewed and are negative.  EKGs/Labs/Other Studies Reviewed:    The following studies were reviewed today:   Recent Labs: 02/29/2020: BUN 9; Creatinine, Ser 0.64; Potassium 3.7; Sodium 137 03/11/2020: ALT 19  Recent Lipid Panel    Component Value Date/Time   CHOL 130 03/11/2020 0900   TRIG 127 03/11/2020 0900   HDL 53 03/11/2020 0900   CHOLHDL 2.5 03/11/2020 0900   LDLCALC 55 03/11/2020 0900    Physical Exam:    VS:  BP 110/70 (BP Location: Right Arm, Patient Position: Sitting, Cuff Size: Normal)   Pulse 66   Ht 5\' 4"  (1.626 m)   Wt 155 lb (70.3 kg)   SpO2 97%   BMI 26.61 kg/m     Wt Readings from Last 3 Encounters:  03/17/20 155 lb (70.3 kg)  02/15/20 155 lb (70.3 kg)   02/01/20 157 lb (71.2 kg)     GEN:  Well nourished, well developed in no acute distress HEENT: Normal NECK: No JVD; No carotid bruits LYMPHATICS: No lymphadenopathy CARDIAC: RRR, no murmurs, rubs, gallops RESPIRATORY:  Clear to auscultation without rales, wheezing or rhonchi  ABDOMEN: Soft, non-tender, non-distended MUSCULOSKELETAL:  No edema; No deformity  SKIN: Warm and dry NEUROLOGIC:  Alert and oriented x 3 PSYCHIATRIC:  Normal affect    Signed, 14/06/21, MD  03/17/2020 10:08 AM    [facility]

## 2020-03-17 ENCOUNTER — Other Ambulatory Visit: Payer: Self-pay

## 2020-03-17 ENCOUNTER — Encounter: Payer: Self-pay | Admitting: Cardiology

## 2020-03-17 ENCOUNTER — Ambulatory Visit: Payer: Medicare PPO | Admitting: Cardiology

## 2020-03-17 VITALS — BP 110/70 | HR 66 | Ht 64.0 in | Wt 155.0 lb

## 2020-03-17 DIAGNOSIS — E782 Mixed hyperlipidemia: Secondary | ICD-10-CM | POA: Diagnosis not present

## 2020-03-17 DIAGNOSIS — R079 Chest pain, unspecified: Secondary | ICD-10-CM | POA: Diagnosis not present

## 2020-03-17 DIAGNOSIS — I10 Essential (primary) hypertension: Secondary | ICD-10-CM | POA: Diagnosis not present

## 2020-03-17 DIAGNOSIS — D519 Vitamin B12 deficiency anemia, unspecified: Secondary | ICD-10-CM | POA: Diagnosis not present

## 2020-03-17 MED ORDER — ATORVASTATIN CALCIUM 10 MG PO TABS: 10.0000 mg | ORAL_TABLET | ORAL | 3 refills | Status: DC

## 2020-03-17 NOTE — Patient Instructions (Signed)
Medication Instructions:  Your physician has recommended you make the following change in your medication:  DECREASE: Atorvastatin to twice weekly.  *If you need a refill on your cardiac medications before your next appointment, please call your pharmacy*   Lab Work: None If you have labs (blood work) drawn today and your tests are completely normal, you will receive your results only by: Marland Kitchen MyChart Message (if you have MyChart) OR . A paper copy in the mail If you have any lab test that is abnormal or we need to change your treatment, we will call you to review the results.   Testing/Procedures: None   Follow-Up: At [facility], you and your health needs are our priority.  As part of our continuing mission to provide you with exceptional heart care, we have created designated Provider Care Teams.  These Care Teams include your primary Cardiologist (physician) and Advanced Practice Providers (APPs -  Physician Assistants and Nurse Practitioners) who all work together to provide you with the care you need, when you need it.  We recommend signing up for the patient portal called "MyChart".  Sign up information is provided on this After Visit Summary.  MyChart is used to connect with patients for Virtual Visits (Telemedicine).  Patients are able to view lab/test results, encounter notes, upcoming appointments, etc.  Non-urgent messages can be sent to your provider as well.   To learn more about what you can do with MyChart, go to [email or web address].    Your next appointment:   As needed  The format for your next appointment:   In Person  Provider:   Norman Herrlich, MD   Other Instructions

## 2020-04-19 DIAGNOSIS — D519 Vitamin B12 deficiency anemia, unspecified: Secondary | ICD-10-CM | POA: Diagnosis not present

## 2020-04-19 DIAGNOSIS — D1801 Hemangioma of skin and subcutaneous tissue: Secondary | ICD-10-CM | POA: Diagnosis not present

## 2020-04-19 DIAGNOSIS — L814 Other melanin hyperpigmentation: Secondary | ICD-10-CM | POA: Diagnosis not present

## 2020-04-19 DIAGNOSIS — D2239 Melanocytic nevi of other parts of face: Secondary | ICD-10-CM | POA: Diagnosis not present

## 2020-04-19 DIAGNOSIS — L820 Inflamed seborrheic keratosis: Secondary | ICD-10-CM | POA: Diagnosis not present

## 2020-04-19 DIAGNOSIS — D225 Melanocytic nevi of trunk: Secondary | ICD-10-CM | POA: Diagnosis not present

## 2020-04-21 DIAGNOSIS — Z6826 Body mass index (BMI) 26.0-26.9, adult: Secondary | ICD-10-CM | POA: Diagnosis not present

## 2020-04-21 DIAGNOSIS — K529 Noninfective gastroenteritis and colitis, unspecified: Secondary | ICD-10-CM | POA: Diagnosis not present

## 2020-04-21 DIAGNOSIS — R509 Fever, unspecified: Secondary | ICD-10-CM | POA: Diagnosis not present

## 2020-04-21 DIAGNOSIS — Z20828 Contact with and (suspected) exposure to other viral communicable diseases: Secondary | ICD-10-CM | POA: Diagnosis not present

## 2020-04-21 DIAGNOSIS — R300 Dysuria: Secondary | ICD-10-CM | POA: Diagnosis not present

## 2020-05-11 DIAGNOSIS — E785 Hyperlipidemia, unspecified: Secondary | ICD-10-CM | POA: Diagnosis not present

## 2020-05-11 DIAGNOSIS — M8580 Other specified disorders of bone density and structure, unspecified site: Secondary | ICD-10-CM | POA: Diagnosis not present

## 2020-05-11 DIAGNOSIS — E559 Vitamin D deficiency, unspecified: Secondary | ICD-10-CM | POA: Diagnosis not present

## 2020-05-11 DIAGNOSIS — Z79899 Other long term (current) drug therapy: Secondary | ICD-10-CM | POA: Diagnosis not present

## 2020-05-13 ENCOUNTER — Other Ambulatory Visit (INDEPENDENT_AMBULATORY_CARE_PROVIDER_SITE_OTHER): Payer: Medicare PPO

## 2020-05-13 ENCOUNTER — Encounter: Payer: Self-pay | Admitting: Nurse Practitioner

## 2020-05-13 ENCOUNTER — Other Ambulatory Visit: Payer: Self-pay

## 2020-05-13 ENCOUNTER — Ambulatory Visit: Payer: Medicare PPO | Admitting: Nurse Practitioner

## 2020-05-13 ENCOUNTER — Telehealth: Payer: Self-pay | Admitting: *Deleted

## 2020-05-13 VITALS — BP 120/70 | HR 75 | Ht 64.0 in | Wt 153.0 lb

## 2020-05-13 DIAGNOSIS — Z1211 Encounter for screening for malignant neoplasm of colon: Secondary | ICD-10-CM

## 2020-05-13 DIAGNOSIS — R1032 Left lower quadrant pain: Secondary | ICD-10-CM

## 2020-05-13 LAB — BASIC METABOLIC PANEL
BUN: 9 mg/dL (ref 6–23)
CO2: 32 mEq/L (ref 19–32)
Calcium: 9.5 mg/dL (ref 8.4–10.5)
Chloride: 96 mEq/L (ref 96–112)
Creatinine, Ser: 0.66 mg/dL (ref 0.40–1.20)
GFR: 87.83 mL/min (ref >60.00)
Glucose, Bld: 111 mg/dL — ABNORMAL HIGH (ref 70–99)
Potassium: 3.3 mEq/L — ABNORMAL LOW (ref 3.5–5.1)
Sodium: 137 mEq/L (ref 135–145)

## 2020-05-13 MED ORDER — METRONIDAZOLE 500 MG PO TABS: 500.0000 mg | ORAL_TABLET | Freq: Three times a day (TID) | ORAL | 0 refills | Status: AC

## 2020-05-13 MED ORDER — DOXYCYCLINE HYCLATE 100 MG PO TBEC: 100.0000 mg | DELAYED_RELEASE_TABLET | Freq: Two times a day (BID) | ORAL | 0 refills | Status: AC

## 2020-05-13 NOTE — Telephone Encounter (Signed)
Scripts sent

## 2020-05-13 NOTE — Telephone Encounter (Signed)
-----   Message from Meredith Pel, NP sent at 05/13/2020  3:52 PM EDT ----- Herbert Seta, please send a prescription for doxycycline 100 mg twice daily for 7 days and Flagyl 500 mg 3 times daily for 7 days.  Thanks

## 2020-05-13 NOTE — Progress Notes (Signed)
ASSESSMENT AND PLAN    # 37 female with lower abdominal pain concerning for diverticulitis.  --BMET to check renal function then CT scan abd / pelvis.  --CT scan scheduled for Tuesday.  We will send in antibiotics that she can have over the weekend should pain get worse or she develop fever .  Patient is allergic to penicillin, Levaquin and sulfa .  Therefore will use doxycycline and Flagyl, both for 7 days   # Colon cancer screening. Last colonoscopy with Dr. Chales Abrahams was 10 years ago --We will hold off on scheduling until further evaluation and treatment of possible diverticulitis  # Chronic, occasional postprandial epigastric discomfort.  She has been BID carafate for years.  --Stop scheduled Carafate, take as needed.  --Doesn't take NSAIDs.        Chief Complaint : abdominal pain  GLORIANNA GOTT is a 70 y.o. female with a history of gout, hypertension, hyperlipidemia, osteoarthritis, cholecystectomy, hysterectomy, appendectomy.  She known to Dr. Chales Abrahams from a colonoscopy 10 years ago.   Patient has had intermittent diarrhea since her cholecystectomy in 2013. Bowel movements otherwise soft.  Her bowel habits are unchanged.  She is here because of abdominal pain  Over the last couple of weeks patient has been having achy / burning pain in lower abdomen.  The pain occurs several times a day and is a 4 on pain scale of 10. She cannot relate the pain to eating and it isn't relieved with defecation. She had not been exercising or doing any heavy lifting prior to onset of pain. Tylenol helps. Evaluated by PCP and says her urinalysis was normal .  She has no dysuria. She has a history of UTIs but this pain is different.  No significant nausea. No vomiting. No fever. No blood in stool.   She gives a history of gastritis.  Sometimes she has postprandial epigastric discomfort.  She has taken twice daily Carafate for years and inquires if it is necessary to continue it   PREVIOUS EVALUATIONS:    Patient says her last colonoscopy was 10 years ago with Dr. Chales Abrahams in [place]  Past Medical History:  Diagnosis Date  . Anemia    hx of  . Anxiety    hx of  . Arthritis   . Depression   . Dyslipidemia 03/18/2017  . Electrocardiogram abnormal 02/13/2017  . Gastritis   . Hemorrhoids   . 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 Hyperlipidemia   . Hypertension   . OA (osteoarthritis) of knee 10/18/2014  . Osteoarthritis of right knee 10/18/2014     Past Surgical History:  Procedure Laterality Date  . ABDOMINAL HYSTERECTOMY    . APPENDECTOMY     2009  . CHOLECYSTECTOMY     2013  . COLONOSCOPY  07/19/2010   Mild sigmoid diverticulosis. Internal hemorrhoids. Otherwise normal colonoscopy to terminal ileum  . DILATION AND CURETTAGE OF UTERUS    . ESOPHAGOGASTRODUODENOSCOPY  2001  . 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 TOTAL KNEE ARTHROPLASTY Left 10/18/2014   Procedure: LEFT TOTAL KNEE ARTHROPLASTY;  Surgeon: Ollen Gross, MD;  Location: [facility];  Service: Orthopedics;  Laterality: Left;   Family History  Problem Relation Age of Onset  . Arthritis Mother   . Prostate cancer Father   . Colon cancer Neg Hx   . Stomach  cancer Neg Hx   . Pancreatic cancer Neg Hx   . Esophageal cancer Neg Hx   . Liver disease Neg Hx    Social History   Tobacco Use  . Smoking status: Never Smoker  . Smokeless tobacco: Never Used  Vaping Use  . Vaping Use: Never used  Substance Use Topics  . Alcohol use: Yes    Comment: occasionally  . Drug use: No   Current Outpatient Medications  Medication Sig Dispense Refill  . allopurinol (ZYLOPRIM) 100 MG tablet Take 100 mg by mouth daily.    Marland Kitchen ALPRAZolam (XANAX) 0.25 MG tablet Take 0.25 mg by mouth 2 (two) times daily as needed for anxiety.    Marland Kitchen amLODipine (NORVASC) 2.5 MG tablet Take  2.5 mg by mouth at bedtime.    Marland Kitchen atorvastatin (LIPITOR) 10 MG tablet Take 1 tablet (10 mg total) by mouth 2 (two) times a week. 10 tablet 3  . colchicine 0.6 MG tablet Take 0.6 mg by mouth daily as needed (gout).    . sucralfate (CARAFATE) 1 g tablet Take 1 g by mouth 2 (two) times daily.     . valsartan-hydrochlorothiazide (DIOVAN-HCT) 320-12.5 MG per tablet Take 1 tablet by mouth every morning.  12  . venlafaxine XR (EFFEXOR-XR) 37.5 MG 24 hr capsule Take 1 capsule by mouth daily.     No current facility-administered medications for this visit.   Allergies  Allergen Reactions  . Sulfa Antibiotics Hives and Itching    + fever  . Levaquin [Levofloxacin In D5w]     Dizziness and headache  . Penicillins Rash    Childhood allergy.      Review of Systems: Positive for arthritis, night sweats . All other systems reviewed and negative except where noted in HPI.   PHYSICAL EXAM :    Wt Readings from Last 3 Encounters:  05/13/20 153 lb (69.4 kg)  03/17/20 155 lb (70.3 kg)  02/15/20 155 lb (70.3 kg)    BP 120/70   Pulse 75   Ht 5\' 4"  (1.626 m)   Wt 153 lb (69.4 kg)   SpO2 97%   BMI 26.26 kg/m  Constitutional:  Pleasant female in no acute distress. Psychiatric: Normal mood and affect. Behavior is normal. EENT: Pupils normal.  Conjunctivae are normal. No scleral icterus. Neck supple.  Cardiovascular: Normal rate, regular rhythm. No edema Pulmonary/chest: Effort normal and breath sounds normal. No wheezing, rales or rhonchi. Abdominal: Soft, nondistended, mild LLQ and mild lower abdominal tenderness, no hepatomegaly. Neurological: Alert and oriented to person place and time. Skin: Skin is warm and dry. No rashes noted.  , NP  05/13/2020, 10:22 AM

## 2020-05-13 NOTE — Patient Instructions (Signed)
If you are age 72 or older, your body mass index should be between 23-30. Your Body mass index is 26.26 kg/m. If this is out of the aforementioned range listed, please consider follow up with your Primary Care Provider.  If you are age 17 or younger, your body mass index should be between 19-25. Your Body mass index is 26.26 kg/m. If this is out of the aformentioned range listed, please consider follow up with your Primary Care Provider.   Your provider has requested that you go to the basement level for lab work before leaving today. Press "B" on the elevator. The lab is located at the first door on the left as you exit the elevator.  You have been scheduled for a CT scan of the abdomen and pelvis at [place] ([address]---this is in the same building as [employer]).   You are scheduled on Tuesday 05/17/20 at 2:30 pm. You should arrive 15 minutes prior to your appointment time for registration. Please follow the written instructions below on the day of your exam:  WARNING: IF YOU ARE ALLERGIC TO IODINE/X-RAY DYE, PLEASE NOTIFY RADIOLOGY IMMEDIATELY AT [phone]! YOU WILL BE GIVEN A 13 HOUR PREMEDICATION PREP.  1) Do not eat or drink anything after 10:30 am (4 hours prior to your test) 2) You have been given 2 bottles of oral contrast to drink. The solution may taste better if refrigerated, but do NOT add ice or any other liquid to this solution. Shake well before drinking.    Drink 1 bottle of contrast @ 12:30 pm (2 hours prior to your exam)  Drink 1 bottle of contrast @ 1:30 pm (1 hour prior to your exam)  You may take any medications as prescribed with a small amount of water, if necessary. If you take any of the following medications: METFORMIN, GLUCOPHAGE, GLUCOVANCE, AVANDAMET, RIOMET, FORTAMET, [place] MET, JANUMET, GLUMETZA or METAGLIP, you MAY be asked to HOLD this medication 48 hours AFTER the exam.  The purpose of you drinking the oral contrast is  to aid in the visualization of your intestinal tract. The contrast solution may cause some diarrhea. Depending on your individual set of symptoms, you may also receive an intravenous injection of x-ray contrast/dye. Plan on being at [facility] for 30 minutes or longer, depending on the type of exam you are having performed.  This test typically takes 30-45 minutes to complete.  If you have any questions regarding your exam or if you need to reschedule, you may call the CT department at [phone] between the hours of 8:00 am and 5:00 pm, Monday-Friday.  ______________________________________________________________

## 2020-05-17 ENCOUNTER — Other Ambulatory Visit: Payer: Self-pay

## 2020-05-17 ENCOUNTER — Ambulatory Visit (INDEPENDENT_AMBULATORY_CARE_PROVIDER_SITE_OTHER)
Admission: RE | Admit: 2020-05-17 | Discharge: 2020-05-17 | Disposition: A | Discharge status: Home / Self Care | Payer: Medicare PPO | Source: Ambulatory Visit | Attending: Nurse Practitioner | Admitting: Nurse Practitioner

## 2020-05-17 DIAGNOSIS — R1032 Left lower quadrant pain: Secondary | ICD-10-CM

## 2020-05-17 DIAGNOSIS — R109 Unspecified abdominal pain: Secondary | ICD-10-CM | POA: Diagnosis not present

## 2020-05-17 MED ORDER — IOHEXOL 300 MG/ML  SOLN
100.0000 mL | Freq: Once | INTRAMUSCULAR | Status: AC | PRN
  Administered 2020-05-17: 100 mL via INTRAVENOUS

## 2020-05-18 DIAGNOSIS — M8580 Other specified disorders of bone density and structure, unspecified site: Secondary | ICD-10-CM | POA: Diagnosis not present

## 2020-05-18 DIAGNOSIS — I77819 Aortic ectasia, unspecified site: Secondary | ICD-10-CM | POA: Diagnosis not present

## 2020-05-18 DIAGNOSIS — E785 Hyperlipidemia, unspecified: Secondary | ICD-10-CM | POA: Diagnosis not present

## 2020-05-18 DIAGNOSIS — Z6825 Body mass index (BMI) 25.0-25.9, adult: Secondary | ICD-10-CM | POA: Diagnosis not present

## 2020-05-18 DIAGNOSIS — Z1331 Encounter for screening for depression: Secondary | ICD-10-CM | POA: Diagnosis not present

## 2020-05-18 DIAGNOSIS — I10 Essential (primary) hypertension: Secondary | ICD-10-CM | POA: Diagnosis not present

## 2020-05-18 DIAGNOSIS — Z0000 Encounter for general adult medical examination without abnormal findings: Secondary | ICD-10-CM | POA: Diagnosis not present

## 2020-05-19 DIAGNOSIS — E538 Deficiency of other specified B group vitamins: Secondary | ICD-10-CM | POA: Diagnosis not present

## 2020-05-23 DIAGNOSIS — Z1231 Encounter for screening mammogram for malignant neoplasm of breast: Secondary | ICD-10-CM | POA: Diagnosis not present

## 2020-05-23 DIAGNOSIS — Z6826 Body mass index (BMI) 26.0-26.9, adult: Secondary | ICD-10-CM | POA: Diagnosis not present

## 2020-05-23 DIAGNOSIS — Z01419 Encounter for gynecological examination (general) (routine) without abnormal findings: Secondary | ICD-10-CM | POA: Diagnosis not present

## 2020-05-26 NOTE — Telephone Encounter (Signed)
Pt is requesting a call back from a nurse, pt states she has finished her doxycyline and the pain has returned.

## 2020-05-26 NOTE — Telephone Encounter (Signed)
Left message on machine to call back  

## 2020-05-31 NOTE — Telephone Encounter (Signed)
Several messages left for pt to return call if she continues to have symptoms.

## 2020-06-03 ENCOUNTER — Ambulatory Visit: Payer: Medicare PPO | Admitting: Gastroenterology

## 2020-06-07 ENCOUNTER — Ambulatory Visit (AMBULATORY_SURGERY_CENTER): Payer: Self-pay

## 2020-06-07 ENCOUNTER — Other Ambulatory Visit: Payer: Self-pay

## 2020-06-07 VITALS — Ht 64.0 in | Wt 150.6 lb

## 2020-06-07 DIAGNOSIS — R1032 Left lower quadrant pain: Secondary | ICD-10-CM

## 2020-06-07 DIAGNOSIS — K5732 Diverticulitis of large intestine without perforation or abscess without bleeding: Secondary | ICD-10-CM

## 2020-06-07 MED ORDER — NA SULFATE-K SULFATE-MG SULF 17.5-3.13-1.6 GM/177ML PO SOLN: 1.0000 | Freq: Once | ORAL | 0 refills | Status: AC

## 2020-06-07 NOTE — Progress Notes (Signed)

## 2020-06-08 ENCOUNTER — Encounter: Payer: Self-pay | Admitting: Gastroenterology

## 2020-06-15 ENCOUNTER — Encounter: Payer: Self-pay | Admitting: Certified Registered Nurse Anesthetist

## 2020-06-16 ENCOUNTER — Ambulatory Visit (AMBULATORY_SURGERY_CENTER): Payer: Medicare PPO | Admitting: Gastroenterology

## 2020-06-16 ENCOUNTER — Encounter: Payer: Self-pay | Admitting: Gastroenterology

## 2020-06-16 ENCOUNTER — Other Ambulatory Visit: Payer: Self-pay

## 2020-06-16 VITALS — BP 111/58 | HR 55 | Temp 97.4°F | Resp 13 | Ht 64.0 in | Wt 150.0 lb

## 2020-06-16 DIAGNOSIS — K5730 Diverticulosis of large intestine without perforation or abscess without bleeding: Secondary | ICD-10-CM | POA: Diagnosis not present

## 2020-06-16 DIAGNOSIS — E7800 Pure hypercholesterolemia, unspecified: Secondary | ICD-10-CM | POA: Diagnosis not present

## 2020-06-16 DIAGNOSIS — R1032 Left lower quadrant pain: Secondary | ICD-10-CM

## 2020-06-16 DIAGNOSIS — Z1211 Encounter for screening for malignant neoplasm of colon: Secondary | ICD-10-CM | POA: Diagnosis not present

## 2020-06-16 DIAGNOSIS — I10 Essential (primary) hypertension: Secondary | ICD-10-CM | POA: Diagnosis not present

## 2020-06-16 MED ORDER — SODIUM CHLORIDE 0.9 % IV SOLN: 500.0000 mL | Freq: Once | INTRAVENOUS | Status: DC

## 2020-06-16 NOTE — Patient Instructions (Addendum)
Start high fiber diet Take benefiber 1 tsp daily I tea or coffee Continue current medications  YOU HAD AN ENDOSCOPIC PROCEDURE TODAY AT THE [facility]:   Refer to the procedure report that was given to you for any specific questions about what was found during the examination.  If the procedure report does not answer your questions, please call your gastroenterologist to clarify.  If you requested that your care partner not be given the details of your procedure findings, then the procedure report has been included in a sealed envelope for you to review at your convenience later.  YOU SHOULD EXPECT: Some feelings of bloating in the abdomen. Passage of more gas than usual.  Walking can help get rid of the air that was put into your GI tract during the procedure and reduce the bloating. If you had a lower endoscopy (such as a colonoscopy or flexible sigmoidoscopy) you may notice spotting of blood in your stool or on the toilet paper. If you underwent a bowel prep for your procedure, you may not have a normal bowel movement for a few days.  Please Note:  You might notice some irritation and congestion in your nose or some drainage.  This is from the oxygen used during your procedure.  There is no need for concern and it should clear up in a day or so.  SYMPTOMS TO REPORT IMMEDIATELY:   Following lower endoscopy (colonoscopy or flexible sigmoidoscopy):  Excessive amounts of blood in the stool  Significant tenderness or worsening of abdominal pains  Swelling of the abdomen that is new, acute  Fever of 100F or higher Start taking benefiber one tsp daily in tea or coffee   For urgent or emergent issues, a gastroenterologist can be reached at any hour by calling [phone]. Do not use MyChart messaging for urgent concerns.   DIET:  We do recommend a small meal at first, but then you may proceed to your regular diet.  Drink plenty of fluids but you should avoid alcoholic beverages  for 24 hours.  ACTIVITY:  You should plan to take it easy for the rest of today and you should NOT DRIVE or use heavy machinery until tomorrow (because of the sedation medicines used during the test).    FOLLOW UP: Our staff will call the number listed on your records 48-72 hours following your procedure to check on you and address any questions or concerns that you may have regarding the information given to you following your procedure. If we do not reach you, we will leave a message.  We will attempt to reach you two times.  During this call, we will ask if you have developed any symptoms of COVID 19. If you develop any symptoms (ie: fever, flu-like symptoms, shortness of breath, cough etc.) before then, please call [phone].  If you test positive for Covid 19 in the 2 weeks post procedure, please call and report this information to [place].    If any biopsies were taken you will be contacted by phone or by letter within the next 1-3 weeks.  Please call us at [phone] if you have not heard about the biopsies in 3 weeks.   SIGNATURES/CONFIDENTIALITY: You and/or your care partner have signed paperwork which will be entered into your electronic medical record.  These signatures attest to the fact that that the information above on your After Visit Summary has been reviewed and is understood.  Full responsibility of the confidentiality of this discharge  information lies with you and/or your care-partner. 

## 2020-06-16 NOTE — Progress Notes (Signed)
Report given to PACU, vss 

## 2020-06-16 NOTE — Op Note (Signed)
[facility] Patient Name: Brandy Cruz Procedure Date: 06/16/2020 10:09 AM MRN: [id number] Endoscopist: Lynann Bologna , MD Age: 70 Referring MD:  Date of Birth: 1950/03/20 Gender: Female Account #: [id number] Procedure:                Colonoscopy Indications:              Screening for colorectal malignant neoplasm.                            History of diverticulitis. Medicines:                Monitored Anesthesia Care Procedure:                Pre-Anesthesia Assessment:                           - Prior to the procedure, a History and Physical                            was performed, and patient medications and                            allergies were reviewed. The patient's tolerance of                            previous anesthesia was also reviewed. The risks                            and benefits of the procedure and the sedation                            options and risks were discussed with the patient.                            All questions were answered, and informed consent                            was obtained. Prior Anticoagulants: The patient has                            taken no previous anticoagulant or antiplatelet                            agents. ASA Grade Assessment: II - A patient with                            mild systemic disease. After reviewing the risks                            and benefits, the patient was deemed in                            satisfactory condition to undergo the procedure.  After obtaining informed consent, the colonoscope                            was passed under direct vision. Throughout the                            procedure, the patient's blood pressure, pulse, and                            oxygen saturations were monitored continuously. The                            Olympus PCF-H190DL (#[id number]) Colonoscope was                            introduced through the anus and advanced to  the 2                            cm into the ileum. The colonoscopy was performed                            without difficulty. The patient tolerated the                            procedure well. The quality of the bowel                            preparation was good. The terminal ileum, ileocecal                            valve, appendiceal orifice, and rectum were                            photographed. Scope In: 10:19:54 AM Scope Out: 10:34:52 AM Scope Withdrawal Time: 0 hours 9 minutes 8 seconds  Total Procedure Duration: 0 hours 14 minutes 58 seconds  Findings:                 Multiple medium-mouthed diverticula were found in                            the sigmoid colon.                           Non-bleeding internal hemorrhoids were found during                            retroflexion. The hemorrhoids were moderate. Minor                            degree of rectal prolapse.                           The terminal ileum appeared normal.  The exam was otherwise without abnormality on                            direct and retroflexion views. Complications:            No immediate complications. Estimated Blood Loss:     Estimated blood loss: none. Impression:               - Moderate sigmoid diverticulosis.                           - Non-bleeding internal hemorrhoids.                           - The examined portion of the ileum was normal.                           - The examination was otherwise normal on direct                            and retroflexion views.                           - No specimens collected. Recommendation:           - Patient has a contact number available for                            emergencies. The signs and symptoms of potential                            delayed complications were discussed with the                            patient. Return to normal activities tomorrow.                            Written discharge  instructions were provided to the                            patient.                           - High fiber diet. Can Use fiber supplements.                           - Continue present medications.                           - Repeat colonoscopy is not recommended for                            screening purposes. Hence, colonoscopy only if with                            any new problems.                           -  The findings and recommendations were discussed                            with the patient's Husband Rosanne AshingJim. Lynann Bolognaajesh Smt Lokey, MD 06/16/2020 10:38:27 AM This report has been signed electronically.

## 2020-06-17 DIAGNOSIS — E538 Deficiency of other specified B group vitamins: Secondary | ICD-10-CM | POA: Diagnosis not present

## 2020-06-20 ENCOUNTER — Telehealth: Payer: Self-pay

## 2020-06-20 NOTE — Telephone Encounter (Signed)
  Follow up Call-  Call back number 06/16/2020  Post procedure Call Back phone  # [phone]  Permission to leave phone message Yes  Some recent data might be hidden     Patient questions:  Do you have a fever, pain , or abdominal swelling? No. Pain Score  0 *  Have you tolerated food without any problems? Yes.    Have you been able to return to your normal activities? Yes.    Do you have any questions about your discharge instructions: Diet   No. Medications  No. Follow up visit  No.  Do you have questions or concerns about your Care? No.  Actions: * If pain score is 4 or above: No action needed, pain <4.  Have you developed a fever since your procedure? No 2.   Have you had an respiratory symptoms (SOB or cough) since your procedure? No 3.   Have you tested positive for COVID 19 since your procedure No 4.   Have you had any family members/close contacts diagnosed with the COVID 19 since your procedure?  No  If yes to any of these questions please route to Laverna Peace, RN and Karlton Lemon, RN

## 2020-06-29 DIAGNOSIS — R059 Cough, unspecified: Secondary | ICD-10-CM | POA: Diagnosis not present

## 2020-07-14 ENCOUNTER — Telehealth: Payer: Self-pay | Admitting: *Deleted

## 2020-07-14 DIAGNOSIS — Z006 Encounter for examination for normal comparison and control in clinical research program: Secondary | ICD-10-CM

## 2020-07-14 NOTE — Telephone Encounter (Signed)
I called patient for 90-day Identify study phone call. Patient is feeling well with no cardiac symptoms. I reminded patient I would call her in January for 1 year follow-up.

## 2020-07-31 ENCOUNTER — Other Ambulatory Visit: Payer: Self-pay | Admitting: Cardiology

## 2020-08-15 DIAGNOSIS — E538 Deficiency of other specified B group vitamins: Secondary | ICD-10-CM | POA: Diagnosis not present

## 2020-08-25 DIAGNOSIS — L820 Inflamed seborrheic keratosis: Secondary | ICD-10-CM | POA: Diagnosis not present

## 2020-09-09 DIAGNOSIS — E538 Deficiency of other specified B group vitamins: Secondary | ICD-10-CM | POA: Diagnosis not present

## 2020-09-29 DIAGNOSIS — R300 Dysuria: Secondary | ICD-10-CM | POA: Diagnosis not present

## 2020-09-29 DIAGNOSIS — Z6826 Body mass index (BMI) 26.0-26.9, adult: Secondary | ICD-10-CM | POA: Diagnosis not present

## 2020-09-30 DIAGNOSIS — R300 Dysuria: Secondary | ICD-10-CM | POA: Diagnosis not present

## 2020-10-11 ENCOUNTER — Other Ambulatory Visit: Payer: Self-pay

## 2020-10-11 ENCOUNTER — Ambulatory Visit (INDEPENDENT_AMBULATORY_CARE_PROVIDER_SITE_OTHER): Payer: Medicare PPO | Admitting: Gastroenterology

## 2020-10-11 ENCOUNTER — Encounter: Payer: Self-pay | Admitting: Gastroenterology

## 2020-10-11 VITALS — BP 128/78 | HR 67 | Ht 64.0 in | Wt 155.2 lb

## 2020-10-11 DIAGNOSIS — R1013 Epigastric pain: Secondary | ICD-10-CM

## 2020-10-11 NOTE — Progress Notes (Signed)
Chief Complaint: epi pain  Referring Provider:  Marylen Ponto, MD      ASSESSMENT AND PLAN;   #1. Epi pain. S/P lap chole in past.   #2. GERD  Plan: -Blood tests from Dr Leonor Liv. -EGD for further evaluation. -Continue Protonix on as needed basis for now. -Would continue Carafate for now.  Proceed with EGD. I have discussed the risks and benefits. The risks including rare risk of perforation, bleeding, missed UGI neoplasms, risks of anesthesia/sedation. Alternatives were given. Patient is aware and agrees to proceed. All the questions were answered. This will be scheduled in upcoming days. Consent forms were given for review.  HPI:    Brandy Cruz is a 70 y.o. female   C/O epi pain x 4-5 weeks, mostly after eating.  Getting somewhat worse.  She has been on Carafate for several years.  She now takes it on as-needed basis.  She also takes Protonix on as needed basis.  She denies having any odynophagia or dysphagia.  No definite food intolerance except milk which causes her to have more bloating.  She denies having any fever chills or night sweats no recent melena or hematochezia.  Rare nausea but no vomiting.  She continues to be on high-fiber diet.  No further episodes of acute diverticulitis.  S/P chole in past.  She was seen by Dr. Leonor Liv.  We do not have any records currently.  She apparently had normal labs.  She was sent to GI clinic for further evaluation.   Past GI procedures: Colonoscopy 05/2020 - Moderate sigmoid diverticulosis. - Non-bleeding internal hemorrhoids. - The examined portion of the ileum was normal. - The examination was otherwise normal on direct and retroflexion views. - No need to repeat unless problems.  CT AP 04/2020 1. Findings suggestive of acute sigmoid diverticulitis. Given the slightly asymmetric appearance of colonic wall thickening at this location, underlying colonic malignancy is not excluded. Recommend colonoscopy following resolution  of patient's acute symptoms. 2. Circumferential rectal wall thickening, which may represent proctitis. 3. Aortic atherosclerosis (ICD10-I70.0).     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 Internal hemorrhoids. Otherwise normal colonoscopy to terminal ileum   DILATION AND CURETTAGE OF UTERUS     ESOPHAGOGASTRODUODENOSCOPY  2001   TOTAL HIP ARTHROPLASTY Left 03/05/2018   Procedure: TOTAL HIP ARTHROPLASTY ANTERIOR APPROACH;  Surgeon: Ollen Gross, MD;  Location: [facility];  Service: Orthopedics;  Laterality: Left;   TOTAL KNEE ARTHROPLASTY Left 10/18/2014   Procedure: LEFT TOTAL KNEE ARTHROPLASTY;  Surgeon: Ollen Gross, MD;  Location: [facility];  Service: Orthopedics;  Laterality: Left;    Family History  Problem Relation Age of Onset   Arthritis Mother  Prostate cancer Father    Colon cancer Neg Hx    Stomach cancer Neg Hx    Pancreatic cancer Neg Hx    Esophageal cancer Neg Hx    Liver disease Neg Hx     Social History   Tobacco Use   Smoking status: Never   Smokeless tobacco: Never  Vaping Use   Vaping Use: Never used  Substance Use Topics   Alcohol use: Yes    Comment: occasionally   Drug use: No    Current Outpatient Medications  Medication Sig Dispense Refill   allopurinol (ZYLOPRIM) 100 MG tablet Take 100 mg by mouth  daily.     ALPRAZolam (XANAX) 0.25 MG tablet Take 0.25 mg by mouth 2 (two) times daily as needed for anxiety.     amLODipine (NORVASC) 2.5 MG tablet Take 2.5 mg by mouth at bedtime.     atorvastatin (LIPITOR) 10 MG tablet Take 1 tablet (10 mg total) by mouth 2 (two) times a week. 10 tablet 3   colchicine 0.6 MG tablet Take 0.6 mg by mouth daily as needed (gout).     pantoprazole (PROTONIX) 40 MG tablet Take 40 mg by mouth daily as needed.     sucralfate (CARAFATE) 1 g tablet Take 1 g by mouth 2 (two) times daily.      valsartan-hydrochlorothiazide (DIOVAN-HCT) 320-12.5 MG per tablet Take 1 tablet by mouth every morning.  12   venlafaxine XR (EFFEXOR-XR) 37.5 MG 24 hr capsule Take 1 capsule by mouth daily.     No current facility-administered medications for this visit.    Allergies  Allergen Reactions   Sulfa Antibiotics Hives and Itching    + fever   Levaquin [Levofloxacin In D5w]     Dizziness and headache   Penicillins Rash    Childhood allergy.     Review of Systems:  Constitutional: Denies fever, chills, diaphoresis, appetite change and fatigue.  HEENT: Denies photophobia, eye pain, redness, hearing loss, ear pain, congestion, sore throat, rhinorrhea, sneezing, mouth sores, neck pain, neck stiffness and tinnitus.   Respiratory: Denies SOB, DOE, cough, chest tightness,  and wheezing.   Cardiovascular: Denies chest pain, palpitations and leg swelling.  Genitourinary: Denies dysuria, urgency, frequency, hematuria, flank pain and difficulty urinating.  Musculoskeletal: Denies myalgias, back pain, joint swelling, arthralgias and gait problem.  Skin: No rash.  Neurological: Denies dizziness, seizures, syncope, weakness, light-headedness, numbness and headaches.  Hematological: Denies adenopathy. Easy bruising, personal or family bleeding history  Psychiatric/Behavioral: No anxiety or depression     Physical Exam:    BP 128/78   Pulse 67   Ht 5\' 4"  (1.626 m)   Wt 155 lb 4 oz  (70.4 kg)   SpO2 97%   BMI 26.65 kg/m  Wt Readings from Last 3 Encounters:  10/11/20 155 lb 4 oz (70.4 kg)  06/16/20 150 lb (68 kg)  06/07/20 150 lb 9.6 oz (68.3 kg)   Constitutional:  Well-developed, in no acute distress. Psychiatric: Normal mood and affect. Behavior is normal. HEENT: Pupils normal.  Conjunctivae are normal. No scleral icterus. Neck supple.  Cardiovascular: Normal rate, regular rhythm. No edema Pulmonary/chest: Effort normal and breath sounds normal. No wheezing, rales or rhonchi. Abdominal: Soft, nondistended. Nontender. Bowel sounds active throughout. There are no masses palpable. No hepatomegaly. Rectal: Deferred Neurological: Alert and oriented to person place and time. Skin: Skin is warm and dry. No rashes noted.  Data Reviewed: I have personally reviewed following labs and imaging studies  CBC: CBC  Latest Ref Rng & Units 03/06/2018 02/24/2018 10/20/2014  WBC 4.0 - 10.5 K/uL 13.1(H) 7.3 14.1(H)  Hemoglobin 12.0 - 15.0 g/dL 11.8(L) 13.4 10.0(L)  Hematocrit 36.0 - 46.0 % 34.9(L) 39.7 29.2(L)  Platelets 150 - 400 K/uL 272 330 233    CMP: CMP Latest Ref Rng & Units 05/13/2020 03/11/2020 02/29/2020  Glucose 70 - 99 mg/dL 191(Y) - 94  BUN 6 - 23 mg/dL 9 - 9  Creatinine 7.82 - 1.20 mg/dL 9.56 - 2.13  Sodium 086 - 145 mEq/L 137 - 137  Potassium 3.5 - 5.1 mEq/L 3.3(L) - 3.7  Chloride 96 - 112 mEq/L 96 - 96  CO2 19 - 32 mEq/L 32 - 27  Calcium 8.4 - 10.5 mg/dL 9.5 - 9.3  Total Protein 6.0 - 8.5 g/dL - 6.9 -  Total Bilirubin 0.0 - 1.2 mg/dL - 0.7 -  Alkaline Phos 44 - 121 IU/L - 76 -  AST 0 - 40 IU/L - 21 -  ALT 0 - 32 IU/L - 19 -       Edman Circle, MD 10/11/2020, 9:45 AM  Cc: Marylen Ponto, MD

## 2020-10-11 NOTE — Patient Instructions (Signed)
If you are age 72 or older, your body mass index should be between 23-30. Your Body mass index is 26.65 kg/m. If this is out of the aforementioned range listed, please consider follow up with your Primary Care Provider.  If you are age 46 or younger, your body mass index should be between 19-25. Your Body mass index is 26.65 kg/m. If this is out of the aformentioned range listed, please consider follow up with your Primary Care Provider.   __________________________________________________________  The [facility] providers would like to encourage you to use [facility] to communicate with providers for non-urgent requests or questions.  Due to long hold times on the telephone, sending your provider a message by [facility] may be a faster and more efficient way to get a response.  Please allow 48 business hours for a response.  Please remember that this is for non-urgent requests.   You have been scheduled for an endoscopy. Please follow written instructions given to you at your visit today. If you use inhalers (even only as needed), please bring them with you on the day of your procedure.  Please call with any questions or concerns.  Thank you,  Dr. Lynann Bologna

## 2020-10-14 DIAGNOSIS — D519 Vitamin B12 deficiency anemia, unspecified: Secondary | ICD-10-CM | POA: Diagnosis not present

## 2020-10-21 ENCOUNTER — Ambulatory Visit (AMBULATORY_SURGERY_CENTER): Payer: Medicare PPO | Admitting: Gastroenterology

## 2020-10-21 ENCOUNTER — Encounter: Payer: Self-pay | Admitting: Gastroenterology

## 2020-10-21 ENCOUNTER — Other Ambulatory Visit: Payer: Self-pay

## 2020-10-21 VITALS — BP 136/75 | HR 68 | Temp 96.6°F | Resp 21 | Ht 64.0 in | Wt 155.0 lb

## 2020-10-21 DIAGNOSIS — K2900 Acute gastritis without bleeding: Secondary | ICD-10-CM

## 2020-10-21 DIAGNOSIS — K449 Diaphragmatic hernia without obstruction or gangrene: Secondary | ICD-10-CM | POA: Diagnosis not present

## 2020-10-21 DIAGNOSIS — K219 Gastro-esophageal reflux disease without esophagitis: Secondary | ICD-10-CM | POA: Diagnosis not present

## 2020-10-21 DIAGNOSIS — K2950 Unspecified chronic gastritis without bleeding: Secondary | ICD-10-CM | POA: Diagnosis not present

## 2020-10-21 DIAGNOSIS — K317 Polyp of stomach and duodenum: Secondary | ICD-10-CM

## 2020-10-21 DIAGNOSIS — R1013 Epigastric pain: Secondary | ICD-10-CM

## 2020-10-21 MED ORDER — SUCRALFATE 1 G PO TABS: 1.0000 g | ORAL_TABLET | Freq: Four times a day (QID) | ORAL | 0 refills | Status: DC

## 2020-10-21 MED ORDER — SODIUM CHLORIDE 0.9 % IV SOLN: 500.0000 mL | Freq: Once | INTRAVENOUS | Status: DC

## 2020-10-21 MED ORDER — PANTOPRAZOLE SODIUM 40 MG PO TBEC: 40.0000 mg | DELAYED_RELEASE_TABLET | Freq: Every day | ORAL | 3 refills | Status: DC

## 2020-10-21 NOTE — Progress Notes (Signed)
To PACU, VSS. Report to [email or web address] 

## 2020-10-21 NOTE — Progress Notes (Signed)
Pt's states no medical or surgical changes since previsit or office visit.   DT vitals and Kp IV.

## 2020-10-21 NOTE — Progress Notes (Signed)
Called to room to assist during endoscopic procedure.  Patient ID and intended procedure confirmed with present staff. Received instructions for my participation in the procedure from the performing physician.  

## 2020-10-21 NOTE — Op Note (Signed)
[facility] Patient Name: Brandy Cruz Procedure Date: 10/21/2020 2:18 PM MRN: [id number] Endoscopist: Lynann Bologna , MD Age: 70 Referring MD:  Date of Birth: 01-17-1951 Gender: Female Account #: [id number] Procedure:                Upper GI endoscopy Indications:              Epigastric abdominal pain Medicines:                Monitored Anesthesia Care Procedure:                Pre-Anesthesia Assessment:                           - Prior to the procedure, a History and Physical                            was performed, and patient medications and                            allergies were reviewed. The patient's tolerance of                            previous anesthesia was also reviewed. The risks                            and benefits of the procedure and the sedation                            options and risks were discussed with the patient.                            All questions were answered, and informed consent                            was obtained. Prior Anticoagulants: The patient has                            taken no previous anticoagulant or antiplatelet                            agents. ASA Grade Assessment: II - A patient with                            mild systemic disease. After reviewing the risks                            and benefits, the patient was deemed in                            satisfactory condition to undergo the procedure.                           After obtaining informed consent, the endoscope was  passed under direct vision. Throughout the                            procedure, the patient's blood pressure, pulse, and                            oxygen saturations were monitored continuously. The                            Endoscope was introduced through the mouth, and                            advanced to the second part of duodenum. The upper                            GI endoscopy was accomplished  without difficulty.                            The patient tolerated the procedure well. Scope In: Scope Out: Findings:                 The examined esophagus was normal with well-defined                            Z-line at 35 cm.                           A small hiatal hernia was present.                           Diffuse moderate inflammation characterized by                            erythema and granularity was found in the entire                            examined stomach. Biopsies were taken with a cold                            forceps for histology Sherron Ales protocol).                           A single 4 mm sessile polyp with no bleeding and no                            stigmata of recent bleeding was found in the                            gastric body. The polyp was removed with a cold                            biopsy forceps. Resection and retrieval were  complete.                           The examined duodenum was normal. Biopsies for                            histology were taken with a cold forceps for                            evaluation of celiac disease. Complications:            No immediate complications. Estimated Blood Loss:     Estimated blood loss: none. Impression:               - Small hiatal hernia.                           - Gastritis. Biopsied.                           - A single gastric polyp. Resected and retrieved.                           - Normal examined duodenum. Biopsied. Recommendation:           - Patient has a contact number available for                            emergencies. The signs and symptoms of potential                            delayed complications were discussed with the                            patient. Return to normal activities tomorrow.                            Written discharge instructions were provided to the                            patient.                           - Resume  previous diet.                           - Protonix 40 mg p.o. once a day #90, 3 refills.                           - Carafate 1 g p.o. 4 times daily x 2 weeks                           - Await pathology results.                           - The findings and recommendations were discussed  with the patient's family.                           - If still with problems, will perform further                            work-up. Lynann Bolognaajesh Bettylee Feig, MD 10/21/2020 2:48:46 PM This report has been signed electronically.

## 2020-10-21 NOTE — Progress Notes (Signed)
Chief Complaint: epi pain  Referring Provider:  Marylen Ponto, MD      ASSESSMENT AND PLAN;   #1. Epi pain. S/P lap chole in past.   #2. GERD  Plan: -Blood tests from Dr Leonor Liv. -EGD for further evaluation. -Continue Protonix on as needed basis for now. -Would continue Carafate for now.  Proceed with EGD. I have discussed the risks and benefits. The risks including rare risk of perforation, bleeding, missed UGI neoplasms, risks of anesthesia/sedation. Alternatives were given. Patient is aware and agrees to proceed. All the questions were answered. This will be scheduled in upcoming days. Consent forms were given for review.  HPI:    Brandy Cruz is a 70 y.o. female   C/O epi pain x 4-5 weeks, mostly after eating.  Getting somewhat worse.  She has been on Carafate for several years.  She now takes it on as-needed basis.  She also takes Protonix on as needed basis.  She denies having any odynophagia or dysphagia.  No definite food intolerance except milk which causes her to have more bloating.  She denies having any fever chills or night sweats no recent melena or hematochezia.  Rare nausea but no vomiting.  She continues to be on high-fiber diet.  No further episodes of acute diverticulitis.  S/P chole in past.  She was seen by Dr. Leonor Liv.  We do not have any records currently.  She apparently had normal labs.  She was sent to GI clinic for further evaluation.   Past GI procedures: Colonoscopy 05/2020 - Moderate sigmoid diverticulosis. - Non-bleeding internal hemorrhoids. - The examined portion of the ileum was normal. - The examination was otherwise normal on direct and retroflexion views. - No need to repeat unless problems.  CT AP 04/2020 1. Findings suggestive of acute sigmoid diverticulitis. Given the slightly asymmetric appearance of colonic wall thickening at this location, underlying colonic malignancy is not excluded. Recommend colonoscopy following resolution  of patient's acute symptoms. 2. Circumferential rectal wall thickening, which may represent proctitis. 3. Aortic atherosclerosis (ICD10-I70.0).     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 Internal hemorrhoids. Otherwise normal colonoscopy to terminal ileum   DILATION AND CURETTAGE OF UTERUS     ESOPHAGOGASTRODUODENOSCOPY  2001   TOTAL HIP ARTHROPLASTY Left 03/05/2018   Procedure: TOTAL HIP ARTHROPLASTY ANTERIOR APPROACH;  Surgeon: Ollen Gross, MD;  Location: [facility];  Service: Orthopedics;  Laterality: Left;   TOTAL KNEE ARTHROPLASTY Left 10/18/2014   Procedure: LEFT TOTAL KNEE ARTHROPLASTY;  Surgeon: Ollen Gross, MD;  Location: [facility];  Service: Orthopedics;  Laterality: Left;    Family History  Problem Relation Age of Onset   Arthritis Mother  Prostate cancer Father    Colon cancer Neg Hx    Stomach cancer Neg Hx    Pancreatic cancer Neg Hx    Esophageal cancer Neg Hx    Liver disease Neg Hx     Social History   Tobacco Use   Smoking status: Never   Smokeless tobacco: Never  Vaping Use   Vaping Use: Never used  Substance Use Topics   Alcohol use: Yes    Comment: occasionally   Drug use: No    Current Outpatient Medications  Medication Sig Dispense Refill   allopurinol (ZYLOPRIM) 100 MG tablet Take 100 mg by mouth  daily.     amLODipine (NORVASC) 2.5 MG tablet Take 2.5 mg by mouth at bedtime.     atorvastatin (LIPITOR) 10 MG tablet Take 1 tablet (10 mg total) by mouth 2 (two) times a week. 10 tablet 3   valsartan-hydrochlorothiazide (DIOVAN-HCT) 320-12.5 MG per tablet Take 1 tablet by mouth every morning.  12   venlafaxine XR (EFFEXOR-XR) 37.5 MG 24 hr capsule Take 1 capsule by mouth daily.     ALPRAZolam (XANAX) 0.25 MG tablet Take 0.25 mg by mouth 2 (two) times daily as needed for anxiety.     colchicine 0.6 MG tablet Take 0.6 mg by mouth daily as needed (gout).     pantoprazole (PROTONIX) 40 MG tablet Take 40 mg by mouth daily as needed.     sucralfate (CARAFATE) 1 g tablet Take 1 g by mouth 2 (two) times daily.      Current Facility-Administered Medications  Medication Dose Route Frequency Provider Last Rate Last Admin   0.9 %  sodium chloride infusion  500 mL Intravenous Once Lynann Bologna, MD        Allergies  Allergen Reactions   Sulfa Antibiotics Hives and Itching    + fever   Levaquin [Levofloxacin In D5w]     Dizziness and headache   Penicillins Rash    Childhood allergy.     Review of Systems:  Constitutional: Denies fever, chills, diaphoresis, appetite change and fatigue.  HEENT: Denies photophobia, eye pain, redness, hearing loss, ear pain, congestion, sore throat, rhinorrhea, sneezing, mouth sores, neck pain, neck stiffness and tinnitus.   Respiratory: Denies SOB, DOE, cough, chest tightness,  and wheezing.   Cardiovascular: Denies chest pain, palpitations and leg swelling.  Genitourinary: Denies dysuria, urgency, frequency, hematuria, flank pain and difficulty urinating.  Musculoskeletal: Denies myalgias, back pain, joint swelling, arthralgias and gait problem.  Skin: No rash.  Neurological: Denies dizziness, seizures, syncope, weakness, light-headedness, numbness and headaches.  Hematological: Denies adenopathy. Easy bruising, personal or family bleeding history   Psychiatric/Behavioral: No anxiety or depression     Physical Exam:    BP 138/72   Pulse 66   Temp (!) 96.6 F (35.9 C)   Ht 5\' 4"  (1.626 m)   Wt 155 lb (70.3 kg)   SpO2 97%   BMI 26.61 kg/m  Wt Readings from Last 3 Encounters:  10/21/20 155 lb (70.3 kg)  10/11/20 155 lb 4 oz (70.4 kg)  06/16/20 150 lb (68 kg)   Constitutional:  Well-developed, in no acute distress. Psychiatric: Normal mood and affect. Behavior is normal. HEENT: Pupils normal.  Conjunctivae are normal. No scleral icterus. Neck supple.  Cardiovascular: Normal rate, regular rhythm. No edema Pulmonary/chest: Effort normal and breath sounds normal. No wheezing, rales or rhonchi. Abdominal: Soft, nondistended. Nontender. Bowel sounds active throughout. There are no masses palpable. No hepatomegaly. Rectal: Deferred Neurological: Alert and  oriented to person place and time. Skin: Skin is warm and dry. No rashes noted.  Data Reviewed: I have personally reviewed following labs and imaging studies  CBC: CBC Latest Ref Rng & Units 03/06/2018 02/24/2018 10/20/2014  WBC 4.0 - 10.5 K/uL 13.1(H) 7.3 14.1(H)  Hemoglobin 12.0 - 15.0 g/dL 11.8(L) 13.4 10.0(L)  Hematocrit 36.0 - 46.0 % 34.9(L) 39.7 29.2(L)  Platelets 150 - 400 K/uL 272 330 233    CMP: CMP Latest Ref Rng & Units 05/13/2020 03/11/2020 02/29/2020  Glucose 70 - 99 mg/dL 229(N) - 94  BUN 6 - 23 mg/dL 9 - 9  Creatinine 9.89 - 1.20 mg/dL 2.11 - 9.41  Sodium 740 - 145 mEq/L 137 - 137  Potassium 3.5 - 5.1 mEq/L 3.3(L) - 3.7  Chloride 96 - 112 mEq/L 96 - 96  CO2 19 - 32 mEq/L 32 - 27  Calcium 8.4 - 10.5 mg/dL 9.5 - 9.3  Total Protein 6.0 - 8.5 g/dL - 6.9 -  Total Bilirubin 0.0 - 1.2 mg/dL - 0.7 -  Alkaline Phos 44 - 121 IU/L - 76 -  AST 0 - 40 IU/L - 21 -  ALT 0 - 32 IU/L - 19 -       Edman Circle, MD 10/21/2020, 2:25 PM  Cc: Marylen Ponto, MD

## 2020-10-21 NOTE — Patient Instructions (Signed)
Discharge instructions given. Prescriptions sent to pharmacy. Handouts on Gastritis and Hiatal Hernia. Resume previous medications. YOU HAD AN ENDOSCOPIC PROCEDURE TODAY AT THE [facility]:   Refer to the procedure report that was given to you for any specific questions about what was found during the examination.  If the procedure report does not answer your questions, please call your gastroenterologist to clarify.  If you requested that your care partner not be given the details of your procedure findings, then the procedure report has been included in a sealed envelope for you to review at your convenience later.  YOU SHOULD EXPECT: Some feelings of bloating in the abdomen. Passage of more gas than usual.  Walking can help get rid of the air that was put into your GI tract during the procedure and reduce the bloating. If you had a lower endoscopy (such as a colonoscopy or flexible sigmoidoscopy) you may notice spotting of blood in your stool or on the toilet paper. If you underwent a bowel prep for your procedure, you may not have a normal bowel movement for a few days.  Please Note:  You might notice some irritation and congestion in your nose or some drainage.  This is from the oxygen used during your procedure.  There is no need for concern and it should clear up in a day or so.  SYMPTOMS TO REPORT IMMEDIATELY:   Following upper endoscopy (EGD)  Vomiting of blood or coffee ground material  New chest pain or pain under the shoulder blades  Painful or persistently difficult swallowing  New shortness of breath  Fever of 100F or higher  Black, tarry-looking stools  For urgent or emergent issues, a gastroenterologist can be reached at any hour by calling [phone]. Do not use MyChart messaging for urgent concerns.    DIET:  We do recommend a small meal at first, but then you may proceed to your regular diet.  Drink plenty of fluids but you should avoid alcoholic  beverages for 24 hours.  ACTIVITY:  You should plan to take it easy for the rest of today and you should NOT DRIVE or use heavy machinery until tomorrow (because of the sedation medicines used during the test).    FOLLOW UP: Our staff will call the number listed on your records 48-72 hours following your procedure to check on you and address any questions or concerns that you may have regarding the information given to you following your procedure. If we do not reach you, we will leave a message.  We will attempt to reach you two times.  During this call, we will ask if you have developed any symptoms of COVID 19. If you develop any symptoms (ie: fever, flu-like symptoms, shortness of breath, cough etc.) before then, please call [phone].  If you test positive for Covid 19 in the 2 weeks post procedure, please call and report this information to [place].    If any biopsies were taken you will be contacted by phone or by letter within the next 1-3 weeks.  Please call us at [phone] if you have not heard about the biopsies in 3 weeks.    SIGNATURES/CONFIDENTIALITY: You and/or your care partner have signed paperwork which will be entered into your electronic medical record.  These signatures attest to the fact that that the information above on your After Visit Summary has been reviewed and is understood.  Full responsibility of the confidentiality of this discharge information lies with you and/or  your care-partner.

## 2020-10-25 ENCOUNTER — Telehealth: Payer: Self-pay | Admitting: *Deleted

## 2020-10-25 NOTE — Telephone Encounter (Signed)
  Follow up Call-  Call back number 10/21/2020 06/16/2020  Post procedure Call Back phone  # [phone]  Permission to leave phone message Yes Yes  Some recent data might be hidden     Patient questions:  Do you have a fever, pain , or abdominal swelling? No. Pain Score  0 *  Have you tolerated food without any problems? Yes.    Have you been able to return to your normal activities? Yes.    Do you have any questions about your discharge instructions: Diet   No. Medications  No. Follow up visit  No.  Do you have questions or concerns about your Care? No.  Actions: * If pain score is 4 or above: No action needed, pain <4.  Have you developed a fever since your procedure? no  2.   Have you had an respiratory symptoms (SOB or cough) since your procedure? no  3.   Have you tested positive for COVID 19 since your procedure no  4.   Have you had any family members/close contacts diagnosed with the COVID 19 since your procedure?  no   If yes to any of these questions please route to Laverna Peace, RN and Karlton Lemon, RN

## 2020-10-26 DIAGNOSIS — R300 Dysuria: Secondary | ICD-10-CM | POA: Diagnosis not present

## 2020-10-26 DIAGNOSIS — N760 Acute vaginitis: Secondary | ICD-10-CM | POA: Diagnosis not present

## 2020-10-26 DIAGNOSIS — Z6826 Body mass index (BMI) 26.0-26.9, adult: Secondary | ICD-10-CM | POA: Diagnosis not present

## 2020-10-30 ENCOUNTER — Other Ambulatory Visit: Payer: Self-pay | Admitting: Cardiology

## 2020-11-01 NOTE — Telephone Encounter (Signed)
Atorvastatin 10 mg # 30 x 3 refills sent to  [id number] - [place], [place] - [address]

## 2020-11-02 ENCOUNTER — Encounter: Payer: Self-pay | Admitting: Gastroenterology

## 2020-11-15 DIAGNOSIS — H02834 Dermatochalasis of left upper eyelid: Secondary | ICD-10-CM | POA: Diagnosis not present

## 2020-11-15 DIAGNOSIS — H02423 Myogenic ptosis of bilateral eyelids: Secondary | ICD-10-CM | POA: Diagnosis not present

## 2020-11-15 DIAGNOSIS — H57813 Brow ptosis, bilateral: Secondary | ICD-10-CM | POA: Diagnosis not present

## 2020-11-15 DIAGNOSIS — H0279 Other degenerative disorders of eyelid and periocular area: Secondary | ICD-10-CM | POA: Diagnosis not present

## 2020-11-15 DIAGNOSIS — H02413 Mechanical ptosis of bilateral eyelids: Secondary | ICD-10-CM | POA: Diagnosis not present

## 2020-11-15 DIAGNOSIS — H02831 Dermatochalasis of right upper eyelid: Secondary | ICD-10-CM | POA: Diagnosis not present

## 2020-11-15 DIAGNOSIS — H53483 Generalized contraction of visual field, bilateral: Secondary | ICD-10-CM | POA: Diagnosis not present

## 2020-11-22 DIAGNOSIS — D519 Vitamin B12 deficiency anemia, unspecified: Secondary | ICD-10-CM | POA: Diagnosis not present

## 2020-12-05 DIAGNOSIS — Z20828 Contact with and (suspected) exposure to other viral communicable diseases: Secondary | ICD-10-CM | POA: Diagnosis not present

## 2020-12-05 DIAGNOSIS — R509 Fever, unspecified: Secondary | ICD-10-CM | POA: Diagnosis not present

## 2020-12-05 DIAGNOSIS — J101 Influenza due to other identified influenza virus with other respiratory manifestations: Secondary | ICD-10-CM | POA: Diagnosis not present

## 2020-12-21 DIAGNOSIS — Z23 Encounter for immunization: Secondary | ICD-10-CM | POA: Diagnosis not present

## 2021-01-17 DIAGNOSIS — E538 Deficiency of other specified B group vitamins: Secondary | ICD-10-CM | POA: Diagnosis not present

## 2021-02-14 DIAGNOSIS — E538 Deficiency of other specified B group vitamins: Secondary | ICD-10-CM | POA: Diagnosis not present

## 2021-03-02 DIAGNOSIS — M25512 Pain in left shoulder: Secondary | ICD-10-CM | POA: Diagnosis not present

## 2021-03-08 DIAGNOSIS — L820 Inflamed seborrheic keratosis: Secondary | ICD-10-CM | POA: Diagnosis not present

## 2021-03-08 DIAGNOSIS — M1711 Unilateral primary osteoarthritis, right knee: Secondary | ICD-10-CM | POA: Diagnosis not present

## 2021-03-09 DIAGNOSIS — M25512 Pain in left shoulder: Secondary | ICD-10-CM | POA: Diagnosis not present

## 2021-03-09 HISTORY — DX: Pain in left shoulder: M25.512

## 2021-03-20 DIAGNOSIS — E538 Deficiency of other specified B group vitamins: Secondary | ICD-10-CM | POA: Diagnosis not present

## 2021-03-22 DIAGNOSIS — N3001 Acute cystitis with hematuria: Secondary | ICD-10-CM | POA: Diagnosis not present

## 2021-03-22 DIAGNOSIS — R0600 Dyspnea, unspecified: Secondary | ICD-10-CM | POA: Diagnosis not present

## 2021-04-04 DIAGNOSIS — M545 Low back pain, unspecified: Secondary | ICD-10-CM | POA: Diagnosis not present

## 2021-04-04 DIAGNOSIS — Z87440 Personal history of urinary (tract) infections: Secondary | ICD-10-CM | POA: Diagnosis not present

## 2021-04-04 DIAGNOSIS — Z6827 Body mass index (BMI) 27.0-27.9, adult: Secondary | ICD-10-CM | POA: Diagnosis not present

## 2021-04-14 DIAGNOSIS — M1711 Unilateral primary osteoarthritis, right knee: Secondary | ICD-10-CM | POA: Diagnosis not present

## 2021-04-21 DIAGNOSIS — E538 Deficiency of other specified B group vitamins: Secondary | ICD-10-CM | POA: Diagnosis not present

## 2021-04-25 DIAGNOSIS — D225 Melanocytic nevi of trunk: Secondary | ICD-10-CM | POA: Diagnosis not present

## 2021-04-25 DIAGNOSIS — D2239 Melanocytic nevi of other parts of face: Secondary | ICD-10-CM | POA: Diagnosis not present

## 2021-04-25 DIAGNOSIS — L820 Inflamed seborrheic keratosis: Secondary | ICD-10-CM | POA: Diagnosis not present

## 2021-04-25 DIAGNOSIS — L821 Other seborrheic keratosis: Secondary | ICD-10-CM | POA: Diagnosis not present

## 2021-04-25 DIAGNOSIS — L814 Other melanin hyperpigmentation: Secondary | ICD-10-CM | POA: Diagnosis not present

## 2021-05-15 DIAGNOSIS — Z0000 Encounter for general adult medical examination without abnormal findings: Secondary | ICD-10-CM | POA: Diagnosis not present

## 2021-05-15 DIAGNOSIS — D510 Vitamin B12 deficiency anemia due to intrinsic factor deficiency: Secondary | ICD-10-CM | POA: Diagnosis not present

## 2021-05-15 DIAGNOSIS — E785 Hyperlipidemia, unspecified: Secondary | ICD-10-CM | POA: Diagnosis not present

## 2021-05-22 DIAGNOSIS — E785 Hyperlipidemia, unspecified: Secondary | ICD-10-CM | POA: Diagnosis not present

## 2021-05-22 DIAGNOSIS — Z0000 Encounter for general adult medical examination without abnormal findings: Secondary | ICD-10-CM | POA: Diagnosis not present

## 2021-05-22 DIAGNOSIS — I10 Essential (primary) hypertension: Secondary | ICD-10-CM | POA: Diagnosis not present

## 2021-05-22 DIAGNOSIS — Z1331 Encounter for screening for depression: Secondary | ICD-10-CM | POA: Diagnosis not present

## 2021-05-22 DIAGNOSIS — Z6827 Body mass index (BMI) 27.0-27.9, adult: Secondary | ICD-10-CM | POA: Diagnosis not present

## 2021-05-29 DIAGNOSIS — Z01419 Encounter for gynecological examination (general) (routine) without abnormal findings: Secondary | ICD-10-CM | POA: Diagnosis not present

## 2021-05-29 DIAGNOSIS — Z1231 Encounter for screening mammogram for malignant neoplasm of breast: Secondary | ICD-10-CM | POA: Diagnosis not present

## 2021-05-29 DIAGNOSIS — Z6828 Body mass index (BMI) 28.0-28.9, adult: Secondary | ICD-10-CM | POA: Diagnosis not present

## 2021-06-07 ENCOUNTER — Encounter: Payer: Self-pay | Admitting: Cardiology

## 2021-06-07 ENCOUNTER — Ambulatory Visit: Payer: Medicare PPO | Admitting: Cardiology

## 2021-06-07 VITALS — BP 132/74 | HR 74 | Ht 63.0 in | Wt 159.4 lb

## 2021-06-07 DIAGNOSIS — E782 Mixed hyperlipidemia: Secondary | ICD-10-CM | POA: Diagnosis not present

## 2021-06-07 DIAGNOSIS — J189 Pneumonia, unspecified organism: Secondary | ICD-10-CM

## 2021-06-07 DIAGNOSIS — I10 Essential (primary) hypertension: Secondary | ICD-10-CM | POA: Diagnosis not present

## 2021-06-07 DIAGNOSIS — R2243 Localized swelling, mass and lump, lower limb, bilateral: Secondary | ICD-10-CM

## 2021-06-07 HISTORY — DX: Localized swelling, mass and lump, lower limb, bilateral: R22.43

## 2021-06-07 HISTORY — DX: Pneumonia, unspecified organism: J18.9

## 2021-06-07 NOTE — Patient Instructions (Signed)
Medication Instructions:  ? ? ?Your physician recommends that you continue on your current medications as directed. Please refer to the Current Medication list given to you today. ? ?*If you need a refill on your cardiac medications before your next appointment, please call your pharmacy* ? ? ?Lab Work:  BMET AND D DIMER TODAY  ? ? ?If you have labs (blood work) drawn today and your tests are completely normal, you will receive your results only by: ?MyChart Message (if you have MyChart) OR ?A paper copy in the mail ?If you have any lab test that is abnormal or we need to change your treatment, we will call you to review the results. ? ? ?Testing/Procedures: NONE ORDERED  TODAY ? ? ? ?Follow-Up: ?At [facility], you and your health needs are our priority.  As part of our continuing mission to provide you with exceptional heart care, we have created designated Provider Care Teams.  These Care Teams include your primary Cardiologist (physician) and Advanced Practice Providers (APPs -  Physician Assistants and Nurse Practitioners) who all work together to provide you with the care you need, when you need it. ? ?We recommend signing up for the patient portal called "MyChart".  Sign up information is provided on this After Visit Summary.  MyChart is used to connect with patients for Virtual Visits (Telemedicine).  Patients are able to view lab/test results, encounter notes, upcoming appointments, etc.  Non-urgent messages can be sent to your provider as well.   ?To learn more about what you can do with MyChart, go to [email or web address].   ? ?Your next appointment:   ?3 month(s) ? ?The format for your next appointment:   ?In Person ? ?Provider:   ?Jenne Campus, MD{ ? ? ? ?Other Instructions ? ? ?Important Information About Sugar ? ? ? ? ?  ?

## 2021-06-07 NOTE — Progress Notes (Signed)
?Cardiology Office Note:   ? ?Date:  06/07/2021  ? ?ID:  Brandy Cruz, DOB 1950-04-21, MRN [id number] ? ?PCP:  Ronita Hipps, MD  ?Cardiologist:  Jenne Campus, MD   ? ?Referring MD: Ronita Hipps, MD  ? ?Chief Complaint  ?Patient presents with  ? Leg Swelling  ?  Ongoing for weeks   ? ? ?History of Present Illness:   ? ?Brandy Cruz is a 71 y.o. female past medical history significant for atypical chest pain, she did have a stress test done which was negative, she also has history of dyslipidemia.  She requested to be seen because she noted for last few weeks swelling of lower extremities may be left slightly more swollen than the right.  Swelling is localized around the ankle.  She does not have proximal occlusion history of heart, she does not have any dyspnea with exertion no chest pain tightness squeezing pressure burning chest, otherwise seems to be doing well. ? ?Past Medical History:  ?Diagnosis Date  ? Anemia   ? hx of  ? Anxiety   ? hx of  ? Arthritis   ? Atypical chest pain 02/12/2017  ? Cataract   ? bilateral- lense implants  ? Depression   ? Dyslipidemia 03/18/2017  ? Electrocardiogram abnormal 02/13/2017  ? Gastritis   ? GERD (gastroesophageal reflux disease)   ? Hemorrhoids   ? 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 Hyperlipidemia   ? Hypertension   ? OA (osteoarthritis) of knee 10/18/2014  ? Osteoarthritis of right knee 10/18/2014  ? Pain of left hip joint 06/11/2017  ? ? ?Past Surgical History:  ?Procedure Laterality Date  ? APPENDECTOMY    ? 2009  ? CHOLECYSTECTOMY    ? 2013  ? COLONOSCOPY  07/19/2010  ? Mild sigmoid diverticulosis. Internal hemorrhoids. Otherwise normal colonoscopy to terminal ileum  ? DILATION AND CURETTAGE OF UTERUS    ? ESOPHAGOGASTRODUODENOSCOPY  2001  ? TOTAL HIP ARTHROPLASTY Left 03/05/2018  ? Procedure: TOTAL HIP ARTHROPLASTY ANTERIOR APPROACH;  Surgeon: Gaynelle Arabian, MD;  Location: [facility];  Service: Orthopedics;  Laterality: Left;  ? TOTAL KNEE ARTHROPLASTY  Left 10/18/2014  ? Procedure: LEFT TOTAL KNEE ARTHROPLASTY;  Surgeon: Gaynelle Arabian, MD;  Location: [facility];  Service: Orthopedics;  Laterality: Left;  ? ? ?Current Medications: ?Current Meds  ?Medication Sig  ? allopurinol (ZYLOPRIM) 100 MG tablet Take 100 mg by mouth daily.  ? ALPRAZolam (XANAX) 0.25 MG tablet Take 0.25 mg by mouth 2 (two) times daily as needed for anxiety.  ? amLODipine (NORVASC) 2.5 MG tablet Take 2.5 mg by mouth at bedtime.  ? atorvastatin (LIPITOR) 10 MG tablet TAKE 1 TABLET (10 MG TOTAL) BY MOUTH 2 (TWO) TIMES A WEEK.  ? pantoprazole (PROTONIX) 40 MG tablet Take 1 tablet (40 mg total) by mouth daily.  ? potassium chloride (KLOR-CON) 10 MEQ tablet Take 10 mEq by mouth daily.  ? sucralfate (CARAFATE) 1 g tablet Take 1 tablet (1 g total) by mouth 4 (four) times daily.  ? valsartan-hydrochlorothiazide (DIOVAN-HCT) 320-12.5 MG per tablet Take 1 tablet by mouth every morning.  ? venlafaxine XR (EFFEXOR-XR) 37.5 MG 24 hr capsule Take 1 capsule by mouth daily.  ? [DISCONTINUED] pantoprazole (PROTONIX) 40 MG tablet Take 40 mg by mouth daily as needed.  ?  ? ?Allergies:   Sulfa antibiotics, Levaquin [levofloxacin in d5w], and Penicillins  ? ?Social History  ? ?Socioeconomic History  ? Marital status: Married  ?  Spouse  name: Not on file  ? Number of children: 3  ? Years of education: Not on file  ? Highest education level: Not on file  ?Occupational History  ? Occupation: Retired  ?Tobacco Use  ? Smoking status: Never  ? Smokeless tobacco: Never  ?Vaping Use  ? Vaping Use: Never used  ?Substance and Sexual Activity  ? Alcohol use: Yes  ?  Comment: occasionally  ? Drug use: No  ? Sexual activity: Not Currently  ?Other Topics Concern  ? Not on file  ?Social History Narrative  ? Not on file  ? ?Social Determinants of Health  ? ?Financial Resource Strain: Not on file  ?Food Insecurity: Not on file  ?Transportation Needs: Not on file  ?Physical Activity: Not on file  ?Stress: Not on file  ?Social Connections:  Not on file  ?  ? ?Family History: ?The patient's family history includes Arthritis in her mother; Prostate cancer in her father. There is no history of Colon cancer, Stomach cancer, Pancreatic cancer, Esophageal cancer, or Liver disease. ?ROS:   ?Please see the history of present illness.    ?All 14 point review of systems negative except as described per history of present illness ? ?EKGs/Labs/Other Studies Reviewed:   ? ? ? ?Recent Labs: ?No results found for requested labs within last 8760 hours.  ?Recent Lipid Panel ?   ?Component Value Date/Time  ? CHOL 130 03/11/2020 0900  ? TRIG 127 03/11/2020 0900  ? HDL 53 03/11/2020 0900  ? CHOLHDL 2.5 03/11/2020 0900  ? LDLCALC 55 03/11/2020 0900  ? ? ?Physical Exam:   ? ?VS:  BP 132/74 (BP Location: Left Arm, Patient Position: Sitting)   Pulse 74   Ht 5\' 3"  (1.6 m)   Wt 159 lb 6.4 oz (72.3 kg)   SpO2 96%   BMI 28.24 kg/m?    ? ?Wt Readings from Last 3 Encounters:  ?06/07/21 159 lb 6.4 oz (72.3 kg)  ?10/21/20 155 lb (70.3 kg)  ?10/11/20 155 lb 4 oz (70.4 kg)  ?  ? ?GEN:  Well nourished, well developed in no acute distress ?HEENT: Normal ?NECK: No JVD; No carotid bruits ?LYMPHATICS: No lymphadenopathy ?CARDIAC: RRR, no murmurs, no rubs, no gallops ?RESPIRATORY:  Clear to auscultation without rales, wheezing or rhonchi  ?ABDOMEN: Soft, non-tender, non-distended ?MUSCULOSKELETAL:  No edema; No deformity  ?SKIN: Warm and dry ?LOWER EXTREMITIES: no swelling ?NEUROLOGIC:  Alert and oriented x 3 ?PSYCHIATRIC:  Normal affect  ? ?ASSESSMENT:   ? ?1. Primary hypertension   ?2. Localized swelling of both lower legs   ?3. Mixed hyperlipidemia   ? ?PLAN:   ? ?In order of problems listed above: ? ?Swelling of both lower extremities only minimal on physical exam around ankles.  I will ask her to have proBNP done.  We will do a D-dimer as well.  In the future she may require slightly higher dose of diuretic. ?Essential hypertension: Blood pressure well controlled continue present  management. ?Mixed dyslipidemia, I did review K PN which show me her LDL of 55 HDL 50 we will continue present management ? ? ?Medication Adjustments/Labs and Tests Ordered: ?Current medicines are reviewed at length with the patient today.  Concerns regarding medicines are outlined above.  ?No orders of the defined types were placed in this encounter. ? ?Medication changes: No orders of the defined types were placed in this encounter. ? ? ?Signed, ?Park Liter, MD, [facility] ?06/07/2021 11:51 AM    ?[place] ?

## 2021-06-07 NOTE — Addendum Note (Signed)
Addended by: Oleta Mouse on: 06/07/2021 11:55 AM ? ? Modules accepted: Orders ? ?

## 2021-06-08 LAB — BASIC METABOLIC PANEL
BUN/Creatinine Ratio: 13 (ref 12–28)
BUN: 9 mg/dL (ref 8–27)
CO2: 25 mmol/L (ref 20–29)
Calcium: 9.9 mg/dL (ref 8.7–10.3)
Chloride: 99 mmol/L (ref 96–106)
Creatinine, Ser: 0.69 mg/dL (ref 0.57–1.00)
Glucose: 99 mg/dL (ref 70–99)
Potassium: 3.9 mmol/L (ref 3.5–5.2)
Sodium: 139 mmol/L (ref 134–144)
eGFR: 92 mL/min/{1.73_m2} (ref >59)

## 2021-06-08 LAB — D-DIMER, QUANTITATIVE: D-DIMER: 0.57 mg/L FEU — ABNORMAL HIGH (ref 0.00–0.49)

## 2021-06-09 ENCOUNTER — Telehealth: Payer: Self-pay

## 2021-06-09 MED ORDER — FUROSEMIDE 20 MG PO TABS: 20.0000 mg | ORAL_TABLET | Freq: Every day | ORAL | 0 refills | Status: DC

## 2021-06-09 NOTE — Telephone Encounter (Signed)
Rx sent to pharmacy per Dr. Vanetta Shawl note. ?

## 2021-06-22 DIAGNOSIS — D510 Vitamin B12 deficiency anemia due to intrinsic factor deficiency: Secondary | ICD-10-CM | POA: Diagnosis not present

## 2021-06-22 DIAGNOSIS — K219 Gastro-esophageal reflux disease without esophagitis: Secondary | ICD-10-CM | POA: Diagnosis not present

## 2021-07-12 ENCOUNTER — Other Ambulatory Visit: Payer: Self-pay | Admitting: Cardiology

## 2021-07-14 DIAGNOSIS — Z6827 Body mass index (BMI) 27.0-27.9, adult: Secondary | ICD-10-CM | POA: Diagnosis not present

## 2021-07-14 DIAGNOSIS — E876 Hypokalemia: Secondary | ICD-10-CM | POA: Diagnosis not present

## 2021-07-14 DIAGNOSIS — R300 Dysuria: Secondary | ICD-10-CM | POA: Diagnosis not present

## 2021-08-03 DIAGNOSIS — D519 Vitamin B12 deficiency anemia, unspecified: Secondary | ICD-10-CM | POA: Diagnosis not present

## 2021-08-05 DIAGNOSIS — R110 Nausea: Secondary | ICD-10-CM | POA: Diagnosis not present

## 2021-08-05 DIAGNOSIS — K5732 Diverticulitis of large intestine without perforation or abscess without bleeding: Secondary | ICD-10-CM | POA: Diagnosis not present

## 2021-08-05 DIAGNOSIS — R1032 Left lower quadrant pain: Secondary | ICD-10-CM | POA: Diagnosis not present

## 2021-08-07 ENCOUNTER — Other Ambulatory Visit: Payer: Self-pay | Admitting: Gastroenterology

## 2021-08-07 DIAGNOSIS — K2900 Acute gastritis without bleeding: Secondary | ICD-10-CM

## 2021-08-09 DIAGNOSIS — M1611 Unilateral primary osteoarthritis, right hip: Secondary | ICD-10-CM | POA: Insufficient documentation

## 2021-08-09 DIAGNOSIS — M169 Osteoarthritis of hip, unspecified: Secondary | ICD-10-CM

## 2021-08-09 DIAGNOSIS — M25551 Pain in right hip: Secondary | ICD-10-CM | POA: Diagnosis not present

## 2021-08-09 HISTORY — DX: Osteoarthritis of hip, unspecified: M16.9

## 2021-08-25 DIAGNOSIS — D519 Vitamin B12 deficiency anemia, unspecified: Secondary | ICD-10-CM | POA: Diagnosis not present

## 2021-09-19 ENCOUNTER — Other Ambulatory Visit (HOSPITAL_COMMUNITY): Payer: Medicare PPO

## 2021-09-19 NOTE — H&P (Signed)
TOTAL HIP ADMISSION H&P  Patient is admitted for right total hip arthroplasty.  Subjective:  Chief Complaint: Right hip pain  HPI: Burley Saver, 71 y.o. female, has a history of pain and functional disability in the right hip due to arthritis and patient has failed non-surgical conservative treatments for greater than 12 weeks to include NSAID's and/or analgesics and activity modification. Onset of symptoms was gradual, starting  several  years ago with gradually worsening course since that time. The patient noted no past surgery on the right hip. Patient currently rates pain in the right hip at 7 out of 10 with activity. Patient has night pain, worsening of pain with activity and weight bearing, and pain that interfers with activities of daily living. Patient has evidence of  end-stage arthritis of the hip. She is bone-on-bone centrally and superolaterally. She also has an osteophyte which is fragmented off the lateral acetabulum. She has a large osteophyte inferiorly. She has subchondral cystic changes  by imaging studies. This condition presents safety issues increasing the risk of falls. There is no current active infection.  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   Pain of left hip joint 06/11/2017   Dyslipidemia 03/18/2017   Electrocardiogram abnormal 02/13/2017   Atypical chest pain 02/12/2017   OA (osteoarthritis) of knee 10/18/2014   Osteoarthritis of right knee 10/18/2014    Past Medical History:  Diagnosis Date   Anemia    hx of   Anxiety    hx of   Arthritis    Atypical chest pain 02/12/2017   Cataract    bilateral- lense implants   Depression    Dyslipidemia  03/18/2017   Electrocardiogram abnormal 02/13/2017   Gastritis    GERD (gastroesophageal reflux disease)    Hemorrhoids    History of total knee replacement, 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 Internal hemorrhoids. Otherwise normal colonoscopy to terminal ileum   DILATION AND CURETTAGE OF UTERUS     ESOPHAGOGASTRODUODENOSCOPY  2001   TOTAL HIP ARTHROPLASTY Left 03/05/2018   Procedure: TOTAL HIP ARTHROPLASTY ANTERIOR APPROACH;  Surgeon: Ollen Gross, MD;  Location: [facility];  Service: Orthopedics;  Laterality: Left;   TOTAL KNEE ARTHROPLASTY Left 10/18/2014   Procedure: LEFT TOTAL KNEE ARTHROPLASTY;  Surgeon: Ollen Gross, MD;  Location: [facility];  Service: Orthopedics;  Laterality: Left;    Prior to Admission medications   Medication Sig Start Date End Date Taking? Authorizing Provider  acetaminophen (TYLENOL) 500 MG tablet Take 1,000 mg by mouth every 6 (six) hours as needed for moderate pain.   Yes [provider]  allopurinol (ZYLOPRIM) 100 MG tablet Take 100 mg by mouth daily.   Yes [provider]  calcium carbonate (TUMS - DOSED IN MG ELEMENTAL CALCIUM) 500 MG chewable  tablet Chew 3 tablets by mouth daily as needed for indigestion or heartburn.   Yes [provider]  pantoprazole (PROTONIX) 40 MG tablet TAKE 1 TABLET BY MOUTH EVERY DAY Patient taking differently: Take 40 mg by mouth daily as needed (heartburn). 08/07/21  Yes Lynann Bologna, MD  potassium chloride (KLOR-CON) 10 MEQ tablet Take 10 mEq by mouth daily.   Yes [provider]  valsartan-hydrochlorothiazide (DIOVAN-HCT) 320-12.5 MG per tablet Take 1 tablet by mouth every morning. 09/05/14  Yes  [provider]  venlafaxine XR (EFFEXOR-XR) 37.5 MG 24 hr capsule Take 1 capsule by mouth daily.   Yes [provider]  Vitamin D, Ergocalciferol, (DRISDOL) 1.25 MG (50000 UNIT) CAPS capsule Take 50,000 Units by mouth every 14 (fourteen) days.   Yes [provider]  atorvastatin (LIPITOR) 10 MG tablet TAKE 1 TABLET (10 MG TOTAL) BY MOUTH 2 (TWO) TIMES A WEEK. Patient not taking: Reported on 09/13/2021 11/03/20 07/26/22  Baldo Daub, MD  furosemide (LASIX) 20 MG tablet Take 1 tablet (20 mg total) by mouth daily. Patient not taking: Reported on 09/13/2021 07/12/21   Georgeanna Lea, MD    Allergies  Allergen Reactions   Sulfa Antibiotics Hives and Itching    + fever   Levaquin [Levofloxacin In D5w]     Dizziness and headache   Penicillins Rash    Childhood allergy.     Social History   Socioeconomic History   Marital status: Married    Spouse name: Not on file   Number of children: 3   Years of education: Not on file   Highest education level: Not on file  Occupational History   Occupation: Retired  Tobacco Use   Smoking status: Never   Smokeless tobacco: Never  Vaping Use   Vaping Use: Never used  Substance and Sexual Activity   Alcohol use: Yes    Comment: occasionally   Drug use: No   Sexual activity: Not Currently  Other Topics Concern   Not on file  Social History Narrative   Not on file   Social Determinants of Health   Financial Resource Strain: Not on file  Food Insecurity: Not on file  Transportation Needs: Not on file  Physical Activity: Not on file  Stress: Not on file  Social Connections: Not on file  Intimate Partner Violence: Not on file    Tobacco Use: Low Risk  (06/07/2021)   Patient History    Smoking Tobacco Use: Never    Smokeless Tobacco Use: Never    Passive Exposure: Not on file   Social History   Substance and Sexual Activity  Alcohol Use Yes   Comment: occasionally    Family History  Problem  Relation Age of Onset   Arthritis Mother    Prostate cancer Father    Colon cancer Neg Hx    Stomach cancer Neg Hx    Pancreatic cancer Neg Hx    Esophageal cancer Neg Hx    Liver disease Neg Hx     Review of Systems  Constitutional:  Negative for chills and fever.  HENT: Negative.    Eyes: Negative.   Respiratory:  Negative for cough and shortness of breath.   Cardiovascular:  Negative for chest pain and palpitations.  Gastrointestinal:  Negative for abdominal pain, constipation, diarrhea, nausea and vomiting.  Genitourinary:  Negative for dysuria, frequency and urgency.  Musculoskeletal:  Positive for joint pain.  Skin:  Negative for rash.   Objective:  Physical Exam:  Well nourished and well developed.  General: Alert and oriented x3, cooperative and pleasant, no acute distress.  Head: normocephalic, atraumatic, neck supple.  Eyes: EOMI.  Abdomen: non-tender to palpation and soft, normoactive bowel sounds. Musculoskeletal: Right Hip Exam:  The range of motion: Flexion to 100 degrees, Internal Rotation is minimal, External Rotation to 10 to 20 degrees, and abduction to 20 degrees with pain on any attempted range of motion of the right hip.  There is no tenderness over the greater trochanteric bursa.  Calves soft and nontender. Motor function intact in LE. Strength 5/5 LE bilaterally. Neuro: Distal pulses 2+. Sensation to light touch intact in LE.  Vital signs in last 24 hours: BP: ()/()  Arterial Line BP: ()/()   Imaging Review Plain radiographs demonstrate moderate degenerative joint disease of the right hip. The bone quality appears to be adequate for age and reported activity level.  Assessment/Plan:  End stage arthritis, right hip  The patient history, physical examination, clinical judgement of the provider and imaging studies are consistent with end stage degenerative joint disease of the right hip and total hip arthroplasty is deemed medically necessary. The  treatment options including medical management, injection therapy, arthroscopy and arthroplasty were discussed at length. The risks and benefits of total hip arthroplasty were presented and reviewed. The risks due to aseptic loosening, infection, stiffness, dislocation/subluxation, thromboembolic complications and other imponderables were discussed. The patient acknowledged the explanation, agreed to proceed with the plan and consent was signed. Patient is being admitted for inpatient treatment for surgery, pain control, PT, OT, prophylactic antibiotics, VTE prophylaxis, progressive ambulation and ADLs and discharge planning.The patient is planning to be discharged  home .  Therapy Plans: HEP Disposition: Home with Husband Planned DVT Prophylaxis: Xarelto 10 mg (hx gastritis) DME Needed: None PCP: Kennith Maes, MD (clearance received) TXA: IV Allergies: penicillins (childhood rxn - hives), sulfa antibiotics (hives), levaquin (tachycardia) Anesthesia Concerns: None BMI: 27.5 Last HgbA1c: not diabetic  [facility]  - Patient was instructed on what medications to stop prior to surgery. - Follow-up visit in 2 weeks with Dr. Wynelle Link - Begin physical therapy following surgery - Pre-operative lab work as pre-surgical testing - Prescriptions will be provided in hospital at time of discharge  R. Jaynie Bream, PA-C Orthopedic Surgery [facility] [place]

## 2021-09-20 NOTE — Patient Instructions (Signed)
DUE TO COVID-19 ONLY TWO VISITORS  (aged 73 and older)  ARE ALLOWED TO COME WITH YOU AND STAY IN THE WAITING ROOM ONLY DURING PRE OP AND PROCEDURE.   **NO VISITORS ARE ALLOWED IN THE SHORT STAY AREA OR RECOVERY ROOM!!**  IF YOU WILL BE ADMITTED INTO THE HOSPITAL YOU ARE ALLOWED ONLY FOUR SUPPORT PEOPLE DURING VISITATION HOURS ONLY (7 AM -8PM)   The support person(s) must pass our screening, gel in and out, and wear a mask at all times, including in the patient's room. Patients must also wear a mask when staff or their support person are in the room. Visitors GUEST BADGE MUST BE WORN VISIBLY  One adult visitor may remain with you overnight and MUST be in the room by 8 P.M.     Your procedure is scheduled on: 10/02/21   Report to [facility] Main Entrance    Report to admitting at : 10:00 AM   Call this number if you have problems the morning of surgery [phone]   Do not eat food :After Midnight.   After Midnight you may have the following liquids until: 9:30 AM DAY OF SURGERY  Water Black Coffee (sugar ok, NO MILK/CREAM OR CREAMERS)  Tea (sugar ok, NO MILK/CREAM OR CREAMERS) regular and decaf                             Plain Jell-O (NO RED)                                           Fruit ices (not with fruit pulp, NO RED)                                     Popsicles (NO RED)                                                                  Juice: apple, WHITE grape, WHITE cranberry Sports drinks like Gatorade (NO RED)              Drink  Ensure drink AT : 9:30 AM the day of surgery.     The day of surgery:  Drink ONE (1) Pre-Surgery Clear Ensure or G2 at AM the morning of surgery. Drink in one sitting. Do not sip.  This drink was given to you during your hospital  pre-op appointment visit. Nothing else to drink after completing the  Pre-Surgery Clear Ensure or G2.          If you have questions, please contact your surgeon's office.    Oral Hygiene is also  important to reduce your risk of infection.                                    Remember - BRUSH YOUR TEETH THE MORNING OF SURGERY WITH YOUR REGULAR TOOTHPASTE   Do NOT smoke after Midnight   Take these medicines the morning of surgery with A  SIP OF WATER: allopurinol. Pantoprazole,tylenol as needed.  DO NOT TAKE ANY ORAL DIABETIC MEDICATIONS DAY OF YOUR SURGERY  Bring CPAP mask and tubing day of surgery.                              You may not have any metal on your body including hair pins, jewelry, and body piercing             Do not wear make-up, lotions, powders, perfumes/cologne, or deodorant  Do not wear nail polish including gel and S&S, artificial/acrylic nails, or any other type of covering on natural nails including finger and toenails. If you have artificial nails, gel coating, etc. that needs to be removed by a nail salon please have this removed prior to surgery or surgery may need to be canceled/ delayed if the surgeon/ anesthesia feels like they are unable to be safely monitored.   Do not shave  48 hours prior to surgery.    Do not bring valuables to the hospital. [facility] IS NOT             RESPONSIBLE   FOR VALUABLES.   Contacts, dentures or bridgework may not be worn into surgery.   Bring small overnight bag day of surgery.   DO NOT BRING YOUR HOME MEDICATIONS TO THE HOSPITAL. PHARMACY WILL DISPENSE MEDICATIONS LISTED ON YOUR MEDICATION LIST TO YOU DURING YOUR ADMISSION IN THE HOSPITAL!    Patients discharged on the day of surgery will not be allowed to drive home.  Someone NEEDS to stay with you for the first 24 hours after anesthesia.   Special Instructions: Bring a copy of your healthcare power of attorney and living will documents         the day of surgery if you haven't scanned them before.              Please read over the following fact sheets you were given: IF YOU HAVE QUESTIONS ABOUT YOUR PRE-OP INSTRUCTIONS PLEASE CALL [phone]     [facility]  - Preparing for Surgery Before surgery, you can play an important role.  Because skin is not sterile, your skin needs to be as free of germs as possible.  You can reduce the number of germs on your skin by washing with CHG (chlorahexidine gluconate) soap before surgery.  CHG is an antiseptic cleaner which kills germs and bonds with the skin to continue killing germs even after washing. Please DO NOT use if you have an allergy to CHG or antibacterial soaps.  If your skin becomes reddened/irritated stop using the CHG and inform your nurse when you arrive at Short Stay. Do not shave (including legs and underarms) for at least 48 hours prior to the first CHG shower.  You may shave your face/neck. Please follow these instructions carefully:  1.  Shower with CHG Soap the night before surgery and the  morning of Surgery.  2.  If you choose to wash your hair, wash your hair first as usual with your  normal  shampoo.  3.  After you shampoo, rinse your hair and body thoroughly to remove the  shampoo.                           4.  Use CHG as you would any other liquid soap.  You can apply chg directly  to the skin and wash  Gently with a scrungie or clean washcloth.  5.  Apply the CHG Soap to your body ONLY FROM THE NECK DOWN.   Do not use on face/ open                           Wound or open sores. Avoid contact with eyes, ears mouth and genitals (private parts).                       Wash face,  Genitals (private parts) with your normal soap.             6.  Wash thoroughly, paying special attention to the area where your surgery  will be performed.  7.  Thoroughly rinse your body with warm water from the neck down.  8.  DO NOT shower/wash with your normal soap after using and rinsing off  the CHG Soap.                9.  Pat yourself dry with a clean towel.            10.  Wear clean pajamas.            11.  Place clean sheets on your bed the night of your first shower and do not  sleep  with pets. Day of Surgery : Do not apply any lotions/deodorants the morning of surgery.  Please wear clean clothes to the hospital/surgery center.  FAILURE TO FOLLOW THESE INSTRUCTIONS MAY RESULT IN THE CANCELLATION OF YOUR SURGERY PATIENT SIGNATURE_________________________________  NURSE SIGNATURE__________________________________  ________________________________________________________________________   Brandy Cruz  An incentive spirometer is a tool that can help keep your lungs clear and active. This tool measures how well you are filling your lungs with each breath. Taking long deep breaths may help reverse or decrease the chance of developing breathing (pulmonary) problems (especially infection) following: A long period of time when you are unable to move or be active. BEFORE THE PROCEDURE  If the spirometer includes an indicator to show your best effort, your nurse or respiratory therapist will set it to a desired goal. If possible, sit up straight or lean slightly forward. Try not to slouch. Hold the incentive spirometer in an upright position. INSTRUCTIONS FOR USE  Sit on the edge of your bed if possible, or sit up as far as you can in bed or on a chair. Hold the incentive spirometer in an upright position. Breathe out normally. Place the mouthpiece in your mouth and seal your lips tightly around it. Breathe in slowly and as deeply as possible, raising the piston or the ball toward the top of the column. Hold your breath for 3-5 seconds or for as long as possible. Allow the piston or ball to fall to the bottom of the column. Remove the mouthpiece from your mouth and breathe out normally. Rest for a few seconds and repeat Steps 1 through 7 at least 10 times every 1-2 hours when you are awake. Take your time and take a few normal breaths between deep breaths. The spirometer may include an indicator to show your best effort. Use the indicator as a goal to work toward during  each repetition. After each set of 10 deep breaths, practice coughing to be sure your lungs are clear. If you have an incision (the cut made at the time of surgery), support your incision when coughing by placing a pillow or rolled up towels  firmly against it. Once you are able to get out of bed, walk around indoors and cough well. You may stop using the incentive spirometer when instructed by your caregiver.  RISKS AND COMPLICATIONS Take your time so you do not get dizzy or light-headed. If you are in pain, you may need to take or ask for pain medication before doing incentive spirometry. It is harder to take a deep breath if you are having pain. AFTER USE Rest and breathe slowly and easily. It can be helpful to keep track of a log of your progress. Your caregiver can provide you with a simple table to help with this. If you are using the spirometer at home, follow these instructions: [place] IF:  You are having difficultly using the spirometer. You have trouble using the spirometer as often as instructed. Your pain medication is not giving enough relief while using the spirometer. You develop fever of 100.5 F (38.1 C) or higher. SEEK IMMEDIATE MEDICAL CARE IF:  You cough up bloody sputum that had not been present before. You develop fever of 102 F (38.9 C) or greater. You develop worsening pain at or near the incision site. MAKE SURE YOU:  Understand these instructions. Will watch your condition. Will get help right away if you are not doing well or get worse. Document Released: 06/25/2006 Document Revised: 05/07/2011 Document Reviewed: 08/26/2006 [facility] Patient Information 2014 [place], [place].   ________________________________________________________________________

## 2021-09-21 ENCOUNTER — Other Ambulatory Visit: Payer: Self-pay

## 2021-09-21 ENCOUNTER — Encounter (HOSPITAL_COMMUNITY)
Admission: RE | Admit: 2021-09-21 | Discharge: 2021-09-21 | Disposition: A | Discharge status: Home / Self Care | Payer: Medicare PPO | Source: Ambulatory Visit | Attending: Orthopedic Surgery | Admitting: Orthopedic Surgery

## 2021-09-21 ENCOUNTER — Encounter (HOSPITAL_COMMUNITY): Payer: Self-pay

## 2021-09-21 VITALS — BP 140/89 | HR 83 | Temp 98.3°F | Ht 64.0 in | Wt 154.0 lb

## 2021-09-21 DIAGNOSIS — Z01818 Encounter for other preprocedural examination: Secondary | ICD-10-CM | POA: Diagnosis not present

## 2021-09-21 DIAGNOSIS — I10 Essential (primary) hypertension: Secondary | ICD-10-CM | POA: Insufficient documentation

## 2021-09-21 LAB — CBC
HCT: 42.2 % (ref 36.0–46.0)
Hemoglobin: 14.7 g/dL (ref 12.0–15.0)
MCH: 33.5 pg (ref 26.0–34.0)
MCHC: 34.8 g/dL (ref 30.0–36.0)
MCV: 96.1 fL (ref 80.0–100.0)
Platelets: 331 10*3/uL (ref 150–400)
RBC: 4.39 MIL/uL (ref 3.87–5.11)
RDW: 13 % (ref 11.5–15.5)
WBC: 8.3 10*3/uL (ref 4.0–10.5)
nRBC: 0 % (ref 0.0–0.2)

## 2021-09-21 LAB — TYPE AND SCREEN
ABO/RH(D): O POS
Antibody Screen: NEGATIVE

## 2021-09-21 LAB — SURGICAL PCR SCREEN
MRSA, PCR: NEGATIVE
Staphylococcus aureus: NEGATIVE

## 2021-09-21 LAB — BASIC METABOLIC PANEL
Anion gap: 9 (ref 5–15)
BUN: 6 mg/dL — ABNORMAL LOW (ref 8–23)
CO2: 28 mmol/L (ref 22–32)
Calcium: 9.5 mg/dL (ref 8.9–10.3)
Chloride: 100 mmol/L (ref 98–111)
Creatinine, Ser: 0.59 mg/dL (ref 0.44–1.00)
GFR, Estimated: >60 mL/min (ref >60)
Glucose, Bld: 93 mg/dL (ref 70–99)
Potassium: 3.2 mmol/L — ABNORMAL LOW (ref 3.5–5.1)
Sodium: 137 mmol/L (ref 135–145)

## 2021-09-21 NOTE — Progress Notes (Addendum)
For Short Stay: COVID SWAB appointment date: Date of COVID positive in last 90 days:  Bowel Prep reminder:   For Anesthesia: PCP - Dr. Juleen China: Clearance: 05/22/21: Chart. Cardiologist - Dr. Georgeanna Lea. LOV: 06/07/21  Chest x-ray -  EKG -  Stress Test -  ECHO - 02/28/17 Cardiac Cath -  Pacemaker/ICD device last checked: Pacemaker orders received: Device Rep notified:  Spinal Cord Stimulator:  Sleep Study -  CPAP -   Fasting Blood Sugar -  Checks Blood Sugar _____ times a day Date and result of last Hgb A1c-  Blood Thinner Instructions: Aspirin Instructions: Last Dose:  Activity level: Can go up a flight of stairs and activities of daily living without stopping and without chest pain and/or shortness of breath   Able to exercise without chest pain and/or shortness of breath   Unable to go up a flight of stairs without chest pain and/or shortness of breath     Anesthesia review: Hx: HTN  Patient denies shortness of breath, fever, cough and chest pain at PAT appointment   Patient verbalized understanding of instructions that were given to them at the PAT appointment. Patient was also instructed that they will need to review over the PAT instructions again at home before surgery.

## 2021-09-22 ENCOUNTER — Ambulatory Visit: Payer: Medicare PPO | Admitting: Cardiology

## 2021-09-22 VITALS — BP 134/80 | HR 65 | Ht 64.0 in | Wt 157.0 lb

## 2021-09-22 DIAGNOSIS — I10 Essential (primary) hypertension: Secondary | ICD-10-CM

## 2021-09-22 DIAGNOSIS — R0789 Other chest pain: Secondary | ICD-10-CM | POA: Diagnosis not present

## 2021-09-22 DIAGNOSIS — E785 Hyperlipidemia, unspecified: Secondary | ICD-10-CM | POA: Diagnosis not present

## 2021-09-22 NOTE — Patient Instructions (Signed)

## 2021-09-22 NOTE — Progress Notes (Unsigned)
Cardiology Office Note:    Date:  09/22/2021   ID:  Brandy Cruz, DOB Jul 12, 1950, MRN [id number]  PCP:  Ronita Hipps, MD  Cardiologist:  Jenne Campus, MD    Referring MD: Ronita Hipps, MD   Chief Complaint  Patient presents with   Follow-up    History of Present Illness:    Brandy Cruz is a 71 y.o. female with past medical history significant for atypical chest pain, she did have a stress test which was normal and then she was still having some symptoms eventually last year she ended up having coronary CT angio which showed normal coronaries without significant obstruction.  She is getting ready to have hip replacement surgery done which is scheduled for seventh of next month.  She denies have any chest pain tightness squeezing pressure burning chest in spite of pain in her hip she is still able to walk with no difficulties and she is easily get 4 METS.  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 Internal hemorrhoids. Otherwise normal colonoscopy to terminal ileum   DILATION AND CURETTAGE OF UTERUS     ESOPHAGOGASTRODUODENOSCOPY  2001   TOTAL HIP ARTHROPLASTY Left 03/05/2018   Procedure: TOTAL HIP ARTHROPLASTY ANTERIOR APPROACH;  Surgeon: Gaynelle Arabian, MD;  Location: [facility]  ORS;  Service: Orthopedics;  Laterality: Left;   TOTAL KNEE ARTHROPLASTY Left 10/18/2014   Procedure: LEFT TOTAL KNEE ARTHROPLASTY;  Surgeon: Gaynelle Arabian, MD;  Location: [facility] ORS;  Service: Orthopedics;  Laterality: Left;    Current Medications: Current Meds  Medication Sig   acetaminophen (TYLENOL) 500 MG tablet Take 1,000 mg by mouth every 6 (six) hours as needed for moderate pain.   allopurinol (ZYLOPRIM) 100 MG tablet Take 100 mg by mouth daily.   pantoprazole (PROTONIX) 40 MG tablet TAKE 1 TABLET BY MOUTH EVERY DAY (Patient taking differently: Take 40 mg by mouth daily as needed (heartburn).)   potassium chloride (KLOR-CON) 10 MEQ tablet Take 10 mEq by mouth daily.   valsartan-hydrochlorothiazide (DIOVAN-HCT) 320-12.5 MG per tablet Take 1 tablet by mouth every morning.   venlafaxine XR (EFFEXOR-XR) 37.5 MG 24 hr capsule Take 1 capsule by mouth daily.   Vitamin D, Ergocalciferol, (DRISDOL) 1.25 MG (50000 UNIT) CAPS capsule Take 50,000 Units by mouth every 14 (fourteen) days.   [DISCONTINUED] atorvastatin (LIPITOR) 10 MG tablet TAKE 1 TABLET (10 MG TOTAL) BY MOUTH 2 (TWO) TIMES A WEEK.   [DISCONTINUED] calcium carbonate (TUMS - DOSED IN MG ELEMENTAL CALCIUM) 500 MG chewable tablet Chew 3 tablets by mouth daily as needed for indigestion or heartburn.     Allergies:   Sulfa antibiotics, Levaquin [  levofloxacin in d5w], and Penicillins   Social History   Socioeconomic History   Marital status: Married    Spouse name: Not on file   Number of children: 3   Years of education: Not on file   Highest education level: Not on file  Occupational History   Occupation: Retired  Tobacco Use   Smoking status: Never   Smokeless tobacco: Never  Vaping Use   Vaping Use: Never used  Substance and Sexual Activity   Alcohol use: Not Currently    Comment: occasionally   Drug use: No   Sexual activity: Not Currently  Other Topics Concern   Not on file  Social History Narrative   Not on file    Social Determinants of Health   Financial Resource Strain: Not on file  Food Insecurity: Not on file  Transportation Needs: Not on file  Physical Activity: Not on file  Stress: Not on file  Social Connections: Not on file     Family History: The patient's family history includes Arthritis in her mother; Prostate cancer in her father. There is no history of Colon cancer, Stomach cancer, Pancreatic cancer, Esophageal cancer, or Liver disease. ROS:   Please see the history of present illness.    All 14 point review of systems negative except as described per history of present illness  EKGs/Labs/Other Studies Reviewed:      Recent Labs: 09/21/2021: BUN 6; Creatinine, Ser 0.59; Hemoglobin 14.7; Platelets 331; Potassium 3.2; Sodium 137  Recent Lipid Panel    Component Value Date/Time   CHOL 130 03/11/2020 0900   TRIG 127 03/11/2020 0900   HDL 53 03/11/2020 0900   CHOLHDL 2.5 03/11/2020 0900   LDLCALC 55 03/11/2020 0900    Physical Exam:    VS:  BP 134/80 (BP Location: Left Arm, Patient Position: Sitting)   Pulse 65   Ht 5\' 4"  (1.626 m)   Wt 157 lb (71.2 kg)   SpO2 94%   BMI 26.95 kg/m     Wt Readings from Last 3 Encounters:  09/22/21 157 lb (71.2 kg)  09/21/21 154 lb (69.9 kg)  06/07/21 159 lb 6.4 oz (72.3 kg)     GEN:  Well nourished, well developed in no acute distress HEENT: Normal NECK: No JVD; No carotid bruits LYMPHATICS: No lymphadenopathy CARDIAC: RRR, no murmurs, no rubs, no gallops RESPIRATORY:  Clear to auscultation without rales, wheezing or rhonchi  ABDOMEN: Soft, non-tender, non-distended MUSCULOSKELETAL:  No edema; No deformity  SKIN: Warm and dry LOWER EXTREMITIES: no swelling NEUROLOGIC:  Alert and oriented x 3 PSYCHIATRIC:  Normal affect   ASSESSMENT:    1. Atypical chest pain   2. Primary hypertension   3. Dyslipidemia    PLAN:    In order of problems listed above:  Atypical chest pain denies having any so far cardiac work-up  negative coronary CT angio was normal last year. Essential hypertension blood pressure well controlled continue present management. Dyslipidemia did review K PN which show LDL of 55 HDL 50.  Good control continue present management Cardiovascular preop evaluation before hip replacement surgery.  Stress test negative, coronary CT angio normal last year, no symptomatology suggest reactivation of the problem, easily does have 4 METS.  Should proceed with no reservations   Medication Adjustments/Labs and Tests Ordered: Current medicines are reviewed at length with the patient today.  Concerns regarding medicines are outlined above.  No orders of the defined types were placed in this encounter.  Medication changes: No orders  of the defined types were placed in this encounter.   Signed, Georgeanna Lea, MD, [facility] 09/22/2021 2:47 PM     [facility]

## 2021-09-28 DIAGNOSIS — D510 Vitamin B12 deficiency anemia due to intrinsic factor deficiency: Secondary | ICD-10-CM | POA: Diagnosis not present

## 2021-10-02 ENCOUNTER — Ambulatory Visit (HOSPITAL_COMMUNITY): Payer: Medicare PPO | Admitting: Certified Registered"

## 2021-10-02 ENCOUNTER — Ambulatory Visit (HOSPITAL_COMMUNITY): Payer: Medicare PPO

## 2021-10-02 ENCOUNTER — Encounter (HOSPITAL_COMMUNITY): Payer: Self-pay | Admitting: Orthopedic Surgery

## 2021-10-02 ENCOUNTER — Other Ambulatory Visit: Payer: Self-pay

## 2021-10-02 ENCOUNTER — Observation Stay (HOSPITAL_COMMUNITY)
Admission: RE | Admit: 2021-10-02 | Discharge: 2021-10-03 | Disposition: A | Discharge status: Home / Self Care | Payer: Medicare PPO | Source: Ambulatory Visit | Attending: Orthopedic Surgery | Admitting: Orthopedic Surgery

## 2021-10-02 ENCOUNTER — Ambulatory Visit (HOSPITAL_BASED_OUTPATIENT_CLINIC_OR_DEPARTMENT_OTHER): Payer: Medicare PPO | Admitting: Certified Registered"

## 2021-10-02 ENCOUNTER — Ambulatory Visit: Admit: 2021-10-02 | Payer: Medicare PPO | Admitting: Orthopedic Surgery

## 2021-10-02 ENCOUNTER — Encounter (HOSPITAL_COMMUNITY)
Admission: RE | Disposition: A | Discharge status: Home / Self Care | Payer: Self-pay | Source: Ambulatory Visit | Attending: Orthopedic Surgery

## 2021-10-02 ENCOUNTER — Observation Stay (HOSPITAL_COMMUNITY): Payer: Medicare PPO

## 2021-10-02 DIAGNOSIS — Z96642 Presence of left artificial hip joint: Secondary | ICD-10-CM | POA: Diagnosis not present

## 2021-10-02 DIAGNOSIS — M1611 Unilateral primary osteoarthritis, right hip: Secondary | ICD-10-CM

## 2021-10-02 DIAGNOSIS — I10 Essential (primary) hypertension: Secondary | ICD-10-CM | POA: Diagnosis not present

## 2021-10-02 DIAGNOSIS — M169 Osteoarthritis of hip, unspecified: Secondary | ICD-10-CM | POA: Diagnosis present

## 2021-10-02 DIAGNOSIS — Z96652 Presence of left artificial knee joint: Secondary | ICD-10-CM | POA: Insufficient documentation

## 2021-10-02 DIAGNOSIS — Z79899 Other long term (current) drug therapy: Secondary | ICD-10-CM | POA: Insufficient documentation

## 2021-10-02 DIAGNOSIS — Z96641 Presence of right artificial hip joint: Secondary | ICD-10-CM | POA: Diagnosis not present

## 2021-10-02 DIAGNOSIS — Z471 Aftercare following joint replacement surgery: Secondary | ICD-10-CM | POA: Diagnosis not present

## 2021-10-02 HISTORY — DX: Unilateral primary osteoarthritis, right hip: M16.11

## 2021-10-02 HISTORY — PX: TOTAL HIP ARTHROPLASTY: SHX124

## 2021-10-02 SURGERY — ARTHROPLASTY, HIP, TOTAL, ANTERIOR APPROACH
Anesthesia: Spinal | Site: Hip | Laterality: Right

## 2021-10-02 SURGERY — ARTHROPLASTY, KNEE, TOTAL
Anesthesia: Choice | Site: Knee | Laterality: Right

## 2021-10-02 MED ORDER — MENTHOL 3 MG MT LOZG
1.0000 | LOZENGE | OROMUCOSAL | Status: DC | PRN
Start: 2021-10-02 — End: 2021-10-03

## 2021-10-02 MED ORDER — PHENYLEPHRINE HCL (PRESSORS) 10 MG/ML IV SOLN
INTRAVENOUS | Status: DC | PRN
  Administered 2021-10-02: 80 ug via INTRAVENOUS
  Administered 2021-10-02: 160 ug via INTRAVENOUS

## 2021-10-02 MED ORDER — BUPIVACAINE IN DEXTROSE 0.75-8.25 % IT SOLN
INTRATHECAL | Status: DC | PRN
  Administered 2021-10-02: 1.8 mL via INTRATHECAL

## 2021-10-02 MED ORDER — BISACODYL 10 MG RE SUPP: 10.0000 mg | Freq: Every day | RECTAL | Status: DC | PRN

## 2021-10-02 MED ORDER — HYDROCHLOROTHIAZIDE 12.5 MG PO TABS
12.5000 mg | ORAL_TABLET | Freq: Every day | ORAL | Status: DC
  Administered 2021-10-03: 12.5 mg via ORAL
  Filled 2021-10-02: qty 1

## 2021-10-02 MED ORDER — LACTATED RINGERS IV SOLN: INTRAVENOUS | Status: DC

## 2021-10-02 MED ORDER — DEXAMETHASONE SODIUM PHOSPHATE 10 MG/ML IJ SOLN
INTRAMUSCULAR | Status: AC
  Filled 2021-10-02: qty 1

## 2021-10-02 MED ORDER — CHLORHEXIDINE GLUCONATE 0.12 % MT SOLN
15.0000 mL | Freq: Once | OROMUCOSAL | Status: AC
  Administered 2021-10-02: 15 mL via OROMUCOSAL

## 2021-10-02 MED ORDER — VALSARTAN-HYDROCHLOROTHIAZIDE 320-12.5 MG PO TABS
1.0000 | ORAL_TABLET | Freq: Every morning | ORAL | Status: DC
Start: 2021-10-03 — End: 2021-10-02

## 2021-10-02 MED ORDER — POLYETHYLENE GLYCOL 3350 17 G PO PACK: 17.0000 g | PACK | Freq: Every day | ORAL | Status: DC | PRN

## 2021-10-02 MED ORDER — FENTANYL CITRATE PF 50 MCG/ML IJ SOSY
PREFILLED_SYRINGE | INTRAMUSCULAR | Status: AC
  Administered 2021-10-02: 50 ug via INTRAVENOUS
  Filled 2021-10-02: qty 2

## 2021-10-02 MED ORDER — ACETAMINOPHEN 325 MG PO TABS: 325.0000 mg | ORAL_TABLET | Freq: Four times a day (QID) | ORAL | Status: DC | PRN

## 2021-10-02 MED ORDER — DEXAMETHASONE SODIUM PHOSPHATE 10 MG/ML IJ SOLN
10.0000 mg | Freq: Once | INTRAMUSCULAR | Status: AC
  Administered 2021-10-03: 10 mg via INTRAVENOUS
  Filled 2021-10-02: qty 1

## 2021-10-02 MED ORDER — POVIDONE-IODINE 10 % EX SWAB
1.0000 | Freq: Once | CUTANEOUS | Status: AC
  Administered 2021-10-02: 1 via TOPICAL

## 2021-10-02 MED ORDER — EPHEDRINE SULFATE (PRESSORS) 50 MG/ML IJ SOLN
INTRAMUSCULAR | Status: DC | PRN
  Administered 2021-10-02 (×2): 10 mg via INTRAVENOUS

## 2021-10-02 MED ORDER — HYDROCODONE-ACETAMINOPHEN 7.5-325 MG PO TABS
1.0000 | ORAL_TABLET | ORAL | Status: DC | PRN
  Administered 2021-10-03 (×2): 1 via ORAL
  Filled 2021-10-02 (×2): qty 1

## 2021-10-02 MED ORDER — BUPIVACAINE-EPINEPHRINE (PF) 0.25% -1:200000 IJ SOLN
INTRAMUSCULAR | Status: DC | PRN
  Administered 2021-10-02: 30 mL

## 2021-10-02 MED ORDER — METOCLOPRAMIDE HCL 5 MG/ML IJ SOLN: 5.0000 mg | Freq: Three times a day (TID) | INTRAMUSCULAR | Status: DC | PRN

## 2021-10-02 MED ORDER — AMISULPRIDE (ANTIEMETIC) 5 MG/2ML IV SOLN: 10.0000 mg | Freq: Once | INTRAVENOUS | Status: DC | PRN

## 2021-10-02 MED ORDER — TRANEXAMIC ACID-NACL 1000-0.7 MG/100ML-% IV SOLN
1000.0000 mg | INTRAVENOUS | Status: AC
  Administered 2021-10-02: 1000 mg via INTRAVENOUS
  Filled 2021-10-02: qty 100

## 2021-10-02 MED ORDER — PANTOPRAZOLE SODIUM 40 MG PO TBEC: 40.0000 mg | DELAYED_RELEASE_TABLET | Freq: Every day | ORAL | Status: DC | PRN

## 2021-10-02 MED ORDER — VENLAFAXINE HCL ER 37.5 MG PO CP24
37.5000 mg | ORAL_CAPSULE | Freq: Every day | ORAL | Status: DC
  Administered 2021-10-02 – 2021-10-03 (×2): 37.5 mg via ORAL
  Filled 2021-10-02 (×2): qty 1

## 2021-10-02 MED ORDER — DOCUSATE SODIUM 100 MG PO CAPS
100.0000 mg | ORAL_CAPSULE | Freq: Two times a day (BID) | ORAL | Status: DC
  Administered 2021-10-02 – 2021-10-03 (×2): 100 mg via ORAL
  Filled 2021-10-02 (×2): qty 1

## 2021-10-02 MED ORDER — 0.9 % SODIUM CHLORIDE (POUR BTL) OPTIME
TOPICAL | Status: DC | PRN
  Administered 2021-10-02: 1000 mL

## 2021-10-02 MED ORDER — METHOCARBAMOL 500 MG PO TABS
500.0000 mg | ORAL_TABLET | Freq: Four times a day (QID) | ORAL | Status: DC | PRN
  Administered 2021-10-02 – 2021-10-03 (×2): 500 mg via ORAL
  Filled 2021-10-02 (×2): qty 1

## 2021-10-02 MED ORDER — MIDAZOLAM HCL 5 MG/5ML IJ SOLN
INTRAMUSCULAR | Status: DC | PRN
  Administered 2021-10-02: 2 mg via INTRAVENOUS

## 2021-10-02 MED ORDER — PROPOFOL 500 MG/50ML IV EMUL
INTRAVENOUS | Status: DC | PRN
  Administered 2021-10-02: 50 ug/kg/min via INTRAVENOUS

## 2021-10-02 MED ORDER — FENTANYL CITRATE PF 50 MCG/ML IJ SOSY
25.0000 ug | PREFILLED_SYRINGE | INTRAMUSCULAR | Status: DC | PRN
  Administered 2021-10-02: 50 ug via INTRAVENOUS

## 2021-10-02 MED ORDER — ONDANSETRON HCL 4 MG/2ML IJ SOLN
INTRAMUSCULAR | Status: AC
  Filled 2021-10-02: qty 2

## 2021-10-02 MED ORDER — ORAL CARE MOUTH RINSE: 15.0000 mL | Freq: Once | OROMUCOSAL | Status: AC

## 2021-10-02 MED ORDER — ONDANSETRON HCL 4 MG/2ML IJ SOLN
INTRAMUSCULAR | Status: DC | PRN
  Administered 2021-10-02: 4 mg via INTRAVENOUS

## 2021-10-02 MED ORDER — DIPHENHYDRAMINE HCL 12.5 MG/5ML PO ELIX: 12.5000 mg | ORAL_SOLUTION | ORAL | Status: DC | PRN

## 2021-10-02 MED ORDER — AMLODIPINE BESYLATE 5 MG PO TABS
5.0000 mg | ORAL_TABLET | Freq: Every day | ORAL | Status: DC
  Administered 2021-10-02: 5 mg via ORAL
  Filled 2021-10-02: qty 1

## 2021-10-02 MED ORDER — RIVAROXABAN 10 MG PO TABS
10.0000 mg | ORAL_TABLET | Freq: Every day | ORAL | Status: DC
  Administered 2021-10-03: 10 mg via ORAL
  Filled 2021-10-02: qty 1

## 2021-10-02 MED ORDER — MORPHINE SULFATE (PF) 2 MG/ML IV SOLN: 0.5000 mg | INTRAVENOUS | Status: DC | PRN

## 2021-10-02 MED ORDER — FLEET ENEMA 7-19 GM/118ML RE ENEM: 1.0000 | ENEMA | Freq: Once | RECTAL | Status: DC | PRN

## 2021-10-02 MED ORDER — BUPIVACAINE-EPINEPHRINE (PF) 0.25% -1:200000 IJ SOLN
INTRAMUSCULAR | Status: AC
  Filled 2021-10-02: qty 30

## 2021-10-02 MED ORDER — CEFAZOLIN SODIUM-DEXTROSE 2-4 GM/100ML-% IV SOLN
2.0000 g | INTRAVENOUS | Status: AC
  Administered 2021-10-02: 2 g via INTRAVENOUS
  Filled 2021-10-02: qty 100

## 2021-10-02 MED ORDER — METHOCARBAMOL 500 MG IVPB - SIMPLE MED
INTRAVENOUS | Status: AC
  Administered 2021-10-02: 500 mg via INTRAVENOUS
  Filled 2021-10-02: qty 55

## 2021-10-02 MED ORDER — POTASSIUM CHLORIDE ER 10 MEQ PO TBCR
10.0000 meq | EXTENDED_RELEASE_TABLET | Freq: Every day | ORAL | Status: DC
  Administered 2021-10-02 – 2021-10-03 (×2): 10 meq via ORAL
  Filled 2021-10-02 (×4): qty 1

## 2021-10-02 MED ORDER — HYDROCODONE-ACETAMINOPHEN 5-325 MG PO TABS
1.0000 | ORAL_TABLET | ORAL | Status: DC | PRN
  Administered 2021-10-02: 1 via ORAL
  Filled 2021-10-02: qty 1

## 2021-10-02 MED ORDER — ONDANSETRON HCL 4 MG/2ML IJ SOLN: 4.0000 mg | Freq: Four times a day (QID) | INTRAMUSCULAR | Status: DC | PRN

## 2021-10-02 MED ORDER — CEFAZOLIN SODIUM-DEXTROSE 2-4 GM/100ML-% IV SOLN
2.0000 g | Freq: Four times a day (QID) | INTRAVENOUS | Status: AC
  Administered 2021-10-02 – 2021-10-03 (×2): 2 g via INTRAVENOUS
  Filled 2021-10-02 (×2): qty 100

## 2021-10-02 MED ORDER — PHENYLEPHRINE HCL-NACL 20-0.9 MG/250ML-% IV SOLN
INTRAVENOUS | Status: DC | PRN
  Administered 2021-10-02: 50 ug/min via INTRAVENOUS

## 2021-10-02 MED ORDER — METOCLOPRAMIDE HCL 5 MG PO TABS: 5.0000 mg | ORAL_TABLET | Freq: Three times a day (TID) | ORAL | Status: DC | PRN

## 2021-10-02 MED ORDER — WATER FOR IRRIGATION, STERILE IR SOLN
Status: DC | PRN
  Administered 2021-10-02: 2000 mL

## 2021-10-02 MED ORDER — ACETAMINOPHEN 10 MG/ML IV SOLN
1000.0000 mg | Freq: Four times a day (QID) | INTRAVENOUS | Status: DC
  Administered 2021-10-02: 1000 mg via INTRAVENOUS
  Filled 2021-10-02: qty 100

## 2021-10-02 MED ORDER — MIDAZOLAM HCL 2 MG/2ML IJ SOLN
INTRAMUSCULAR | Status: AC
  Filled 2021-10-02: qty 2

## 2021-10-02 MED ORDER — SODIUM CHLORIDE 0.9 % IV SOLN: INTRAVENOUS | Status: DC

## 2021-10-02 MED ORDER — PROPOFOL 1000 MG/100ML IV EMUL
INTRAVENOUS | Status: AC
  Filled 2021-10-02: qty 100

## 2021-10-02 MED ORDER — ONDANSETRON HCL 4 MG PO TABS: 4.0000 mg | ORAL_TABLET | Freq: Four times a day (QID) | ORAL | Status: DC | PRN

## 2021-10-02 MED ORDER — PHENOL 1.4 % MT LIQD: 1.0000 | OROMUCOSAL | Status: DC | PRN

## 2021-10-02 MED ORDER — DEXAMETHASONE SODIUM PHOSPHATE 10 MG/ML IJ SOLN
8.0000 mg | Freq: Once | INTRAMUSCULAR | Status: AC
  Administered 2021-10-02: 8 mg via INTRAVENOUS

## 2021-10-02 MED ORDER — METHOCARBAMOL 500 MG IVPB - SIMPLE MED: 500.0000 mg | Freq: Four times a day (QID) | INTRAVENOUS | Status: DC | PRN

## 2021-10-02 MED ORDER — IRBESARTAN 150 MG PO TABS
300.0000 mg | ORAL_TABLET | Freq: Every day | ORAL | Status: DC
  Administered 2021-10-03: 300 mg via ORAL
  Filled 2021-10-02: qty 2

## 2021-10-02 MED ORDER — ALLOPURINOL 100 MG PO TABS
100.0000 mg | ORAL_TABLET | Freq: Every day | ORAL | Status: DC
  Administered 2021-10-03: 100 mg via ORAL
  Filled 2021-10-02: qty 1

## 2021-10-02 SURGICAL SUPPLY — 46 items
BAG COUNTER SPONGE SURGICOUNT (BAG) ×1 IMPLANT
BAG DECANTER FOR FLEXI CONT (MISCELLANEOUS) IMPLANT
BAG SPEC THK2 15X12 ZIP CLS (MISCELLANEOUS)
BAG SPNG CNTER NS LX DISP (BAG) ×1
BAG ZIPLOCK 12X15 (MISCELLANEOUS) IMPLANT
BLADE SAG 18X100X1.27 (BLADE) ×3 IMPLANT
COVER PERINEAL POST (MISCELLANEOUS) ×3 IMPLANT
COVER SURGICAL LIGHT HANDLE (MISCELLANEOUS) ×3 IMPLANT
CUP ACETBLR 48 OD SECTOR II (Hips) ×1 IMPLANT
DRAPE FOOT SWITCH (DRAPES) ×3 IMPLANT
DRAPE STERI IOBAN 125X83 (DRAPES) ×3 IMPLANT
DRAPE U-SHAPE 47X51 STRL (DRAPES) ×6 IMPLANT
DRSG AQUACEL AG ADV 3.5X10 (GAUZE/BANDAGES/DRESSINGS) ×3 IMPLANT
DURAPREP 26ML APPLICATOR (WOUND CARE) ×3 IMPLANT
ELECT REM PT RETURN 15FT ADLT (MISCELLANEOUS) ×3 IMPLANT
GLOVE BIO SURGEON STRL SZ 6.5 (GLOVE) IMPLANT
GLOVE BIO SURGEON STRL SZ7.5 (GLOVE) ×1 IMPLANT
GLOVE BIO SURGEON STRL SZ8 (GLOVE) ×3 IMPLANT
GLOVE BIOGEL PI IND STRL 6.5 (GLOVE) IMPLANT
GLOVE BIOGEL PI IND STRL 7.0 (GLOVE) IMPLANT
GLOVE BIOGEL PI IND STRL 8 (GLOVE) ×2 IMPLANT
GLOVE BIOGEL PI INDICATOR 6.5 (GLOVE)
GLOVE BIOGEL PI INDICATOR 7.0 (GLOVE)
GLOVE BIOGEL PI INDICATOR 8 (GLOVE) ×1
GOWN STRL REUS W/ TWL LRG LVL3 (GOWN DISPOSABLE) ×2 IMPLANT
GOWN STRL REUS W/ TWL XL LVL3 (GOWN DISPOSABLE) IMPLANT
GOWN STRL REUS W/TWL LRG LVL3 (GOWN DISPOSABLE) ×2
GOWN STRL REUS W/TWL XL LVL3 (GOWN DISPOSABLE) ×2
HEAD CERAMIC DELTA 28M 12/14P5 (Head) ×1 IMPLANT
HOLDER FOLEY CATH W/STRAP (MISCELLANEOUS) ×3 IMPLANT
KIT TURNOVER KIT A (KITS) ×1 IMPLANT
LINER MARATHON 28 48 (Hips) ×1 IMPLANT
MANIFOLD NEPTUNE II (INSTRUMENTS) ×3 IMPLANT
PACK ANTERIOR HIP CUSTOM (KITS) ×3 IMPLANT
PENCIL SMOKE EVACUATOR COATED (MISCELLANEOUS) ×3 IMPLANT
SPIKE FLUID TRANSFER (MISCELLANEOUS) ×3 IMPLANT
STEM FEM SZ3 STD ACTIS (Stem) ×1 IMPLANT
STRIP CLOSURE SKIN 1/2X4 (GAUZE/BANDAGES/DRESSINGS) ×4 IMPLANT
SUT ETHIBOND NAB CT1 #1 30IN (SUTURE) ×3 IMPLANT
SUT MNCRL AB 4-0 PS2 18 (SUTURE) ×3 IMPLANT
SUT STRATAFIX 0 PDS 27 VIOLET (SUTURE) ×2
SUT VIC AB 2-0 CT1 27 (SUTURE) ×4
SUT VIC AB 2-0 CT1 TAPERPNT 27 (SUTURE) ×4 IMPLANT
SUTURE STRATFX 0 PDS 27 VIOLET (SUTURE) ×2 IMPLANT
TRAY FOLEY MTR SLVR 16FR STAT (SET/KITS/TRAYS/PACK) ×3 IMPLANT
TUBE SUCTION HIGH CAP CLEAR NV (SUCTIONS) ×3 IMPLANT

## 2021-10-02 NOTE — Anesthesia Postprocedure Evaluation (Signed)
Anesthesia Post Note  Patient: Brandy Cruz  Procedure(s) Performed: TOTAL HIP ARTHROPLASTY ANTERIOR APPROACH (Right: Hip)     Patient location during evaluation: PACU Anesthesia Type: Spinal Level of consciousness: awake and alert Pain management: pain level controlled Vital Signs Assessment: post-procedure vital signs reviewed and stable Respiratory status: spontaneous breathing and respiratory function stable Cardiovascular status: blood pressure returned to baseline and stable Postop Assessment: spinal receding Anesthetic complications: no   No notable events documented.  Last Vitals:  Vitals:   10/02/21 1645 10/02/21 1705  BP: (!) 121/57 119/72  Pulse: 71 74  Resp: 18 16  Temp: (!) 36.4 C 36.7 C  SpO2: 97% 96%    Last Pain:  Vitals:   10/02/21 1855  TempSrc:   PainSc: 2                  Kennieth Rad

## 2021-10-02 NOTE — Op Note (Signed)
OPERATIVE REPORT- TOTAL HIP ARTHROPLASTY   PREOPERATIVE DIAGNOSIS: Osteoarthritis of the Right hip.   POSTOPERATIVE DIAGNOSIS: Osteoarthritis of the Right  hip.   PROCEDURE: Right total hip arthroplasty, anterior approach.   SURGEON: Ollen Gross, MD   ASSISTANT: Leilani Able, PA-C  ANESTHESIA:  Spinal  ESTIMATED BLOOD LOSS:-250 mL    DRAINS: None  COMPLICATIONS: None   CONDITION: PACU - hemodynamically stable.   BRIEF CLINICAL NOTE: Brandy Cruz is a 71 y.o. female who has advanced end-  stage arthritis of their Right  hip with progressively worsening pain and  dysfunction.The patient has failed nonoperative management and presents for  total hip arthroplasty.   PROCEDURE IN DETAIL: After successful administration of spinal  anesthetic, the traction boots for the [facility] bed were placed on both  feet and the patient was placed onto the [facility] bed, boots placed into the leg  holders. The Right hip was then isolated from the perineum with plastic  drapes and prepped and draped in the usual sterile fashion. ASIS and  greater trochanter were marked and a oblique incision was made, starting  at about 1 cm lateral and 2 cm distal to the ASIS and coursing towards  the anterior cortex of the femur. The skin was cut with a 10 blade  through subcutaneous tissue to the level of the fascia overlying the  tensor fascia lata muscle. The fascia was then incised in line with the  incision at the junction of the anterior third and posterior 2/3rd. The  muscle was teased off the fascia and then the interval between the TFL  and the rectus was developed. The Hohmann retractor was then placed at  the top of the femoral neck over the capsule. The vessels overlying the  capsule were cauterized and the fat on top of the capsule was removed.  A Hohmann retractor was then placed anterior underneath the rectus  femoris to give exposure to the entire anterior capsule. A T-shaped  capsulotomy  was performed. The edges were tagged and the femoral head  was identified.       Osteophytes are removed off the superior acetabulum.  The femoral neck was then cut in situ with an oscillating saw. Traction  was then applied to the left lower extremity utilizing the [employer]  traction. The femoral head was then removed. Retractors were placed  around the acetabulum and then circumferential removal of the labrum was  performed. Osteophytes were also removed. Reaming starts at 45 mm to  medialize and  Increased in 2 mm increments to 47 mm. We reamed in  approximately 40 degrees of abduction, 20 degrees anteversion. A 48 mm  pinnacle acetabular shell was then impacted in anatomic position under  fluoroscopic guidance with excellent purchase. We did not need to place  any additional dome screws. A 28 mm neutral + 4 marathon liner was then  placed into the acetabular shell.       The femoral lift was then placed along the lateral aspect of the femur  just distal to the vastus ridge. The leg was  externally rotated and capsule  was stripped off the inferior aspect of the femoral neck down to the  level of the lesser trochanter, this was done with electrocautery. The femur was lifted after this was performed. The  leg was then placed in an extended and adducted position essentially delivering the femur. We also removed the capsule superiorly and the piriformis from the piriformis fossa to  gain excellent exposure of the  proximal femur. Rongeur was used to remove some cancellous bone to get  into the lateral portion of the proximal femur for placement of the  initial starter reamer. The starter broaches was placed  the starter broach  and was shown to go down the center of the canal. Broaching  with the Actis system was then performed starting at size 0  coursing  Up to size 3. A size 3 had excellent torsional and rotational  and axial stability. The trial standard offset neck was then placed  with a 28  + 5 trial head. The hip was then reduced. We confirmed that  the stem was in the canal both on AP and lateral x-rays. It also has excellent sizing. The hip was reduced with outstanding stability through full extension and full external rotation.. AP pelvis was taken and the leg lengths were measured and found to be equal. Hip was then dislocated again and the femoral head and neck removed. The  femoral broach was removed. Size 3 Actis stem with a standard offset  neck was then impacted into the femur following native anteversion. Has  excellent purchase in the canal. Excellent torsional and rotational and  axial stability. It is confirmed to be in the canal on AP and lateral  fluoroscopic views. The 28 + 5 ceramic head was placed and the hip  reduced with outstanding stability. Again AP pelvis was taken and it  confirmed that the leg lengths were equal. The wound was then copiously  irrigated with saline solution and the capsule reattached and repaired  with Ethibond suture. 30 ml of .25% Bupivicaine was  injected into the capsule and into the edge of the tensor fascia lata as well as subcutaneous tissue. The fascia overlying the tensor fascia lata was then closed with a running #1 V-Loc. Subcu was closed with interrupted 2-0 Vicryl and subcuticular running 4-0 Monocryl. Incision was cleaned  and dried. Steri-Strips and a bulky sterile dressing applied. The patient was awakened and transported to  recovery in stable condition.        Please note that a surgical assistant was a medical necessity for this procedure to perform it in a safe and expeditious manner. Assistant was necessary to provide appropriate retraction of vital neurovascular structures and to prevent femoral fracture and allow for anatomic placement of the prosthesis.  Ollen Gross, M.D.

## 2021-10-02 NOTE — Interval H&P Note (Signed)
History and Physical Interval Note:  10/02/2021 11:37 AM  Brandy Cruz  has presented today for surgery, with the diagnosis of right hip osteoarthritis.  The various methods of treatment have been discussed with the patient and family. After consideration of risks, benefits and other options for treatment, the patient has consented to  Procedure(s): TOTAL HIP ARTHROPLASTY ANTERIOR APPROACH (Right) as a surgical intervention.  The patient's history has been reviewed, patient examined, no change in status, stable for surgery.  I have reviewed the patient's chart and labs.  Questions were answered to the patient's satisfaction.     Brandy Cruz

## 2021-10-02 NOTE — Care Plan (Signed)
Ortho Bundle Case Management Note  Patient Details  Name: Brandy Cruz MRN: [id number] Date of Birth: May 01, 1950  R THA on 10-02-21 DCP:  Home with husband DME:  No needs, has a RW PT:  HEP                   DME Arranged:  N/A DME Agency:  NA  HH Arranged:  NA HH Agency:  NA  Additional Comments: Please contact me with any questions of if this plan should need to change.  Ennis Forts, RN,CCM [facility]  [phone] 10/02/2021, 1:27 PM

## 2021-10-02 NOTE — Anesthesia Preprocedure Evaluation (Signed)
Anesthesia Evaluation  Patient identified by MRN, date of birth, ID band Patient awake    Reviewed: Allergy & Precautions, NPO status , Patient's Chart, lab work & pertinent test results  Airway Mallampati: II  TM Distance: >3 FB     Dental   Pulmonary neg pulmonary ROS,    breath sounds clear to auscultation       Cardiovascular hypertension, Pt. on medications  Rhythm:Regular Rate:Normal     Neuro/Psych negative neurological ROS     GI/Hepatic Neg liver ROS, GERD  ,  Endo/Other  negative endocrine ROS  Renal/GU negative Renal ROS     Musculoskeletal  (+) Arthritis ,   Abdominal   Peds  Hematology negative hematology ROS (+)   Anesthesia Other Findings   Reproductive/Obstetrics                             Lab Results  Component Value Date   WBC 8.3 09/21/2021   HGB 14.7 09/21/2021   HCT 42.2 09/21/2021   MCV 96.1 09/21/2021   PLT 331 09/21/2021   Lab Results  Component Value Date   CREATININE 0.59 09/21/2021   BUN 6 (L) 09/21/2021   NA 137 09/21/2021   K 3.2 (L) 09/21/2021   CL 100 09/21/2021   CO2 28 09/21/2021    Anesthesia Physical Anesthesia Plan  ASA: 2  Anesthesia Plan: Spinal   Post-op Pain Management: Ofirmev IV (intra-op)*   Induction:   PONV Risk Score and Plan: 2 and Dexamethasone and Ondansetron  Airway Management Planned: Natural Airway and Simple Face Mask  Additional Equipment:   Intra-op Plan:   Post-operative Plan:   Informed Consent: I have reviewed the patients History and Physical, chart, labs and discussed the procedure including the risks, benefits and alternatives for the proposed anesthesia with the patient or authorized representative who has indicated his/her understanding and acceptance.       Plan Discussed with: CRNA  Anesthesia Plan Comments:         Anesthesia Quick Evaluation

## 2021-10-02 NOTE — Transfer of Care (Signed)
Immediate Anesthesia Transfer of Care Note  Patient: Brandy Cruz  Procedure(s) Performed: TOTAL HIP ARTHROPLASTY ANTERIOR APPROACH (Right: Hip)  Patient Location: PACU  Anesthesia Type:Spinal  Level of Consciousness: awake and alert   Airway & Oxygen Therapy: Patient Spontanous Breathing and Patient connected to face mask oxygen  Post-op Assessment: Report given to RN and Post -op Vital signs reviewed and stable  Post vital signs: Reviewed and stable  Last Vitals:  Vitals Value Taken Time  BP    Temp    Pulse 73 10/02/21 1542  Resp 14 10/02/21 1542  SpO2 100 % 10/02/21 1542  Vitals shown include unvalidated device data.  Last Pain:  Vitals:   10/02/21 1137  TempSrc:   PainSc: 5       Patients Stated Pain Goal: 4 (10/02/21 1137)  Complications: No notable events documented.

## 2021-10-02 NOTE — Discharge Instructions (Addendum)
Gaynelle Arabian, MD Total Joint Specialist [facility] [place] [address], [address] [place], [place] [address] [phone]  ANTERIOR APPROACH TOTAL HIP REPLACEMENT POSTOPERATIVE DIRECTIONS     Hip Rehabilitation, Guidelines Following Surgery  The results of a hip operation are greatly improved after range of motion and muscle strengthening exercises. Follow all safety measures which are given to protect your hip. If any of these exercises cause increased pain or swelling in your joint, decrease the amount until you are comfortable again. Then slowly increase the exercises. Call your caregiver if you have problems or questions.   BLOOD CLOT PREVENTION Take a 10 mg Xarelto once a day for three weeks following surgery. Then discontinue. You may resume your vitamins/supplements once you have discontinued the Xarelto. Do not take any NSAIDs (Advil, Aleve, Ibuprofen, Meloxicam, etc.) until you have discontinued the Xarelto.   HOME CARE INSTRUCTIONS  Remove items at home which could result in a fall. This includes throw rugs or furniture in walking pathways.  ICE to the affected hip as frequently as 20-30 minutes an hour and then as needed for pain and swelling. Continue to use ice on the hip for pain and swelling from surgery. You may notice swelling that will progress down to the foot and ankle. This is normal after surgery. Elevate the leg when you are not up walking on it.   Continue to use the breathing machine which will help keep your temperature down.  It is common for your temperature to cycle up and down following surgery, especially at night when you are not up moving around and exerting yourself.  The breathing machine keeps your lungs expanded and your temperature down.  DIET You may resume your previous home diet once your are discharged from the hospital.  DRESSING / WOUND CARE / SHOWERING You have an adhesive waterproof bandage over the incision. Leave this in place  until your first follow-up appointment. Once you remove this you will not need to place another bandage.  You may begin showering 3 days following surgery, but do not submerge the incision under water.  ACTIVITY For the first 3-5 days, it is important to rest and keep the operative leg elevated. You should, as a general rule, rest for 50 minutes and walk/stretch for 10 minutes per hour. After 5 days, you may slowly increase activity as tolerated.  Perform the exercises you were provided twice a day for about 15-20 minutes each session. Begin these 2 days following surgery. Walk with your walker as instructed. Use the walker until you are comfortable transitioning to a cane. Walk with the cane in the opposite hand of the operative leg. You may discontinue the cane once you are comfortable and walking steadily. Avoid periods of inactivity such as sitting longer than an hour when not asleep. This helps prevent blood clots.  Do not drive a car for 6 weeks or until released by your surgeon.  Do not drive while taking narcotics.  TED HOSE STOCKINGS Wear the elastic stockings on both legs for three weeks following surgery during the day. You may remove them at night while sleeping.  WEIGHT BEARING Weight bearing as tolerated with assist device (walker, cane, etc) as directed, use it as long as suggested by your surgeon or therapist, typically at least 4-6 weeks.  POSTOPERATIVE CONSTIPATION PROTOCOL Constipation - defined medically as fewer than three stools per week and severe constipation as less than one stool per week.  One of the most common issues patients  have following surgery is constipation.  Even if you have a regular bowel pattern at home, your normal regimen is likely to be disrupted due to multiple reasons following surgery.  Combination of anesthesia, postoperative narcotics, change in appetite and fluid intake all can affect your bowels.  In order to avoid complications following surgery,  here are some recommendations in order to help you during your recovery period.  Colace (docusate) - Pick up an over-the-counter form of Colace or another stool softener and take twice a day as long as you are requiring postoperative pain medications.  Take with a full glass of water daily.  If you experience loose stools or diarrhea, hold the colace until you stool forms back up.  If your symptoms do not get better within 1 week or if they get worse, check with your doctor. Dulcolax (bisacodyl) - Pick up over-the-counter and take as directed by the product packaging as needed to assist with the movement of your bowels.  Take with a full glass of water.  Use this product as needed if not relieved by Colace only.  MiraLax (polyethylene glycol) - Pick up over-the-counter to have on hand.  MiraLax is a solution that will increase the amount of water in your bowels to assist with bowel movements.  Take as directed and can mix with a glass of water, juice, soda, coffee, or tea.  Take if you go more than two days without a movement.Do not use MiraLax more than once per day. Call your doctor if you are still constipated or irregular after using this medication for 7 days in a row.  If you continue to have problems with postoperative constipation, please contact the office for further assistance and recommendations.  If you experience "the worst abdominal pain ever" or develop nausea or vomiting, please contact the office immediatly for further recommendations for treatment.  ITCHING  If you experience itching with your medications, try taking only a single pain pill, or even half a pain pill at a time.  You can also use Benadryl over the counter for itching or also to help with sleep.   MEDICATIONS See your medication summary on the "After Visit Summary" that the nursing staff will review with you prior to discharge.  You may have some home medications which will be placed on hold until you complete the course  of blood thinner medication.  It is important for you to complete the blood thinner medication as prescribed by your surgeon.  Continue your approved medications as instructed at time of discharge.  PRECAUTIONS If you experience chest pain or shortness of breath - call 911 immediately for transfer to the hospital emergency department.  If you develop a fever greater that 101 F, purulent drainage from wound, increased redness or drainage from wound, foul odor from the wound/dressing, or calf pain - CONTACT YOUR SURGEON.                                                   FOLLOW-UP APPOINTMENTS Make sure you keep all of your appointments after your operation with your surgeon and caregivers. You should call the office at the above phone number and make an appointment for approximately two weeks after the date of your surgery or on the date instructed by your surgeon outlined in the "After Visit Summary".  RANGE OF  MOTION AND STRENGTHENING EXERCISES  These exercises are designed to help you keep full movement of your hip joint. Follow your caregiver's or physical therapist's instructions. Perform all exercises about fifteen times, three times per day or as directed. Exercise both hips, even if you have had only one joint replacement. These exercises can be done on a training (exercise) mat, on the floor, on a table or on a bed. Use whatever works the best and is most comfortable for you. Use music or television while you are exercising so that the exercises are a pleasant break in your day. This will make your life better with the exercises acting as a break in routine you can look forward to.  Lying on your back, slowly slide your foot toward your buttocks, raising your knee up off the floor. Then slowly slide your foot back down until your leg is straight again.  Lying on your back spread your legs as far apart as you can without causing discomfort.  Lying on your side, raise your upper leg and foot  straight up from the floor as far as is comfortable. Slowly lower the leg and repeat.  Lying on your back, tighten up the muscle in the front of your thigh (quadriceps muscles). You can do this by keeping your leg straight and trying to raise your heel off the floor. This helps strengthen the largest muscle supporting your knee.  Lying on your back, tighten up the muscles of your buttocks both with the legs straight and with the knee bent at a comfortable angle while keeping your heel on the floor.   POST-OPERATIVE OPIOID TAPER INSTRUCTIONS: It is important to wean off of your opioid medication as soon as possible. If you do not need pain medication after your surgery it is ok to stop day one. Opioids include: Codeine, Hydrocodone(Norco, Vicodin), Oxycodone(Percocet, oxycontin) and hydromorphone amongst others.  Long term and even short term use of opiods can cause: Increased pain response Dependence Constipation Depression Respiratory depression And more.  Withdrawal symptoms can include Flu like symptoms Nausea, vomiting And more Techniques to manage these symptoms Hydrate well Eat regular healthy meals Stay active Use relaxation techniques(deep breathing, meditating, yoga) Do Not substitute Alcohol to help with tapering If you have been on opioids for less than two weeks and do not have pain than it is ok to stop all together.  Plan to wean off of opioids This plan should start within one week post op of your joint replacement. Maintain the same interval or time between taking each dose and first decrease the dose.  Cut the total daily intake of opioids by one tablet each day Next start to increase the time between doses. The last dose that should be eliminated is the evening dose.   IF YOU ARE TRANSFERRED TO A SKILLED REHAB FACILITY If the patient is transferred to a skilled rehab facility following release from the hospital, a list of the current medications will be sent to the  facility for the patient to continue.  When discharged from the skilled rehab facility, please have the facility set up the patient's [place] prior to being released. Also, the skilled facility will be responsible for providing the patient with their medications at time of release from the facility to include their pain medication, the muscle relaxants, and their blood thinner medication. If the patient is still at the rehab facility at time of the two week follow up appointment, the skilled rehab facility will  also need to assist the patient in arranging follow up appointment in our office and any transportation needs.  MAKE SURE YOU:  Understand these instructions.  Get help right away if you are not doing well or get worse.    DENTAL ANTIBIOTICS:  In most cases prophylactic antibiotics for Dental procdeures after total joint surgery are not necessary.  Exceptions are as follows:  1. History of prior total joint infection  2. Severely immunocompromised (Organ Transplant, cancer chemotherapy, Rheumatoid biologic meds such as Humera)  3. Poorly controlled diabetes (A1C &gt; 8.0, blood glucose over 200)  If you have one of these conditions, contact your surgeon for an antibiotic prescription, prior to your dental procedure.    Pick up stool softner and laxative for home use following surgery while on pain medications. Do not submerge incision under water. Please use good hand washing techniques while changing dressing each day. May shower starting three days after surgery. Please use a clean towel to pat the incision dry following showers. Continue to use ice for pain and swelling after surgery. Do not use any lotions or creams on the incision until instructed by your surgeon.   Information on my medicine - XARELTO (Rivaroxaban)     Why was Xarelto prescribed for you? Xarelto was prescribed for you to reduce the risk of blood clots forming after orthopedic  surgery. The medical term for these abnormal blood clots is venous thromboembolism (VTE).  What do you need to know about xarelto ? Take your Xarelto ONCE DAILY at the same time every day. You may take it either with or without food.  If you have difficulty swallowing the tablet whole, you may crush it and mix in applesauce just prior to taking your dose.  Take Xarelto exactly as prescribed by your doctor and DO NOT stop taking Xarelto without talking to the doctor who prescribed the medication.  Stopping without other VTE prevention medication to take the place of Xarelto may increase your risk of developing a clot.  After discharge, you should have regular check-up appointments with your healthcare provider that is prescribing your Xarelto.    What do you do if you miss a dose? If you miss a dose, take it as soon as you remember on the same day then continue your regularly scheduled once daily regimen the next day. Do not take two doses of Xarelto on the same day.   Important Safety Information A possible side effect of Xarelto is bleeding. You should call your healthcare provider right away if you experience any of the following: Bleeding from an injury or your nose that does not stop. Unusual colored urine (red or dark brown) or unusual colored stools (red or black). Unusual bruising for unknown reasons. A serious fall or if you hit your head (even if there is no bleeding).  Some medicines may interact with Xarelto and might increase your risk of bleeding while on Xarelto. To help avoid this, consult your healthcare provider or pharmacist prior to using any new prescription or non-prescription medications, including herbals, vitamins, non-steroidal anti-inflammatory drugs (NSAIDs) and supplements.  This website has more information on Xarelto: VisitDestination.com.br.

## 2021-10-02 NOTE — Anesthesia Procedure Notes (Signed)
Spinal  Patient location during procedure: OR Start time: 10/02/2021 1:45 PM End time: 10/02/2021 1:53 PM Reason for block: surgical anesthesia Staffing Performed: anesthesiologist  Anesthesiologist: Marcene Duos, MD Performed by: Marcene Duos, MD Authorized by: Marcene Duos, MD   Preanesthetic Checklist Completed: patient identified, IV checked, site marked, risks and benefits discussed, surgical consent, monitors and equipment checked, pre-op evaluation and timeout performed Spinal Block Patient position: sitting Prep: DuraPrep Patient monitoring: heart rate, cardiac monitor, continuous pulse ox and blood pressure Approach: midline Location: L4-5 Injection technique: single-shot Needle Needle type: Pencan  Needle gauge: 24 G Needle length: 9 cm Assessment Sensory level: T4 Events: CSF return

## 2021-10-03 ENCOUNTER — Encounter (HOSPITAL_COMMUNITY): Payer: Self-pay | Admitting: Orthopedic Surgery

## 2021-10-03 DIAGNOSIS — Z96642 Presence of left artificial hip joint: Secondary | ICD-10-CM | POA: Diagnosis not present

## 2021-10-03 DIAGNOSIS — Z79899 Other long term (current) drug therapy: Secondary | ICD-10-CM | POA: Diagnosis not present

## 2021-10-03 DIAGNOSIS — Z96652 Presence of left artificial knee joint: Secondary | ICD-10-CM | POA: Diagnosis not present

## 2021-10-03 DIAGNOSIS — I10 Essential (primary) hypertension: Secondary | ICD-10-CM | POA: Diagnosis not present

## 2021-10-03 DIAGNOSIS — M1611 Unilateral primary osteoarthritis, right hip: Secondary | ICD-10-CM | POA: Diagnosis not present

## 2021-10-03 LAB — BASIC METABOLIC PANEL
Anion gap: 12 (ref 5–15)
BUN: 7 mg/dL — ABNORMAL LOW (ref 8–23)
CO2: 25 mmol/L (ref 22–32)
Calcium: 8.5 mg/dL — ABNORMAL LOW (ref 8.9–10.3)
Chloride: 101 mmol/L (ref 98–111)
Creatinine, Ser: 0.64 mg/dL (ref 0.44–1.00)
GFR, Estimated: >60 mL/min (ref >60)
Glucose, Bld: 166 mg/dL — ABNORMAL HIGH (ref 70–99)
Potassium: 3.6 mmol/L (ref 3.5–5.1)
Sodium: 138 mmol/L (ref 135–145)

## 2021-10-03 LAB — CBC
HCT: 35.6 % — ABNORMAL LOW (ref 36.0–46.0)
Hemoglobin: 12.1 g/dL (ref 12.0–15.0)
MCH: 33.2 pg (ref 26.0–34.0)
MCHC: 34 g/dL (ref 30.0–36.0)
MCV: 97.8 fL (ref 80.0–100.0)
Platelets: 278 10*3/uL (ref 150–400)
RBC: 3.64 MIL/uL — ABNORMAL LOW (ref 3.87–5.11)
RDW: 12.5 % (ref 11.5–15.5)
WBC: 12.5 10*3/uL — ABNORMAL HIGH (ref 4.0–10.5)
nRBC: 0 % (ref 0.0–0.2)

## 2021-10-03 MED ORDER — RIVAROXABAN 10 MG PO TABS: 10.0000 mg | ORAL_TABLET | Freq: Every day | ORAL | 0 refills | Status: AC

## 2021-10-03 MED ORDER — METHOCARBAMOL 500 MG PO TABS: 500.0000 mg | ORAL_TABLET | Freq: Four times a day (QID) | ORAL | 0 refills | Status: DC | PRN

## 2021-10-03 MED ORDER — HYDROCODONE-ACETAMINOPHEN 5-325 MG PO TABS
1.0000 | ORAL_TABLET | ORAL | Status: DC | PRN
  Administered 2021-10-03 (×2): 2 via ORAL
  Filled 2021-10-03 (×2): qty 2

## 2021-10-03 MED ORDER — HYDROCODONE-ACETAMINOPHEN 5-325 MG PO TABS: 1.0000 | ORAL_TABLET | Freq: Four times a day (QID) | ORAL | 0 refills | Status: DC | PRN

## 2021-10-03 NOTE — Evaluation (Signed)
Physical Therapy Evaluation Patient Details Name: STEVI HOLLINSHEAD MRN: [id number] DOB: 06-Sep-1950 Today's Date: 10/03/2021  History of Present Illness  Pt is a 71 year old female s/p R THA on 10/02/21.  PMHx includes L THA, L TKA, anemia, anxiety, HLD, HTN  Clinical Impression  Pt is s/p THA resulting in the deficits listed below (see PT Problem List).  Pt will benefit from skilled PT to increase their independence and safety with mobility to allow discharge to the venue listed below.  Pt ambulated in hallway and performed LE exercises. Pt provided with HEP handout.  Pt anticipates d/c home with spouse later today after second session.        Recommendations for follow up therapy are one component of a multi-disciplinary discharge planning process, led by the attending physician.  Recommendations may be updated based on patient status, additional functional criteria and insurance authorization.  Follow Up Recommendations Follow physician's recommendations for discharge plan and follow up therapies      Assistance Recommended at Discharge PRN  Patient can return home with the following  Help with stairs or ramp for entrance;Assist for transportation    Equipment Recommendations None recommended by PT  Recommendations for Other Services       Functional Status Assessment Patient has had a recent decline in their functional status and demonstrates the ability to make significant improvements in function in a reasonable and predictable amount of time.     Precautions / Restrictions Precautions Precautions: Fall Restrictions RLE Weight Bearing: Weight bearing as tolerated      Mobility  Bed Mobility Overal bed mobility: Needs Assistance Bed Mobility: Supine to Sit     Supine to sit: Min guard, HOB elevated     General bed mobility comments: verbal cues for technique    Transfers Overall transfer level: Needs assistance Equipment used: Rolling walker (2 wheels) Transfers: Sit  to/from Stand Sit to Stand: Min guard           General transfer comment: verbal cues for UE and LE positioning    Ambulation/Gait Ambulation/Gait assistance: Min guard Gait Distance (Feet): 150 Feet Assistive device: Rolling walker (2 wheels) Gait Pattern/deviations: Step-to pattern, Decreased stance time - right, Antalgic       General Gait Details: verbal cues for sequence, RW positioning, step length, posture  Stairs            Wheelchair Mobility    Modified Rankin (Stroke Patients Only)       Balance                                             Pertinent Vitals/Pain Pain Assessment Pain Assessment: 0-10 Pain Score: 4  Pain Location: right hip Pain Descriptors / Indicators: Burning Pain Intervention(s): Premedicated before session, Repositioned, Monitored during session    Home Living Family/patient expects to be discharged to:: Private residence Living Arrangements: Spouse/significant other Available Help at Discharge: Family;Available 24 hours/day Type of Home: House Home Access: Stairs to enter Entrance Stairs-Rails: None Entrance Stairs-Number of Steps: 1   Home Layout: One level Home Equipment: Agricultural consultant (2 wheels);Cane - single point      Prior Function Prior Level of Function : Independent/Modified Independent                     Hand Dominance  Extremity/Trunk Assessment        Lower Extremity Assessment Lower Extremity Assessment: RLE deficits/detail RLE Deficits / Details: anticipated post op hip weakness, hip grossly 2+/5, able to perform ankle pumps       Communication   Communication: No difficulties  Cognition Arousal/Alertness: Awake/alert Behavior During Therapy: WFL for tasks assessed/performed Overall Cognitive Status: Within Functional Limits for tasks assessed                                          General Comments      Exercises Total Joint  Exercises Ankle Circles/Pumps: AROM, Both, 10 reps Quad Sets: AROM, Both, 10 reps Heel Slides: AAROM, Right, 10 reps Hip ABduction/ADduction: AROM, Right, Supine, Standing, 10 reps, AAROM Long Arc Quad: AROM, Right, 10 reps, Seated Knee Flexion: AROM, Right, Standing, 10 reps Marching in Standing: AROM, Right, Standing, 10 reps   Assessment/Plan    PT Assessment Patient needs continued PT services  PT Problem List Decreased strength;Decreased mobility;Decreased knowledge of precautions;Decreased knowledge of use of DME;Pain       PT Treatment Interventions Stair training;Gait training;DME instruction;Therapeutic exercise;Balance training;Functional mobility training;Therapeutic activities;Patient/family education    PT Goals (Current goals can be found in the Care Plan section)  Acute Rehab PT Goals PT Goal Formulation: With patient Time For Goal Achievement: 10/07/21 Potential to Achieve Goals: Good    Frequency 7X/week     Co-evaluation               AM-PAC PT "6 Clicks" Mobility  Outcome Measure Help needed turning from your back to your side while in a flat bed without using bedrails?: A Little Help needed moving from lying on your back to sitting on the side of a flat bed without using bedrails?: A Little Help needed moving to and from a bed to a chair (including a wheelchair)?: A Little Help needed standing up from a chair using your arms (e.g., wheelchair or bedside chair)?: A Little Help needed to walk in hospital room?: A Little Help needed climbing 3-5 steps with a railing? : A Little 6 Click Score: 18    End of Session Equipment Utilized During Treatment: Gait belt Activity Tolerance: Patient tolerated treatment well Patient left: in chair;with call bell/phone within reach;with chair alarm set;with family/visitor present Nurse Communication: Mobility status PT Visit Diagnosis: Difficulty in walking, not elsewhere classified (R26.2)    Time:  2725-3664 PT Time Calculation (min) (ACUTE ONLY): 16 min   Charges:   PT Evaluation $PT Eval Low Complexity: 1 Low         Kati PT, DPT Physical Therapist Acute Rehabilitation Services Preferred contact method: Secure Chat Weekend Pager Only: [phone] Office: [phone]   Janan Halter Payson 10/03/2021, 11:42 AM

## 2021-10-03 NOTE — Progress Notes (Signed)
   Subjective: 1 Day Post-Op Procedure(s) (LRB): TOTAL HIP ARTHROPLASTY ANTERIOR APPROACH (Right) Patient reports pain as mild.   Patient seen in rounds by Dr. Lequita Halt. Patient is well, and has had no acute complaints or problems. Denies chest pain or SOB. No issues overnight. Foley catheter removed this AM. We will begin therapy today.  Objective: Vital signs in last 24 hours: Temp:  [97.5 F (36.4 C)-98.1 F (36.7 C)] 98 F (36.7 C) (08/08 0519) Pulse Rate:  [68-91] 81 (08/08 0519) Resp:  [13-20] 18 (08/08 0519) BP: (99-141)/(53-73) 141/67 (08/08 0519) SpO2:  [92 %-100 %] 97 % (08/08 0519) Weight:  [71.2 kg] 71.2 kg (08/07 1137)  Intake/Output from previous day:  Intake/Output Summary (Last 24 hours) at 10/03/2021 0804 Last data filed at 10/03/2021 0600 Gross per 24 hour  Intake 3035.07 ml  Output 2900 ml  Net 135.07 ml     Intake/Output this shift: No intake/output data recorded.  Labs: Recent Labs    10/03/21 0238  HGB 12.1   Recent Labs    10/03/21 0238  WBC 12.5*  RBC 3.64*  HCT 35.6*  PLT 278   Recent Labs    10/03/21 0238  NA 138  K 3.6  CL 101  CO2 25  BUN 7*  CREATININE 0.64  GLUCOSE 166*  CALCIUM 8.5*   No results for input(s): "LABPT", "INR" in the last 72 hours.  Exam: General - Patient is Alert and Oriented Extremity - Neurologically intact Neurovascular intact Sensation intact distally Dorsiflexion/Plantar flexion intact Dressing - dressing C/D/I Motor Function - intact, moving foot and toes well on exam.   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   Weight as of this encounter: 71.2 kg. Advance diet Up with therapy D/C IV fluids  DVT Prophylaxis - Xarelto Weight bearing as tolerated. Begin therapy.  Plan is to go Home after hospital stay. Plan for discharge with HEP later today if progresses with therapy and meeting goals. Follow-up in the office in 2 weeks.  The PDMP database was reviewed today prior to any opioid medications being prescribed to this patient.  , PA-C Orthopedic Surgery [phone] 10/03/2021, 8:04 AM

## 2021-10-03 NOTE — TOC Transition Note (Signed)
Transition of Care [facility]) - CM/SW Discharge Note   Patient Details  Name: Brandy Cruz MRN: [id number] Date of Birth: [birth year]  [facility] CM/SW Contact:  Lennart Pall, LCSW Phone Number: 10/03/2021, 11:29 AM   Clinical Narrative:    Met with pt and confirming she has all needed DME at home.  Plan for HEP.  No TOC needs.   Final next level of care: Home/Self Care Barriers to Discharge: No Barriers Identified   Patient Goals and CMS Choice Patient states their goals for this hospitalization and ongoing recovery are:: return home      Discharge Placement                       Discharge Plan and Services                DME Arranged: N/A DME Agency: NA       HH Arranged: NA HH Agency: NA        Social Determinants of Health (SDOH) Interventions     Readmission Risk Interventions     No data to display

## 2021-10-03 NOTE — Progress Notes (Signed)
Physical Therapy Treatment Patient Details Name: Brandy Cruz MRN: [id number] DOB: May 16, 1950 Today's Date: 10/03/2021   History of Present Illness Pt is a 71 year old female s/p R THA on 10/02/21.  PMHx includes L THA, L TKA, anemia, anxiety, HLD, HTN    PT Comments    Pt ambulated in hallway and practiced safe stair technique.  Pt feels ready for d/c home today.  Pt has HEP and educated on how to progress exercises.      Recommendations for follow up therapy are one component of a multi-disciplinary discharge planning process, led by the attending physician.  Recommendations may be updated based on patient status, additional functional criteria and insurance authorization.  Follow Up Recommendations  Follow physician's recommendations for discharge plan and follow up therapies     Assistance Recommended at Discharge PRN  Patient can return home with the following Help with stairs or ramp for entrance;Assist for transportation   Equipment Recommendations  None recommended by PT    Recommendations for Other Services       Precautions / Restrictions Precautions Precautions: Fall Restrictions RLE Weight Bearing: Weight bearing as tolerated     Mobility  Bed Mobility Overal bed mobility: Needs Assistance Bed Mobility: Supine to Sit     Supine to sit: Min guard, HOB elevated     General bed mobility comments: pt in recliner    Transfers Overall transfer level: Needs assistance Equipment used: Rolling walker (2 wheels) Transfers: Sit to/from Stand Sit to Stand: Min guard           General transfer comment: verbal cues for UE and LE positioning    Ambulation/Gait Ambulation/Gait assistance: Min guard Gait Distance (Feet): 200 Feet Assistive device: Rolling walker (2 wheels) Gait Pattern/deviations: Step-to pattern, Decreased stance time - right, Antalgic       General Gait Details: verbal cues for sequence, RW positioning, step length,  posture   Stairs Stairs: Yes Stairs assistance: Min guard Stair Management: Step to pattern, Forwards, With walker Number of Stairs: 1 General stair comments: verbal cues for technique and safety; pt reports understanding; performed twice   Wheelchair Mobility    Modified Rankin (Stroke Patients Only)       Balance                                            Cognition Arousal/Alertness: Awake/alert Behavior During Therapy: WFL for tasks assessed/performed Overall Cognitive Status: Within Functional Limits for tasks assessed                                          Exercises     General Comments        Pertinent Vitals/Pain Pain Assessment Pain Assessment: 0-10 Pain Score: 4  Pain Location: right hip Pain Descriptors / Indicators: Burning Pain Intervention(s): Repositioned, Monitored during session    Home Living                          Prior Function            PT Goals (current goals can now be found in the care plan section) Progress towards PT goals: Progressing toward goals    Frequency    7X/week  PT Plan Current plan remains appropriate    Co-evaluation              AM-PAC PT "6 Clicks" Mobility   Outcome Measure  Help needed turning from your back to your side while in a flat bed without using bedrails?: A Little Help needed moving from lying on your back to sitting on the side of a flat bed without using bedrails?: A Little Help needed moving to and from a bed to a chair (including a wheelchair)?: A Little Help needed standing up from a chair using your arms (e.g., wheelchair or bedside chair)?: A Little Help needed to walk in hospital room?: A Little Help needed climbing 3-5 steps with a railing? : A Little 6 Click Score: 18    End of Session Equipment Utilized During Treatment: Gait belt Activity Tolerance: Patient tolerated treatment well Patient left: in chair;with call  bell/phone within reach;with family/visitor present   PT Visit Diagnosis: Difficulty in walking, not elsewhere classified (R26.2)     Time: 1258-1310 PT Time Calculation (min) (ACUTE ONLY): 12 min  Charges:  $Gait Training: 8-22 mins                     Paulino Door, DPT Physical Therapist Acute Rehabilitation Services Preferred contact method: Secure Chat Weekend Pager Only: [phone] Office: [phone]    Kati L Payson 10/03/2021, 4:00 PM

## 2021-10-09 NOTE — Discharge Summary (Signed)
Patient ID: Brandy Cruz MRN: [id number] DOB/AGE: 73-Aug-1952 73 y.o.  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 female who was admitted 10/02/2021 for operative treatment ofOA (osteoarthritis) of hip. Patient has severe unremitting pain that affects sleep, daily activities, and work/hobbies. After pre-op clearance the patient was taken to the operating room on 10/02/2021 and underwent  Procedure(s): TOTAL HIP ARTHROPLASTY ANTERIOR APPROACH.    Patient was given perioperative antibiotics:  Anti-infectives (From admission, onward)    Start     Dose/Rate Route Frequency Ordered Stop   10/02/21 2000  ceFAZolin (ANCEF) IVPB 2g/100 mL premix        2 g 200 mL/hr over 30 Minutes Intravenous Every 6 hours 10/02/21 1655 10/03/21 0138   10/02/21 1130  ceFAZolin (ANCEF) IVPB 2g/100 mL premix        2 g 200 mL/hr over 30 Minutes Intravenous On call to O.R. 10/02/21 1123 10/02/21 1409        Patient was given sequential compression devices, early ambulation, and chemoprophylaxis to prevent DVT.  Patient benefited maximally from hospital stay and  there were no complications.    Recent vital signs: No data found.   Recent laboratory studies: No results for input(s): "WBC", "HGB", "HCT", "PLT", "NA", "K", "CL", "CO2", "BUN", "CREATININE", "GLUCOSE", "INR", "CALCIUM" in the last 72 hours.  Invalid input(s): "PT", "2"   Discharge Medications:   Allergies as of 10/03/2021       Reactions   Sulfa Antibiotics Hives, Itching   + fever   Levaquin [levofloxacin In D5w]    Dizziness and headache   Penicillins Rash   Childhood allergy.  Tolerated Cephalosporin Date: 10/03/21.        Medication List     STOP taking these medications    furosemide 20 MG tablet Commonly known as: LASIX   Vitamin D (Ergocalciferol) 1.25 MG (50000 UNIT) Caps capsule Commonly known as: DRISDOL       TAKE these medications    acetaminophen 500 MG tablet Commonly known as: TYLENOL Take 1,000 mg by mouth every 6 (six) hours as needed for moderate pain.   allopurinol 100 MG tablet Commonly known as: ZYLOPRIM Take 100 mg by mouth daily.   amLODipine 5 MG tablet Commonly known as: NORVASC Take 5 mg by mouth daily.   HYDROcodone-acetaminophen 5-325 MG tablet Commonly known as: NORCO/VICODIN Take 1-2 tablets by mouth every 6 (six) hours as needed for moderate pain or severe pain.   methocarbamol 500 MG tablet Commonly known as: ROBAXIN Take 1 tablet (500 mg total)  by mouth every 6 (six) hours as needed for muscle spasms.   pantoprazole 40 MG tablet Commonly known as: PROTONIX TAKE 1 TABLET BY MOUTH EVERY DAY What changed:  when to take this reasons to take this   potassium chloride 10 MEQ tablet Commonly known as: KLOR-CON Take 10 mEq by mouth daily.   rivaroxaban 10 MG Tabs tablet Commonly known as: XARELTO Take 1 tablet (10 mg total) by mouth daily with breakfast for 20 days.   valsartan-hydrochlorothiazide 320-12.5 MG tablet Commonly known as: DIOVAN-HCT Take 1 tablet by mouth every morning.   venlafaxine XR 37.5 MG 24 hr  capsule Commonly known as: EFFEXOR-XR Take 1 capsule by mouth daily.               Discharge Care Instructions  (From admission, onward)           Start     Ordered   10/03/21 0000  Weight bearing as tolerated        10/03/21 0806   10/03/21 0000  Change dressing       Comments: You have an adhesive waterproof bandage over the incision. Leave this in place until your first follow-up appointment. Once you remove this you will not need to place another bandage.   10/03/21 0806            Diagnostic Studies: DG Pelvis Portable  Result Date: 10/02/2021 CLINICAL DATA:  Status post right hip arthroplasty EXAM: PORTABLE PELVIS 1-2 VIEWS COMPARISON:  03/05/2018 FINDINGS: There is recent right hip arthroplasty. There are pockets of air in the soft tissues. There is previous left hip arthroplasty. No fracture is seen. IMPRESSION: Recent right hip arthroplasty.  Previous left hip arthroplasty. Electronically Signed   By: Ernie Avena M.D.   On: 10/02/2021 16:07   DG HIP UNILAT WITH PELVIS 1V RIGHT  Result Date: 10/02/2021 CLINICAL DATA:  Right hip replacement EXAM: DG HIP (WITH OR WITHOUT PELVIS) 1V RIGHT COMPARISON:  Radiograph 03/05/2018 FINDINGS: Intraoperative images during right hip arthroplasty. Normal alignment without evidence of loosening or periprosthetic fracture. Partially visualized left hip arthroplasty. IMPRESSION: Intraoperative images during right hip arthroplasty. Normal alignment. No evidence of immediate complication. Electronically Signed   By: Caprice Renshaw M.D.   On: 10/02/2021 15:26   DG C-Arm 1-60 Min-No Report  Result Date: 10/02/2021 Fluoroscopy was utilized by the requesting physician.  No radiographic interpretation.    Disposition: Discharge disposition: 01-Home or Self Care       Discharge Instructions     Call MD / Call 911   Complete by: As directed    If you experience chest pain or shortness of breath, CALL 911 and be transported to the  hospital emergency room.  If you develope a fever above 101 F, pus (white drainage) or increased drainage or redness at the wound, or calf pain, call your surgeon's office.   Change dressing   Complete by: As directed    You have an adhesive waterproof bandage over the incision. Leave this in place until your first follow-up appointment. Once you remove this you will not need to place another bandage.   Constipation Prevention   Complete by: As directed    Drink plenty of fluids.  Prune juice may be helpful.  You may use a stool softener, such as Colace (over the counter) 100 mg twice a day.  Use MiraLax (over the counter) for constipation as needed.   Diet - low sodium heart healthy   Complete by: As  directed    Do not sit on low chairs, stoools or toilet seats, as it may be difficult to get up from low surfaces   Complete by: As directed    Driving restrictions   Complete by: As directed    No driving for two weeks   Post-operative opioid taper instructions:   Complete by: As directed    POST-OPERATIVE OPIOID TAPER INSTRUCTIONS: It is important to wean off of your opioid medication as soon as possible. If you do not need pain medication after your surgery it is ok to stop day one. Opioids include: Codeine, Hydrocodone(Norco, Vicodin), Oxycodone(Percocet, oxycontin) and hydromorphone amongst others.  Long term and even short term use of opiods can cause: Increased pain response Dependence Constipation Depression Respiratory depression And more.  Withdrawal symptoms can include Flu like symptoms Nausea, vomiting And more Techniques to manage these symptoms Hydrate well Eat regular healthy meals Stay active Use relaxation techniques(deep breathing, meditating, yoga) Do Not substitute Alcohol to help with tapering If you have been on opioids for less than two weeks and do not have pain than it is ok to stop all together.  Plan to wean off of opioids This plan should start within  one week post op of your joint replacement. Maintain the same interval or time between taking each dose and first decrease the dose.  Cut the total daily intake of opioids by one tablet each day Next start to increase the time between doses. The last dose that should be eliminated is the evening dose.      TED hose   Complete by: As directed    Use stockings (TED hose) for three weeks on both leg(s).  You may remove them at night for sleeping.   Weight bearing as tolerated   Complete by: As directed         Follow-up Information     Eartha Inch, [place]. Go on 10/17/2021.   Specialty: Orthopedic Surgery Why: You are scheduled for a follow up appointment on 10-17-21 at 1:00 pm. Contact information: [address], [address] [place] [address] [phone]                  Signed: Arther Abbott 10/09/2021, 9:01 AM

## 2021-11-06 DIAGNOSIS — D510 Vitamin B12 deficiency anemia due to intrinsic factor deficiency: Secondary | ICD-10-CM | POA: Diagnosis not present

## 2021-11-08 DIAGNOSIS — H02834 Dermatochalasis of left upper eyelid: Secondary | ICD-10-CM | POA: Diagnosis not present

## 2021-11-08 DIAGNOSIS — H26491 Other secondary cataract, right eye: Secondary | ICD-10-CM | POA: Diagnosis not present

## 2021-11-08 DIAGNOSIS — H02831 Dermatochalasis of right upper eyelid: Secondary | ICD-10-CM | POA: Diagnosis not present

## 2021-11-08 DIAGNOSIS — H1011 Acute atopic conjunctivitis, right eye: Secondary | ICD-10-CM | POA: Diagnosis not present

## 2021-11-08 DIAGNOSIS — H43813 Vitreous degeneration, bilateral: Secondary | ICD-10-CM | POA: Diagnosis not present

## 2021-11-08 DIAGNOSIS — H40013 Open angle with borderline findings, low risk, bilateral: Secondary | ICD-10-CM | POA: Diagnosis not present

## 2021-11-14 DIAGNOSIS — Z5189 Encounter for other specified aftercare: Secondary | ICD-10-CM | POA: Diagnosis not present

## 2021-11-30 DIAGNOSIS — I10 Essential (primary) hypertension: Secondary | ICD-10-CM | POA: Diagnosis not present

## 2021-11-30 DIAGNOSIS — R102 Pelvic and perineal pain: Secondary | ICD-10-CM | POA: Diagnosis not present

## 2021-12-02 DIAGNOSIS — R110 Nausea: Secondary | ICD-10-CM | POA: Diagnosis not present

## 2021-12-02 DIAGNOSIS — R1084 Generalized abdominal pain: Secondary | ICD-10-CM | POA: Diagnosis not present

## 2021-12-12 DIAGNOSIS — E538 Deficiency of other specified B group vitamins: Secondary | ICD-10-CM | POA: Diagnosis not present

## 2021-12-12 DIAGNOSIS — E876 Hypokalemia: Secondary | ICD-10-CM | POA: Diagnosis not present

## 2021-12-12 DIAGNOSIS — L728 Other follicular cysts of the skin and subcutaneous tissue: Secondary | ICD-10-CM | POA: Diagnosis not present

## 2021-12-12 DIAGNOSIS — L820 Inflamed seborrheic keratosis: Secondary | ICD-10-CM | POA: Diagnosis not present

## 2021-12-19 DIAGNOSIS — Z5189 Encounter for other specified aftercare: Secondary | ICD-10-CM | POA: Diagnosis not present

## 2021-12-19 DIAGNOSIS — M1711 Unilateral primary osteoarthritis, right knee: Secondary | ICD-10-CM | POA: Diagnosis not present

## 2022-01-11 DIAGNOSIS — D519 Vitamin B12 deficiency anemia, unspecified: Secondary | ICD-10-CM | POA: Diagnosis not present

## 2022-01-20 ENCOUNTER — Other Ambulatory Visit: Payer: Self-pay | Admitting: Cardiology

## 2022-01-23 DIAGNOSIS — Z6827 Body mass index (BMI) 27.0-27.9, adult: Secondary | ICD-10-CM | POA: Diagnosis not present

## 2022-01-23 DIAGNOSIS — F32A Depression, unspecified: Secondary | ICD-10-CM | POA: Diagnosis not present

## 2022-01-23 DIAGNOSIS — F419 Anxiety disorder, unspecified: Secondary | ICD-10-CM | POA: Diagnosis not present

## 2022-01-23 DIAGNOSIS — R300 Dysuria: Secondary | ICD-10-CM | POA: Diagnosis not present

## 2022-01-23 DIAGNOSIS — I10 Essential (primary) hypertension: Secondary | ICD-10-CM | POA: Diagnosis not present

## 2022-01-23 DIAGNOSIS — K219 Gastro-esophageal reflux disease without esophagitis: Secondary | ICD-10-CM | POA: Diagnosis not present

## 2022-02-09 DIAGNOSIS — D519 Vitamin B12 deficiency anemia, unspecified: Secondary | ICD-10-CM | POA: Diagnosis not present

## 2022-02-11 DIAGNOSIS — J111 Influenza due to unidentified influenza virus with other respiratory manifestations: Secondary | ICD-10-CM | POA: Diagnosis not present

## 2022-02-11 DIAGNOSIS — R0981 Nasal congestion: Secondary | ICD-10-CM | POA: Diagnosis not present

## 2022-02-11 DIAGNOSIS — R051 Acute cough: Secondary | ICD-10-CM | POA: Diagnosis not present

## 2022-03-02 ENCOUNTER — Other Ambulatory Visit (INDEPENDENT_AMBULATORY_CARE_PROVIDER_SITE_OTHER): Payer: Medicare PPO

## 2022-03-02 ENCOUNTER — Encounter: Payer: Self-pay | Admitting: Gastroenterology

## 2022-03-02 ENCOUNTER — Ambulatory Visit: Payer: Medicare PPO | Admitting: Gastroenterology

## 2022-03-02 VITALS — BP 140/76 | HR 80 | Ht 64.0 in | Wt 163.0 lb

## 2022-03-02 DIAGNOSIS — K219 Gastro-esophageal reflux disease without esophagitis: Secondary | ICD-10-CM

## 2022-03-02 DIAGNOSIS — R1013 Epigastric pain: Secondary | ICD-10-CM

## 2022-03-02 DIAGNOSIS — K449 Diaphragmatic hernia without obstruction or gangrene: Secondary | ICD-10-CM

## 2022-03-02 LAB — COMPREHENSIVE METABOLIC PANEL
ALT: 15 U/L (ref 0–35)
AST: 16 U/L (ref 0–37)
Albumin: 4 g/dL (ref 3.5–5.2)
Alkaline Phosphatase: 66 U/L (ref 39–117)
BUN: 9 mg/dL (ref 6–23)
CO2: 31 mEq/L (ref 19–32)
Calcium: 9.2 mg/dL (ref 8.4–10.5)
Chloride: 98 mEq/L (ref 96–112)
Creatinine, Ser: 0.64 mg/dL (ref 0.40–1.20)
GFR: 87.37 mL/min (ref >60.00)
Glucose, Bld: 105 mg/dL — ABNORMAL HIGH (ref 70–99)
Potassium: 3.1 mEq/L — ABNORMAL LOW (ref 3.5–5.1)
Sodium: 137 mEq/L (ref 135–145)
Total Bilirubin: 0.5 mg/dL (ref 0.2–1.2)
Total Protein: 6.9 g/dL (ref 6.0–8.3)

## 2022-03-02 LAB — CBC WITH DIFFERENTIAL/PLATELET
Basophils Absolute: 0.1 10*3/uL (ref 0.0–0.1)
Basophils Relative: 1 % (ref 0.0–3.0)
Eosinophils Absolute: 0.3 10*3/uL (ref 0.0–0.7)
Eosinophils Relative: 3.5 % (ref 0.0–5.0)
HCT: 37.4 % (ref 36.0–46.0)
Hemoglobin: 12.9 g/dL (ref 12.0–15.0)
Lymphocytes Relative: 37.5 % (ref 12.0–46.0)
Lymphs Abs: 3 10*3/uL (ref 0.7–4.0)
MCHC: 34.4 g/dL (ref 30.0–36.0)
MCV: 94.2 fl (ref 78.0–100.0)
Monocytes Absolute: 0.6 10*3/uL (ref 0.1–1.0)
Monocytes Relative: 7.9 % (ref 3.0–12.0)
Neutro Abs: 4 10*3/uL (ref 1.4–7.7)
Neutrophils Relative %: 50.1 % (ref 43.0–77.0)
Platelets: 313 10*3/uL (ref 150.0–400.0)
RBC: 3.97 Mil/uL (ref 3.87–5.11)
RDW: 14.4 % (ref 11.5–15.5)
WBC: 7.9 10*3/uL (ref 4.0–10.5)

## 2022-03-02 LAB — LIPASE: Lipase: 22 U/L (ref 11.0–59.0)

## 2022-03-02 MED ORDER — SUCRALFATE 1 G PO TABS: 1.0000 g | ORAL_TABLET | Freq: Three times a day (TID) | ORAL | 0 refills | Status: DC

## 2022-03-02 NOTE — Patient Instructions (Signed)
_______________________________________________________  If you are age 74 or older, your body mass index should be between 23-30. Your Body mass index is 27.98 kg/m. If this is out of the aforementioned range listed, please consider follow up with your Primary Care Provider.  If you are age 43 or younger, your body mass index should be between 19-25. Your Body mass index is 27.98 kg/m. If this is out of the aformentioned range listed, please consider follow up with your Primary Care Provider.   ________________________________________________________  The [facility] providers would like to encourage you to use [facility] to communicate with providers for non-urgent requests or questions.  Due to long hold times on the telephone, sending your provider a message by [facility] may be a faster and more efficient way to get a response.  Please allow 48 business hours for a response.  Please remember that this is for non-urgent requests.  _______________________________________________________  Your provider has requested that you go to the basement level for lab work before leaving today. Press "B" on the elevator. The lab is located at the first door on the left as you exit the elevator.  We have sent the following medications to your pharmacy for you to pick up at your convenience: [place] have been scheduled for an abdominal ultrasound at [facility] (1st floor of hospital) on 03-09-2022 at 10am. Please arrive 30 minutes prior to your appointment for registration. Make certain not to have anything to eat or drink midnight prior to your appointment. Should you need to reschedule your appointment, please contact radiology at [phone]. This test typically takes about 30 minutes to perform.  Thank you,  Dr. Jackquline Denmark

## 2022-03-02 NOTE — Progress Notes (Signed)
Chief Complaint: epi pain  Referring Provider:  Ronita Hipps, MD      ASSESSMENT AND PLAN;   #1. Epi pain. S/P lap chole in past (d/t stones). Neg EGD 09/2020 except for small HH  #2. GERD with small HH on EGD 09/2020  Plan:  -[place] abdo complete -CBC, CMP, lipase. -Continue Protonix 40mg  po QD -Carafate 1g po QID x 2 weeks -If still, GES.   HPI:    Brandy Cruz is a 72 y.o. female   C/O epi pain x 4-5 months, mostly after eating.  Getting somewhat worse.  She has been on Carafate for several years.  She now takes it on as-needed basis.  She also takes Protonix on as needed basis.  She denies having any odynophagia or dysphagia.  No definite food intolerance except milk which causes her to have more bloating.  She denies having any fever chills or night sweats no recent melena or hematochezia.  Rare nausea but no vomiting.  Carafate does help in addition to Protonix  She continues to be on high-fiber diet.  No further episodes of acute diverticulitis.  S/P chole in past.  S/p R Hip replacement recently  No sodas, chocolates, chewing gums, artificial sweeteners and candy. No NSAIDs    Past GI procedures:  EGD 10/21/2020 - Small hiatal hernia. - Gastritis. Bx-negative for H. pylori - A single gastric polyp. Resected and retrieved. Bx-fundic gland polyp. - Normal examined duodenum. Bx-negative for celiac disease  Colonoscopy 05/2020 - Moderate sigmoid diverticulosis. - Non-bleeding internal hemorrhoids. - The examined portion of the ileum was normal. - The examination was otherwise normal on direct and retroflexion views. - No need to repeat unless problems.  CT AP 04/2020 1. Findings suggestive of acute sigmoid diverticulitis. Given the slightly asymmetric appearance of colonic wall thickening at this location, underlying colonic malignancy is not excluded. Recommend colonoscopy following resolution of patient's acute symptoms. 2. Circumferential rectal wall  thickening, which may represent proctitis. 3. Aortic atherosclerosis (ICD10-I70.0).     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 Internal hemorrhoids. Otherwise normal colonoscopy to terminal ileum   DILATION AND CURETTAGE OF UTERUS     ESOPHAGOGASTRODUODENOSCOPY  2001   TOTAL HIP ARTHROPLASTY Left 03/05/2018   Procedure: TOTAL HIP ARTHROPLASTY ANTERIOR APPROACH;  Surgeon: Gaynelle Arabian, MD;  Location: [facility];  Service: Orthopedics;  Laterality: Left;   TOTAL HIP ARTHROPLASTY Right 10/02/2021   Procedure: TOTAL HIP ARTHROPLASTY ANTERIOR APPROACH;  Surgeon: Gaynelle Arabian, MD;  Location: [facility];  Service: Orthopedics;  Laterality: Right;   TOTAL KNEE ARTHROPLASTY Left 10/18/2014   Procedure: LEFT TOTAL KNEE ARTHROPLASTY;  Surgeon: Gaynelle Arabian, MD;  Location: [facility];  Service: Orthopedics;  Laterality: Left;    Family History  Problem Relation Age of Onset   Arthritis Mother    Prostate cancer Father    Colon cancer Neg Hx    Stomach cancer Neg Hx    Pancreatic cancer Neg Hx    Esophageal cancer Neg Hx    Liver disease Neg Hx     Social History   Tobacco Use   Smoking status: Never   Smokeless tobacco: Never  Vaping Use   Vaping Use: Never used  Substance Use Topics    Alcohol use: Not Currently    Comment: occasionally   Drug use: No    Current Outpatient Medications  Medication Sig Dispense Refill   allopurinol (ZYLOPRIM) 100 MG tablet Take 100 mg by mouth daily.     amLODipine (NORVASC) 5 MG tablet Take 5 mg by mouth daily.     pantoprazole (PROTONIX) 40 MG tablet TAKE 1 TABLET BY MOUTH EVERY DAY (Patient taking differently: Take 40 mg by mouth daily as needed (heartburn).) 90 tablet 3   potassium chloride (KLOR-CON) 10 MEQ tablet Take 10 mEq by mouth daily.     sucralfate (CARAFATE) 1 g tablet Take 1 g by mouth 4 (four) times daily -  with meals and at bedtime.     valsartan-hydrochlorothiazide (DIOVAN-HCT) 320-12.5 MG per tablet Take 1 tablet by mouth every morning.  12   venlafaxine XR (EFFEXOR-XR) 37.5 MG 24 hr capsule Take 1 capsule by mouth daily.     acetaminophen (TYLENOL) 500 MG tablet Take 1,000 mg by mouth every 6 (six) hours as needed for moderate pain.     HYDROcodone-acetaminophen (NORCO/VICODIN) 5-325 MG tablet Take 1-2 tablets by mouth every 6 (six) hours as needed for moderate pain or severe pain. 42 tablet 0   methocarbamol (ROBAXIN) 500 MG tablet Take 1 tablet (500 mg total) by mouth every 6 (six) hours as needed for muscle spasms. 40 tablet 0   No current facility-administered medications for this visit.    Allergies  Allergen Reactions   Sulfa Antibiotics Hives and Itching    + fever   Levaquin [Levofloxacin In D5w]     Dizziness and headache   Penicillins Rash    Childhood allergy.  Tolerated Cephalosporin Date: 10/03/21.      Review of Systems:  neg     Physical Exam:    BP (!) 140/76   Pulse 80   Ht 5\' 4"  (1.626 m)   Wt 163 lb (73.9 kg)   BMI 27.98 kg/m  Wt Readings from Last 3 Encounters:  03/02/22 163 lb (73.9 kg)  10/02/21 157 lb (71.2 kg)  09/22/21 157 lb (71.2 kg)   Constitutional:  Well-developed, in no acute distress. Psychiatric: Normal mood and affect. Behavior is normal. HEENT: Pupils  normal.  Conjunctivae are normal. No scleral icterus. Neck supple.  Cardiovascular: Normal rate, regular rhythm. No edema Pulmonary/chest: Effort normal and breath sounds normal. No wheezing, rales or rhonchi. Abdominal: Soft, nondistended.  Mild epi tenderness. Bowel sounds active throughout. There are no masses palpable. No hepatomegaly. Rectal: Deferred Neurological: Alert and oriented to person place and time. Skin: Skin is warm and dry. No rashes noted.    Carmell Austria, MD 03/02/2022, 4:06 PM  Cc: Ronita Hipps, MD

## 2022-03-05 ENCOUNTER — Other Ambulatory Visit: Payer: Self-pay

## 2022-03-05 MED ORDER — POTASSIUM CHLORIDE CRYS ER 20 MEQ PO TBCR
20.0000 meq | EXTENDED_RELEASE_TABLET | Freq: Every day | ORAL | 0 refills | Status: DC
Start: 2022-03-05 — End: 2024-01-02

## 2022-03-09 ENCOUNTER — Ambulatory Visit (HOSPITAL_COMMUNITY): Admission: RE | Admit: 2022-03-09 | Payer: Medicare PPO | Source: Ambulatory Visit

## 2022-03-09 ENCOUNTER — Encounter (HOSPITAL_COMMUNITY): Payer: Self-pay

## 2022-03-12 DIAGNOSIS — Z6826 Body mass index (BMI) 26.0-26.9, adult: Secondary | ICD-10-CM | POA: Diagnosis not present

## 2022-03-12 DIAGNOSIS — I10 Essential (primary) hypertension: Secondary | ICD-10-CM | POA: Diagnosis not present

## 2022-03-12 DIAGNOSIS — M109 Gout, unspecified: Secondary | ICD-10-CM | POA: Diagnosis not present

## 2022-03-12 DIAGNOSIS — D519 Vitamin B12 deficiency anemia, unspecified: Secondary | ICD-10-CM | POA: Diagnosis not present

## 2022-03-12 DIAGNOSIS — E559 Vitamin D deficiency, unspecified: Secondary | ICD-10-CM | POA: Diagnosis not present

## 2022-03-12 DIAGNOSIS — Z79899 Other long term (current) drug therapy: Secondary | ICD-10-CM | POA: Diagnosis not present

## 2022-03-12 DIAGNOSIS — E785 Hyperlipidemia, unspecified: Secondary | ICD-10-CM | POA: Diagnosis not present

## 2022-03-12 DIAGNOSIS — M8580 Other specified disorders of bone density and structure, unspecified site: Secondary | ICD-10-CM | POA: Diagnosis not present

## 2022-03-13 ENCOUNTER — Ambulatory Visit (HOSPITAL_COMMUNITY)
Admission: RE | Admit: 2022-03-13 | Discharge: 2022-03-13 | Disposition: A | Discharge status: Home / Self Care | Payer: Medicare PPO | Source: Ambulatory Visit | Attending: Gastroenterology | Admitting: Gastroenterology

## 2022-03-13 DIAGNOSIS — R1013 Epigastric pain: Secondary | ICD-10-CM | POA: Diagnosis not present

## 2022-03-13 DIAGNOSIS — K760 Fatty (change of) liver, not elsewhere classified: Secondary | ICD-10-CM | POA: Diagnosis not present

## 2022-03-13 DIAGNOSIS — K219 Gastro-esophageal reflux disease without esophagitis: Secondary | ICD-10-CM | POA: Insufficient documentation

## 2022-03-13 DIAGNOSIS — K449 Diaphragmatic hernia without obstruction or gangrene: Secondary | ICD-10-CM

## 2022-04-10 DIAGNOSIS — D519 Vitamin B12 deficiency anemia, unspecified: Secondary | ICD-10-CM | POA: Diagnosis not present

## 2022-04-24 DIAGNOSIS — L821 Other seborrheic keratosis: Secondary | ICD-10-CM | POA: Diagnosis not present

## 2022-04-24 DIAGNOSIS — L814 Other melanin hyperpigmentation: Secondary | ICD-10-CM | POA: Diagnosis not present

## 2022-04-24 DIAGNOSIS — D225 Melanocytic nevi of trunk: Secondary | ICD-10-CM | POA: Diagnosis not present

## 2022-04-24 DIAGNOSIS — D485 Neoplasm of uncertain behavior of skin: Secondary | ICD-10-CM | POA: Diagnosis not present

## 2022-04-24 DIAGNOSIS — L820 Inflamed seborrheic keratosis: Secondary | ICD-10-CM | POA: Diagnosis not present

## 2022-04-24 DIAGNOSIS — D2239 Melanocytic nevi of other parts of face: Secondary | ICD-10-CM | POA: Diagnosis not present

## 2022-05-09 DIAGNOSIS — M170 Bilateral primary osteoarthritis of knee: Secondary | ICD-10-CM | POA: Diagnosis not present

## 2022-05-09 DIAGNOSIS — M1711 Unilateral primary osteoarthritis, right knee: Secondary | ICD-10-CM | POA: Diagnosis not present

## 2022-05-14 DIAGNOSIS — D519 Vitamin B12 deficiency anemia, unspecified: Secondary | ICD-10-CM | POA: Diagnosis not present

## 2022-06-06 DIAGNOSIS — Z6829 Body mass index (BMI) 29.0-29.9, adult: Secondary | ICD-10-CM | POA: Diagnosis not present

## 2022-06-06 DIAGNOSIS — Z01419 Encounter for gynecological examination (general) (routine) without abnormal findings: Secondary | ICD-10-CM | POA: Diagnosis not present

## 2022-06-06 DIAGNOSIS — Z1231 Encounter for screening mammogram for malignant neoplasm of breast: Secondary | ICD-10-CM | POA: Diagnosis not present

## 2022-06-18 DIAGNOSIS — D519 Vitamin B12 deficiency anemia, unspecified: Secondary | ICD-10-CM | POA: Diagnosis not present

## 2022-07-03 DIAGNOSIS — M8580 Other specified disorders of bone density and structure, unspecified site: Secondary | ICD-10-CM | POA: Diagnosis not present

## 2022-07-03 DIAGNOSIS — Z0000 Encounter for general adult medical examination without abnormal findings: Secondary | ICD-10-CM | POA: Diagnosis not present

## 2022-07-03 DIAGNOSIS — E785 Hyperlipidemia, unspecified: Secondary | ICD-10-CM | POA: Diagnosis not present

## 2022-07-03 DIAGNOSIS — E559 Vitamin D deficiency, unspecified: Secondary | ICD-10-CM | POA: Diagnosis not present

## 2022-07-06 DIAGNOSIS — Z0000 Encounter for general adult medical examination without abnormal findings: Secondary | ICD-10-CM | POA: Diagnosis not present

## 2022-07-06 DIAGNOSIS — Z6826 Body mass index (BMI) 26.0-26.9, adult: Secondary | ICD-10-CM | POA: Diagnosis not present

## 2022-07-06 DIAGNOSIS — E785 Hyperlipidemia, unspecified: Secondary | ICD-10-CM | POA: Diagnosis not present

## 2022-07-06 DIAGNOSIS — M8589 Other specified disorders of bone density and structure, multiple sites: Secondary | ICD-10-CM | POA: Diagnosis not present

## 2022-07-06 DIAGNOSIS — M8580 Other specified disorders of bone density and structure, unspecified site: Secondary | ICD-10-CM | POA: Diagnosis not present

## 2022-07-06 DIAGNOSIS — Z1339 Encounter for screening examination for other mental health and behavioral disorders: Secondary | ICD-10-CM | POA: Diagnosis not present

## 2022-07-12 DIAGNOSIS — E538 Deficiency of other specified B group vitamins: Secondary | ICD-10-CM | POA: Diagnosis not present

## 2022-07-30 DIAGNOSIS — L821 Other seborrheic keratosis: Secondary | ICD-10-CM | POA: Diagnosis not present

## 2022-07-30 DIAGNOSIS — L570 Actinic keratosis: Secondary | ICD-10-CM | POA: Diagnosis not present

## 2022-07-30 DIAGNOSIS — L820 Inflamed seborrheic keratosis: Secondary | ICD-10-CM | POA: Diagnosis not present

## 2022-07-30 DIAGNOSIS — L578 Other skin changes due to chronic exposure to nonionizing radiation: Secondary | ICD-10-CM | POA: Diagnosis not present

## 2022-08-03 DIAGNOSIS — M25561 Pain in right knee: Secondary | ICD-10-CM | POA: Diagnosis not present

## 2022-08-03 DIAGNOSIS — Z96641 Presence of right artificial hip joint: Secondary | ICD-10-CM | POA: Diagnosis not present

## 2022-08-21 DIAGNOSIS — Z6827 Body mass index (BMI) 27.0-27.9, adult: Secondary | ICD-10-CM | POA: Diagnosis not present

## 2022-08-21 DIAGNOSIS — M778 Other enthesopathies, not elsewhere classified: Secondary | ICD-10-CM | POA: Diagnosis not present

## 2022-08-21 DIAGNOSIS — E538 Deficiency of other specified B group vitamins: Secondary | ICD-10-CM | POA: Diagnosis not present

## 2022-09-12 DIAGNOSIS — Z6827 Body mass index (BMI) 27.0-27.9, adult: Secondary | ICD-10-CM | POA: Diagnosis not present

## 2022-09-12 DIAGNOSIS — R300 Dysuria: Secondary | ICD-10-CM | POA: Diagnosis not present

## 2022-09-12 DIAGNOSIS — J329 Chronic sinusitis, unspecified: Secondary | ICD-10-CM | POA: Diagnosis not present

## 2022-09-17 ENCOUNTER — Ambulatory Visit: Payer: Medicare PPO | Attending: Cardiology | Admitting: Cardiology

## 2022-09-17 ENCOUNTER — Encounter: Payer: Self-pay | Admitting: Cardiology

## 2022-09-17 VITALS — BP 148/80 | HR 84 | Ht 63.5 in | Wt 163.0 lb

## 2022-09-17 DIAGNOSIS — E785 Hyperlipidemia, unspecified: Secondary | ICD-10-CM

## 2022-09-17 DIAGNOSIS — M79672 Pain in left foot: Secondary | ICD-10-CM | POA: Diagnosis not present

## 2022-09-17 DIAGNOSIS — I10 Essential (primary) hypertension: Secondary | ICD-10-CM | POA: Diagnosis not present

## 2022-09-17 DIAGNOSIS — R0789 Other chest pain: Secondary | ICD-10-CM

## 2022-09-17 DIAGNOSIS — D510 Vitamin B12 deficiency anemia due to intrinsic factor deficiency: Secondary | ICD-10-CM | POA: Diagnosis not present

## 2022-09-17 DIAGNOSIS — M160 Bilateral primary osteoarthritis of hip: Secondary | ICD-10-CM

## 2022-09-17 DIAGNOSIS — Z6827 Body mass index (BMI) 27.0-27.9, adult: Secondary | ICD-10-CM | POA: Diagnosis not present

## 2022-09-17 NOTE — Patient Instructions (Signed)
Medication Instructions:  Your physician recommends that you continue on your current medications as directed. Please refer to the Current Medication list given to you today.  *If you need a refill on your cardiac medications before your next appointment, please call your pharmacy*   Lab Work: None ordered If you have labs (blood work) drawn today and your tests are completely normal, you will receive your results only by: [place] (if you have MyChart) OR A paper copy in the mail If you have any lab test that is abnormal or we need to change your treatment, we will call you to review the results.   Testing/Procedures: None ordered   Follow-Up: At [facility], you and your health needs are our priority.  As part of our continuing mission to provide you with exceptional heart care, we have created designated Provider Care Teams.  These Care Teams include your primary Cardiologist (physician) and Advanced Practice Providers (APPs -  Physician Assistants and Nurse Practitioners) who all work together to provide you with the care you need, when you need it.  We recommend signing up for the patient portal called "MyChart".  Sign up information is provided on this After Visit Summary.  MyChart is used to connect with patients for Virtual Visits (Telemedicine).  Patients are able to view lab/test results, encounter notes, upcoming appointments, etc.  Non-urgent messages can be sent to your provider as well.   To learn more about what you can do with MyChart, go to [email or web address].    Your next appointment:   12 month(s)  The format for your next appointment:   In Person  Provider:   Jenne Campus, MD    Other Instructions none  Important Information About Sugar

## 2022-09-17 NOTE — Progress Notes (Signed)
Cardiology Office Note:    Date:  09/17/2022   ID:  Brandy Cruz, Brandy Cruz [birth year], MRN [id number]  PCP:  Marylen Ponto, MD  Cardiologist:  Gypsy Balsam, MD    Referring MD: Marylen Ponto, MD   Chief Complaint  Patient presents with   Medication Management    Statin intolerance, want to discuss options    History of Present Illness:    Brandy Cruz is a 72 y.o. female past medical history significant for atypical chest pain, she did have a stress test which was normal she still has some atypical symptoms eventually end up having coronary CT angio done which was basically normal calcium score 0 no evidence of obstructive disease.  Last time I seen her was a year ago when she was getting ready to have her hip surgery she did very well with surgery doing well happy denies have any chest pain tightness squeezing pressure burning chest.  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 Internal hemorrhoids. Otherwise normal colonoscopy to terminal ileum   DILATION AND CURETTAGE OF UTERUS     ESOPHAGOGASTRODUODENOSCOPY  2001   TOTAL HIP ARTHROPLASTY Left 03/05/2018   Procedure: TOTAL HIP ARTHROPLASTY ANTERIOR APPROACH;  Surgeon: Ollen Gross, MD;  Location: [facility];  Service: Orthopedics;   Laterality: Left;   TOTAL HIP ARTHROPLASTY Right 10/02/2021   Procedure: TOTAL HIP ARTHROPLASTY ANTERIOR APPROACH;  Surgeon: Ollen Gross, MD;  Location: [facility];  Service: Orthopedics;  Laterality: Right;   TOTAL KNEE ARTHROPLASTY Left 10/18/2014   Procedure: LEFT TOTAL KNEE ARTHROPLASTY;  Surgeon: Ollen Gross, MD;  Location: [facility];  Service: Orthopedics;  Laterality: Left;    Current Medications: Current Meds  Medication Sig   allopurinol (ZYLOPRIM) 100 MG tablet Take 100 mg by mouth daily.   amLODipine (NORVASC) 5 MG tablet Take 5 mg by mouth daily.   DOXYCYCLINE HYCLATE PO Take 1 tablet by mouth 2 (two) times daily. Has 3 days remaining   pantoprazole (PROTONIX) 40 MG tablet TAKE 1 TABLET BY MOUTH EVERY DAY (Patient taking differently: Take 40 mg by mouth daily as needed (heartburn).)   potassium chloride SA (KLOR-CON M) 20 MEQ tablet Take 1 tablet (20 mEq total) by mouth daily. Additional refills will need to come from your Primary Care Provider. No more refills given by our office   sucralfate (CARAFATE) 1 g tablet Take 1 tablet (1 g total) by mouth 4 (four) times daily -  with meals and at bedtime. For 14 days   valsartan-hydrochlorothiazide (DIOVAN-HCT) 320-12.5 MG per tablet Take 1 tablet by mouth every morning.   venlafaxine XR (EFFEXOR-XR) 37.5 MG 24 hr  capsule Take 1 capsule by mouth daily.   VITAMIN D, CHOLECALCIFEROL, PO Take 1 tablet by mouth 2 (two) times a week.   [DISCONTINUED] acetaminophen (TYLENOL) 500 MG tablet Take 1,000 mg by mouth every 6 (six) hours as needed for moderate pain.   [DISCONTINUED] HYDROcodone-acetaminophen (NORCO/VICODIN) 5-325 MG tablet Take 1-2 tablets by mouth every 6 (six) hours as needed for moderate pain or severe pain.   [DISCONTINUED] methocarbamol (ROBAXIN) 500 MG tablet Take 1 tablet (500 mg total) by mouth every 6 (six) hours as needed for muscle spasms.   [DISCONTINUED] potassium chloride (KLOR-CON) 10 MEQ tablet Take 10 mEq by mouth daily.      Allergies:   Sulfa antibiotics, Levaquin [levofloxacin in d5w], and Penicillins   Social History   Socioeconomic History   Marital status: Married    Spouse name: Not on file   Number of children: 3   Years of education: Not on file   Highest education level: Not on file  Occupational History   Occupation: Retired  Tobacco Use   Smoking status: Never   Smokeless tobacco: Never  Vaping Use   Vaping status: Never Used  Substance and Sexual Activity   Alcohol use: Not Currently    Comment: occasionally   Drug use: No   Sexual activity: Not Currently  Other Topics Concern   Not on file  Social History Narrative   Not on file   Social Determinants of Health   Financial Resource Strain: Not on file  Food Insecurity: Not on file  Transportation Needs: Not on file  Physical Activity: Not on file  Stress: Not on file  Social Connections: Not on file     Family History: The patient's family history includes Arthritis in her mother; Prostate cancer in her father. There is no history of Colon cancer, Stomach cancer, Pancreatic cancer, Esophageal cancer, or Liver disease. ROS:   Please see the history of present illness.    All 14 point review of systems negative except as described per history of present illness  EKGs/Labs/Other Studies Reviewed:    EKG Interpretation Date/Time:  Monday September 17 2022 09:00:25 EDT Ventricular Rate:  78 PR Interval:  176 QRS Duration:  80 QT Interval:  352 QTC Calculation: 401 R Axis:   25  Text Interpretation: Normal sinus rhythm Cannot rule out Anterior infarct (cited on or before 24-Feb-2018) Abnormal ECG When compared with ECG of 21-Sep-2021 13:26, Criteria for Inferior infarct are no longer Present Questionable change in initial forces of Anterior leads Non-specific change in ST segment in Inferior leads Nonspecific T wave abnormality, worse in Inferior leads Nonspecific T wave abnormality, improved in Lateral leads Confirmed by  Gypsy Balsam [phone]) on 09/17/2022 9:20:35 AM    Recent Labs: 03/02/2022: ALT 15; BUN 9; Creatinine, Ser 0.64; Hemoglobin 12.9; Platelets 313.0; Potassium 3.1; Sodium 137  Recent Lipid Panel    Component Value Date/Time   CHOL 130 03/11/2020 0900   TRIG 127 03/11/2020 0900   HDL 53 03/11/2020 0900   CHOLHDL 2.5 03/11/2020 0900   LDLCALC 55 03/11/2020 0900    Physical Exam:    VS:  BP (!) 148/80 (BP Location: Left Arm, Patient Position: Sitting)   Pulse 84   Ht 5' 3.5" (1.613 m)   Wt 163 lb (73.9 kg)   SpO2 96%   BMI 28.42 kg/m     Wt Readings from Last 3 Encounters:  09/17/22 163 lb (73.9 kg)  03/02/22 163 lb (73.9 kg)  10/02/21 157 lb (  71.2 kg)     GEN:  Well nourished, well developed in no acute distress HEENT: Normal NECK: No JVD; No carotid bruits LYMPHATICS: No lymphadenopathy CARDIAC: RRR, no murmurs, no rubs, no gallops RESPIRATORY:  Clear to auscultation without rales, wheezing or rhonchi  ABDOMEN: Soft, non-tender, non-distended MUSCULOSKELETAL:  No edema; No deformity  SKIN: Warm and dry LOWER EXTREMITIES: no swelling NEUROLOGIC:  Alert and oriented x 3 PSYCHIATRIC:  Normal affect   ASSESSMENT:    1. Atypical chest pain   2. Dyslipidemia   3. Primary hypertension   4. Primary osteoarthritis of both hips    PLAN:    In order of problems listed above:  Atypical chest pain: Doing well from that point review.  Her coronary CT angio was normal.  Continue monitoring. Dyslipidemia does have an total cholesterol of 221 with HDL 48 this is from May 7.  She was put initially on pravastatin cannot tolerate now she got a prescription for lovastatin she wanted my opinion if she should started my PNES if she cannot tolerate it then we have options on Zetia or option of Crestor may be 2.5 mg 3 times a week. Essential hypertension will be elevated we will check before she leaves the room.  Osteoarthritis of hip status post both hip replacement as well as left  knee.  Very happy with satisfied with the results   Medication Adjustments/Labs and Tests Ordered: Current medicines are reviewed at length with the patient today.  Concerns regarding medicines are outlined above.  Orders Placed This Encounter  Procedures   EKG 12-Lead   Medication changes: No orders of the defined types were placed in this encounter.   Signed, Georgeanna Lea, MD, [facility] 09/17/2022 9:28 AM    [facility]

## 2022-10-13 IMAGING — CT CT ABD-PELV W/ CM
2 of 5 series · 15 of 46 positions shown, 17 images · IV contrast (OMNIPAQUE 300)
Comparison: CT 02/20/2007

CLINICAL DATA: Left lower quadrant abdominal pain

EXAM:
CT ABDOMEN AND PELVIS WITH CONTRAST
TECHNIQUE: Multidetector CT imaging of the abdomen and pelvis was performed
using the standard protocol following bolus administration of
intravenous contrast.
CONTRAST:  100mL OMNIPAQUE IOHEXOL 300 MG/ML  SOLN

[Series 2: abd/pel w · axial · 0.71mm/px · z∈[-441,-56]mm · 12 of 87 slices shown, 14 images]
[im 5/87  soft-tissue]
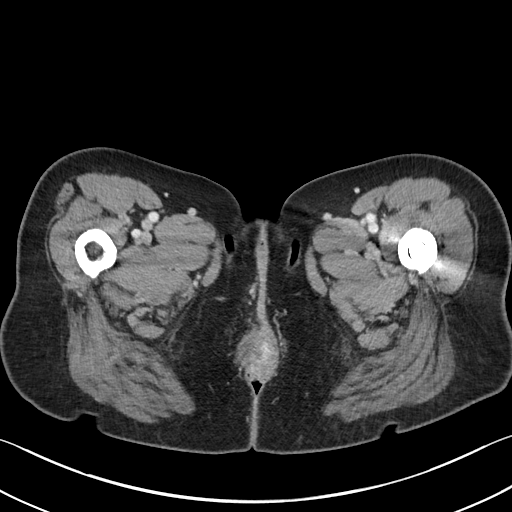
[im 5/87  bone]
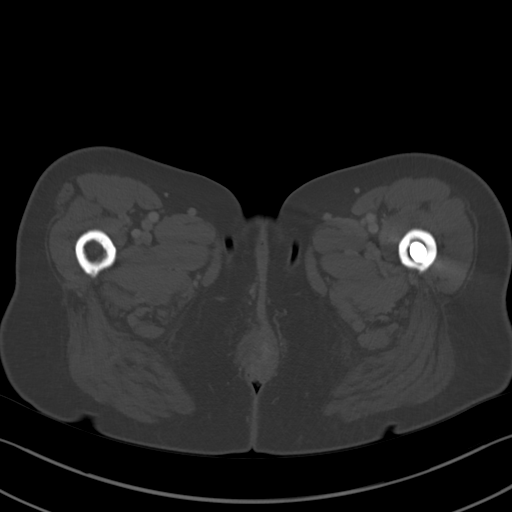
[im 15/87  soft-tissue]
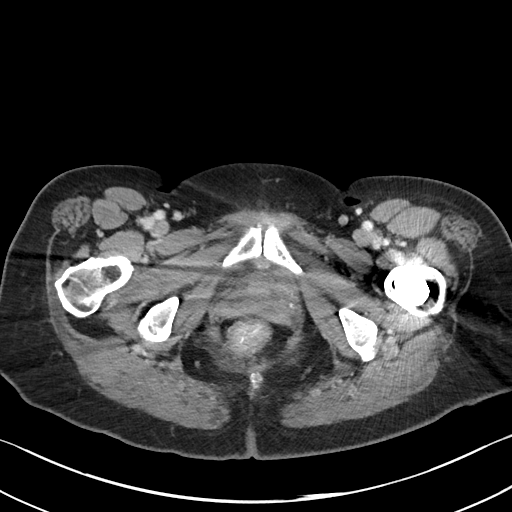
[im 20/87  soft-tissue]
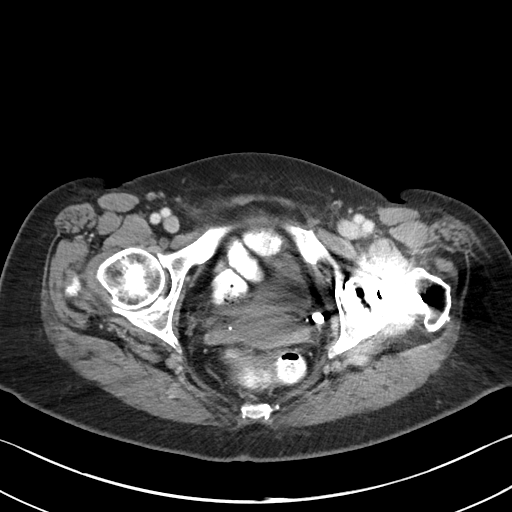
[im 24/87  soft-tissue]
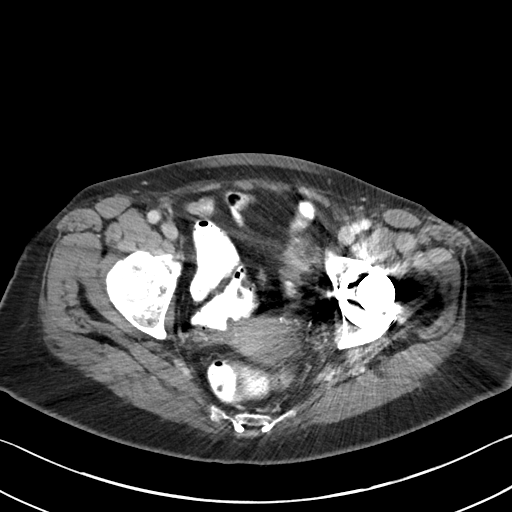
[im 34/87  soft-tissue]
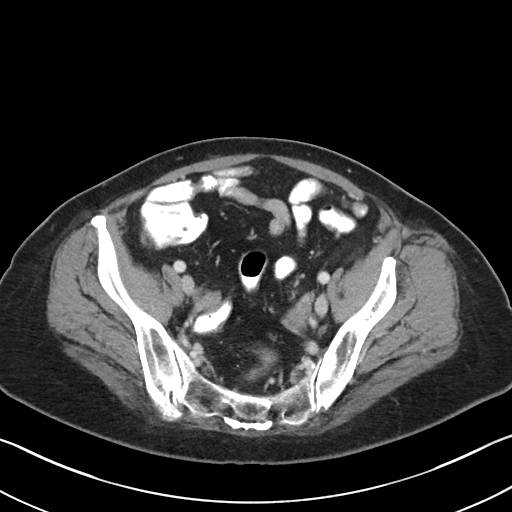
[im 39/87  soft-tissue]
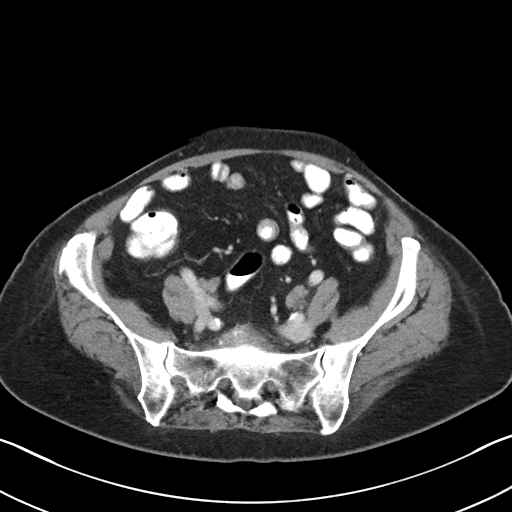
[im 48/87  soft-tissue]
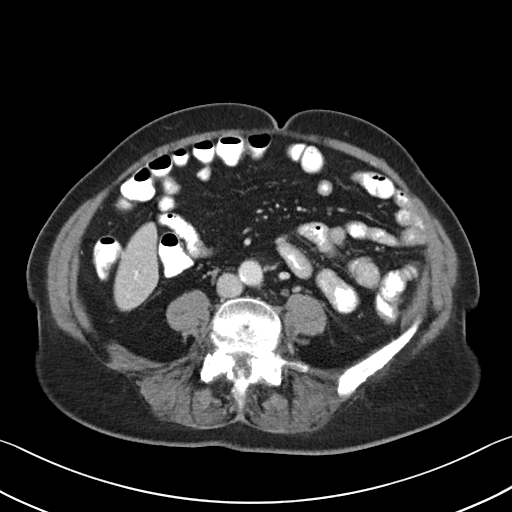
[im 53/87  soft-tissue]
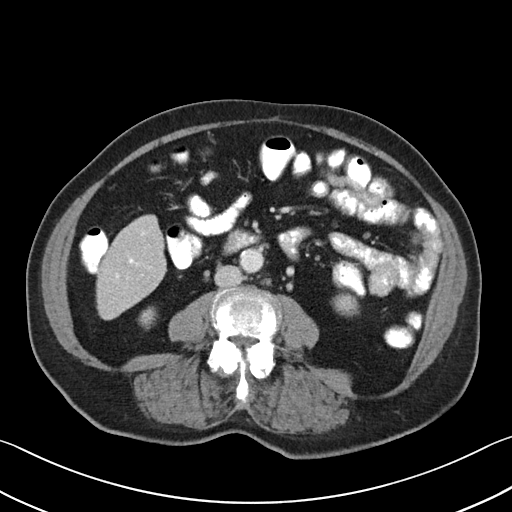
[im 63/87  soft-tissue]
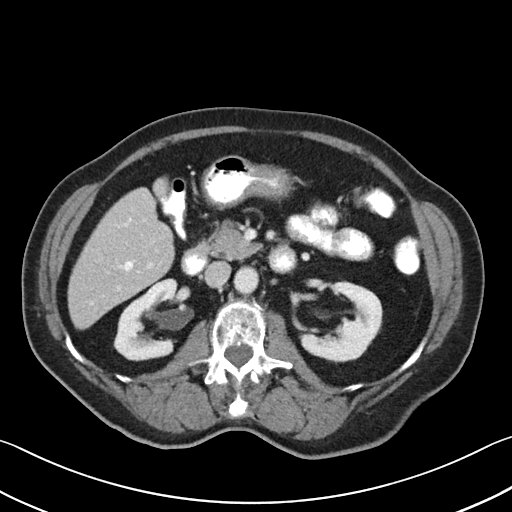
[im 63/87  bone]
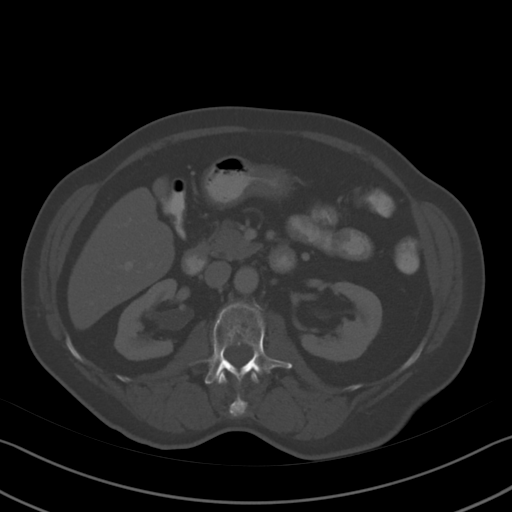
[im 67/87  soft-tissue]
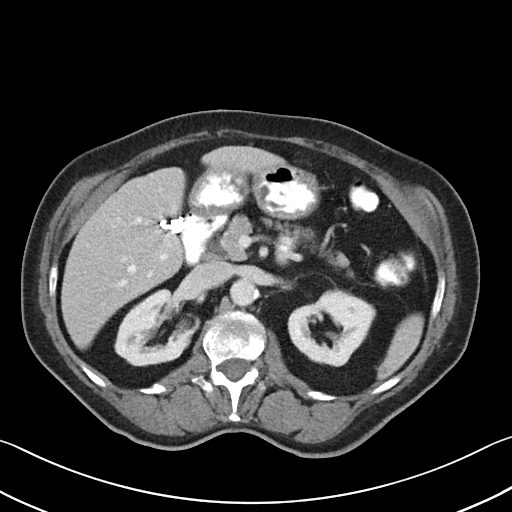
[im 72/87  soft-tissue]
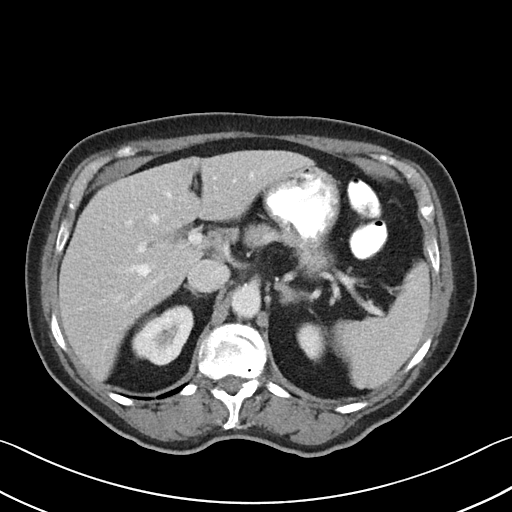
[im 82/87  soft-tissue]
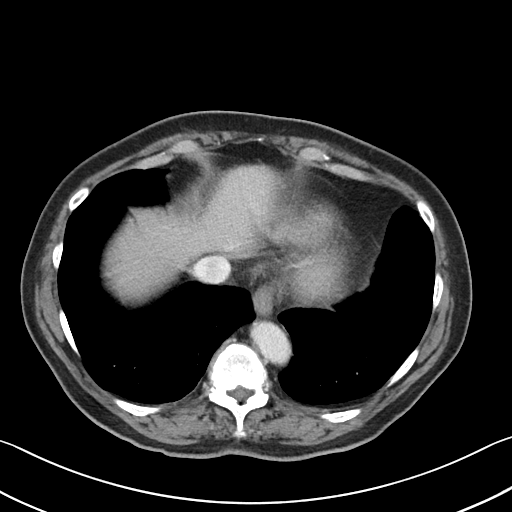

[Series 5: coronal st · coronal · 0.67mm/px · 3 of 76 slices shown]
[im 26/76  soft-tissue]
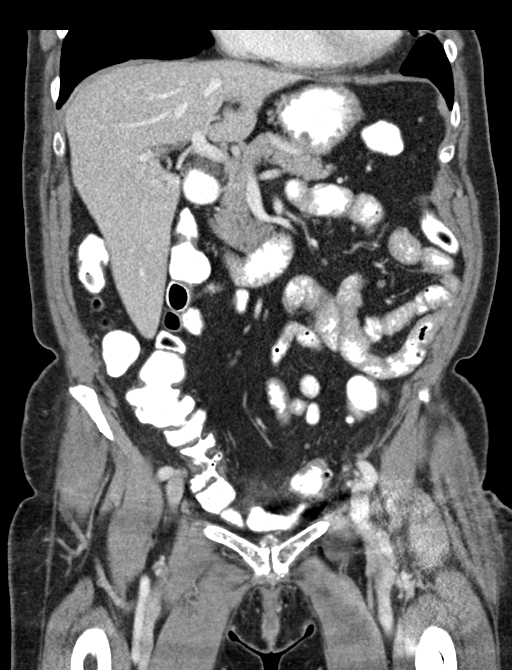
[im 34/76  soft-tissue]
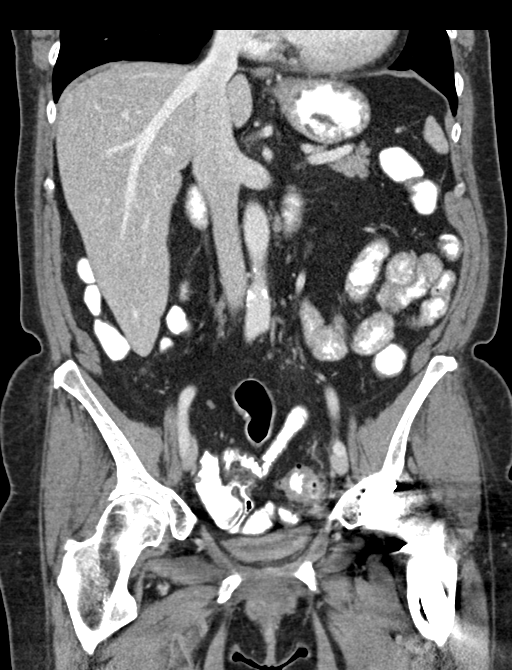
[im 42/76  soft-tissue]
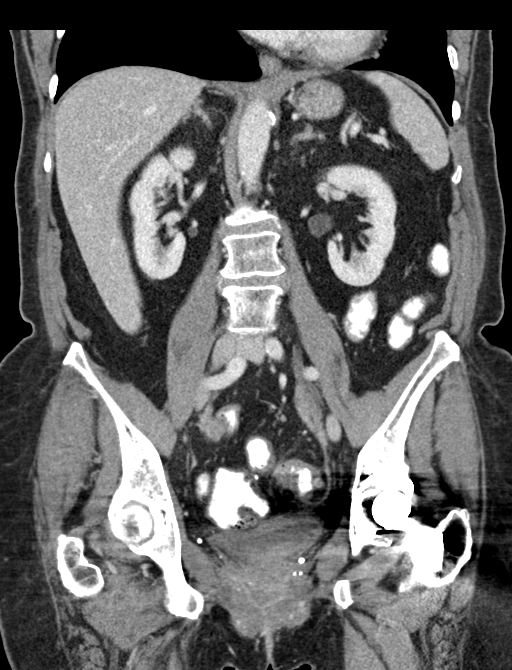

[15 of 46 positions shown; findings below may reference images not displayed]

FINDINGS: Lower chest: Included lung bases are clear. Heart size within normal
limits.

Hepatobiliary: Simple 2.0 cm cyst at the left hepatic dome. Few
additional scattered low-density lesions, too small to definitively
characterize, and may represent additional cysts. Prior
cholecystectomy. No biliary dilatation.

Pancreas: Unremarkable. No pancreatic ductal dilatation or
surrounding inflammatory changes.

Spleen: Normal in size without focal abnormality.

Adrenals/Urinary Tract: Unremarkable adrenal glands. Kidneys enhance
symmetrically. Bilateral extrarenal pelvises unchanged from prior.
No renal stone or hydronephrosis. Urinary bladder is decompressed,
limiting its evaluation.

Stomach/Bowel: Bowel is well distended with oral contrast. Short
segment area of slightly asymmetric bowel wall thickening within the
proximal to mid sigmoid colon containing numerous diverticula
(series 2, image 62). Mild adjacent fat stranding. No adjacent fluid
collection. No pneumoperitoneum. Circumferential rectal wall
thickening. Stomach within normal limits. No dilated loops of small
bowel.

Vascular/Lymphatic: Scattered aortoiliac atherosclerotic
calcifications without aneurysm. No abdominopelvic lymphadenopathy.

Reproductive: Uterus and bilateral adnexa are unremarkable.

Other: No ascites. No abdominopelvic fluid collection. No
pneumoperitoneum. No abdominal wall hernia.

Musculoskeletal: Prior left hip arthroplasty with associated
metallic streak artifact. Degenerative changes of the right hip and
lumbar spine. No acute osseous abnormality. Chronic superior
endplate compression deformity of L1, unchanged.
IMPRESSION: 1. Findings suggestive of acute sigmoid diverticulitis. Given the
slightly asymmetric appearance of colonic wall thickening at this
location, underlying colonic malignancy is not excluded. Recommend
colonoscopy following resolution of patient's acute symptoms.
2. Circumferential rectal wall thickening, which may represent
proctitis.
3. Aortic atherosclerosis (2T8UT-OD0.0).

These results will be called to the ordering clinician or
representative by the Radiologist Assistant, and communication
documented in the PACS or [REDACTED].

## 2022-10-22 DIAGNOSIS — E538 Deficiency of other specified B group vitamins: Secondary | ICD-10-CM | POA: Diagnosis not present

## 2022-11-28 DIAGNOSIS — E559 Vitamin D deficiency, unspecified: Secondary | ICD-10-CM | POA: Diagnosis not present

## 2022-11-28 DIAGNOSIS — E538 Deficiency of other specified B group vitamins: Secondary | ICD-10-CM | POA: Diagnosis not present

## 2022-11-28 DIAGNOSIS — E789 Disorder of lipoprotein metabolism, unspecified: Secondary | ICD-10-CM | POA: Diagnosis not present

## 2022-11-28 DIAGNOSIS — E792 Myoadenylate deaminase deficiency: Secondary | ICD-10-CM | POA: Diagnosis not present

## 2022-12-04 DIAGNOSIS — Z23 Encounter for immunization: Secondary | ICD-10-CM | POA: Diagnosis not present

## 2022-12-04 DIAGNOSIS — M109 Gout, unspecified: Secondary | ICD-10-CM | POA: Diagnosis not present

## 2022-12-04 DIAGNOSIS — E785 Hyperlipidemia, unspecified: Secondary | ICD-10-CM | POA: Diagnosis not present

## 2022-12-04 DIAGNOSIS — Z6825 Body mass index (BMI) 25.0-25.9, adult: Secondary | ICD-10-CM | POA: Diagnosis not present

## 2022-12-04 DIAGNOSIS — I10 Essential (primary) hypertension: Secondary | ICD-10-CM | POA: Diagnosis not present

## 2022-12-07 DIAGNOSIS — H524 Presbyopia: Secondary | ICD-10-CM | POA: Diagnosis not present

## 2022-12-07 DIAGNOSIS — H18413 Arcus senilis, bilateral: Secondary | ICD-10-CM | POA: Diagnosis not present

## 2022-12-07 DIAGNOSIS — H26493 Other secondary cataract, bilateral: Secondary | ICD-10-CM | POA: Diagnosis not present

## 2022-12-07 DIAGNOSIS — Z961 Presence of intraocular lens: Secondary | ICD-10-CM | POA: Diagnosis not present

## 2022-12-07 DIAGNOSIS — H02831 Dermatochalasis of right upper eyelid: Secondary | ICD-10-CM | POA: Diagnosis not present

## 2022-12-07 DIAGNOSIS — H40013 Open angle with borderline findings, low risk, bilateral: Secondary | ICD-10-CM | POA: Diagnosis not present

## 2022-12-07 DIAGNOSIS — H02834 Dermatochalasis of left upper eyelid: Secondary | ICD-10-CM | POA: Diagnosis not present

## 2022-12-07 DIAGNOSIS — H26491 Other secondary cataract, right eye: Secondary | ICD-10-CM | POA: Diagnosis not present

## 2022-12-13 DIAGNOSIS — L820 Inflamed seborrheic keratosis: Secondary | ICD-10-CM | POA: Diagnosis not present

## 2022-12-13 DIAGNOSIS — D485 Neoplasm of uncertain behavior of skin: Secondary | ICD-10-CM | POA: Diagnosis not present

## 2022-12-17 DIAGNOSIS — M25561 Pain in right knee: Secondary | ICD-10-CM | POA: Diagnosis not present

## 2023-01-03 DIAGNOSIS — E538 Deficiency of other specified B group vitamins: Secondary | ICD-10-CM | POA: Diagnosis not present

## 2023-01-04 DIAGNOSIS — C44319 Basal cell carcinoma of skin of other parts of face: Secondary | ICD-10-CM | POA: Diagnosis not present

## 2023-02-07 DIAGNOSIS — E538 Deficiency of other specified B group vitamins: Secondary | ICD-10-CM | POA: Diagnosis not present

## 2023-02-21 DIAGNOSIS — L820 Inflamed seborrheic keratosis: Secondary | ICD-10-CM | POA: Diagnosis not present

## 2023-03-05 DIAGNOSIS — D72829 Elevated white blood cell count, unspecified: Secondary | ICD-10-CM | POA: Diagnosis not present

## 2023-03-05 DIAGNOSIS — I10 Essential (primary) hypertension: Secondary | ICD-10-CM | POA: Diagnosis not present

## 2023-03-05 DIAGNOSIS — I951 Orthostatic hypotension: Secondary | ICD-10-CM | POA: Diagnosis not present

## 2023-03-05 DIAGNOSIS — R55 Syncope and collapse: Secondary | ICD-10-CM | POA: Diagnosis not present

## 2023-03-05 DIAGNOSIS — J9383 Other pneumothorax: Secondary | ICD-10-CM | POA: Diagnosis not present

## 2023-03-05 DIAGNOSIS — S098XXA Other specified injuries of head, initial encounter: Secondary | ICD-10-CM | POA: Diagnosis not present

## 2023-03-05 DIAGNOSIS — R0989 Other specified symptoms and signs involving the circulatory and respiratory systems: Secondary | ICD-10-CM | POA: Diagnosis not present

## 2023-03-05 DIAGNOSIS — R935 Abnormal findings on diagnostic imaging of other abdominal regions, including retroperitoneum: Secondary | ICD-10-CM | POA: Diagnosis not present

## 2023-03-05 DIAGNOSIS — J939 Pneumothorax, unspecified: Secondary | ICD-10-CM | POA: Diagnosis not present

## 2023-03-05 DIAGNOSIS — W19XXXA Unspecified fall, initial encounter: Secondary | ICD-10-CM | POA: Diagnosis not present

## 2023-03-05 DIAGNOSIS — I440 Atrioventricular block, first degree: Secondary | ICD-10-CM | POA: Diagnosis not present

## 2023-03-05 DIAGNOSIS — Z7401 Bed confinement status: Secondary | ICD-10-CM | POA: Diagnosis not present

## 2023-03-05 DIAGNOSIS — S270XXA Traumatic pneumothorax, initial encounter: Secondary | ICD-10-CM | POA: Diagnosis not present

## 2023-03-05 DIAGNOSIS — S2241XA Multiple fractures of ribs, right side, initial encounter for closed fracture: Secondary | ICD-10-CM | POA: Diagnosis not present

## 2023-03-05 DIAGNOSIS — M546 Pain in thoracic spine: Secondary | ICD-10-CM | POA: Diagnosis not present

## 2023-03-15 DIAGNOSIS — Z6826 Body mass index (BMI) 26.0-26.9, adult: Secondary | ICD-10-CM | POA: Diagnosis not present

## 2023-03-15 DIAGNOSIS — I10 Essential (primary) hypertension: Secondary | ICD-10-CM | POA: Diagnosis not present

## 2023-03-15 DIAGNOSIS — S2241XD Multiple fractures of ribs, right side, subsequent encounter for fracture with routine healing: Secondary | ICD-10-CM | POA: Diagnosis not present

## 2023-03-28 DIAGNOSIS — E538 Deficiency of other specified B group vitamins: Secondary | ICD-10-CM | POA: Diagnosis not present

## 2023-04-02 DIAGNOSIS — S270XXD Traumatic pneumothorax, subsequent encounter: Secondary | ICD-10-CM | POA: Diagnosis not present

## 2023-04-02 DIAGNOSIS — S2241XD Multiple fractures of ribs, right side, subsequent encounter for fracture with routine healing: Secondary | ICD-10-CM | POA: Diagnosis not present

## 2023-05-09 DIAGNOSIS — M1711 Unilateral primary osteoarthritis, right knee: Secondary | ICD-10-CM | POA: Diagnosis not present

## 2023-05-15 DIAGNOSIS — L821 Other seborrheic keratosis: Secondary | ICD-10-CM | POA: Diagnosis not present

## 2023-05-15 DIAGNOSIS — L814 Other melanin hyperpigmentation: Secondary | ICD-10-CM | POA: Diagnosis not present

## 2023-05-15 DIAGNOSIS — D2239 Melanocytic nevi of other parts of face: Secondary | ICD-10-CM | POA: Diagnosis not present

## 2023-05-15 DIAGNOSIS — L820 Inflamed seborrheic keratosis: Secondary | ICD-10-CM | POA: Diagnosis not present

## 2023-05-15 DIAGNOSIS — D485 Neoplasm of uncertain behavior of skin: Secondary | ICD-10-CM | POA: Diagnosis not present

## 2023-05-15 DIAGNOSIS — M1711 Unilateral primary osteoarthritis, right knee: Secondary | ICD-10-CM | POA: Diagnosis not present

## 2023-05-15 DIAGNOSIS — D225 Melanocytic nevi of trunk: Secondary | ICD-10-CM | POA: Diagnosis not present

## 2023-05-23 DIAGNOSIS — M1711 Unilateral primary osteoarthritis, right knee: Secondary | ICD-10-CM | POA: Diagnosis not present

## 2023-05-31 DIAGNOSIS — E538 Deficiency of other specified B group vitamins: Secondary | ICD-10-CM | POA: Diagnosis not present

## 2023-06-10 DIAGNOSIS — Z1231 Encounter for screening mammogram for malignant neoplasm of breast: Secondary | ICD-10-CM | POA: Diagnosis not present

## 2023-06-10 DIAGNOSIS — Z6827 Body mass index (BMI) 27.0-27.9, adult: Secondary | ICD-10-CM | POA: Diagnosis not present

## 2023-06-10 DIAGNOSIS — Z01419 Encounter for gynecological examination (general) (routine) without abnormal findings: Secondary | ICD-10-CM | POA: Diagnosis not present

## 2023-06-19 DIAGNOSIS — M7061 Trochanteric bursitis, right hip: Secondary | ICD-10-CM | POA: Diagnosis not present

## 2023-06-19 DIAGNOSIS — Z96641 Presence of right artificial hip joint: Secondary | ICD-10-CM | POA: Diagnosis not present

## 2023-06-19 DIAGNOSIS — M1711 Unilateral primary osteoarthritis, right knee: Secondary | ICD-10-CM | POA: Diagnosis not present

## 2023-07-10 DIAGNOSIS — E538 Deficiency of other specified B group vitamins: Secondary | ICD-10-CM | POA: Diagnosis not present

## 2023-07-19 DIAGNOSIS — N958 Other specified menopausal and perimenopausal disorders: Secondary | ICD-10-CM | POA: Diagnosis not present

## 2023-07-19 DIAGNOSIS — M8588 Other specified disorders of bone density and structure, other site: Secondary | ICD-10-CM | POA: Diagnosis not present

## 2023-07-21 ENCOUNTER — Encounter (HOSPITAL_BASED_OUTPATIENT_CLINIC_OR_DEPARTMENT_OTHER): Payer: Self-pay

## 2023-07-21 ENCOUNTER — Ambulatory Visit (HOSPITAL_BASED_OUTPATIENT_CLINIC_OR_DEPARTMENT_OTHER)
Admission: RE | Admit: 2023-07-21 | Discharge: 2023-07-21 | Disposition: A | Discharge status: Home / Self Care | Source: Ambulatory Visit | Attending: Family Medicine | Admitting: Family Medicine

## 2023-07-21 VITALS — BP 160/84 | HR 75 | Temp 98.5°F | Resp 20

## 2023-07-21 DIAGNOSIS — R051 Acute cough: Secondary | ICD-10-CM | POA: Diagnosis not present

## 2023-07-21 DIAGNOSIS — J029 Acute pharyngitis, unspecified: Secondary | ICD-10-CM | POA: Diagnosis not present

## 2023-07-21 LAB — POCT RAPID STREP A (OFFICE): Rapid Strep A Screen: NEGATIVE

## 2023-07-21 NOTE — ED Provider Notes (Signed)
 [facility]    CSN: [id number] Arrival date & time: 07/21/23  0941      History   Chief Complaint Chief Complaint  Patient presents with   Sore Throat    Cough, headache,sore throat - Entered by patient   Headache   Fatigue    HPI Brandy Cruz is a 73 y.o. female.   73 year old female presents today with sore throat, cough, left ear pain, headache, chest congestion.  Started Friday.  Symptoms and pretty constant.  Denies any nasal congestion, runny nose, fever, chills, body aches.  Denies any history of allergies.   Sore Throat Associated symptoms include headaches.  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   Hyperlipidemia    Hypertension    OA (osteoarthritis) of knee 10/18/2014   Osteoarthritis of right knee 10/18/2014   Pain of left hip joint 06/11/2017    Patient Active Problem List   Diagnosis Date Noted   Osteoarthritis of right hip 10/02/2021   OA (osteoarthritis) of hip 08/09/2021   CAP (community acquired pneumonia) 06/07/2021   Localized swelling of both lower legs 06/07/2021   Pain in joint of left shoulder 03/09/2021   Hypertension    Hyperlipidemia    Hemorrhoids    Gastritis    Arthritis    Anxiety    Anemia    History of total knee replacement, 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 Internal hemorrhoids. Otherwise normal colonoscopy to terminal ileum   DILATION AND CURETTAGE OF UTERUS     ESOPHAGOGASTRODUODENOSCOPY  2001   TOTAL HIP ARTHROPLASTY Left 03/05/2018   Procedure: TOTAL HIP ARTHROPLASTY ANTERIOR APPROACH;  Surgeon: Liliane Rei, MD;  Location: [facility];  Service: Orthopedics;  Laterality: Left;   TOTAL HIP ARTHROPLASTY Right 10/02/2021   Procedure: TOTAL HIP ARTHROPLASTY ANTERIOR APPROACH;  Surgeon: Liliane Rei, MD;  Location: [facility];  Service: Orthopedics;  Laterality: Right;   TOTAL KNEE ARTHROPLASTY Left 10/18/2014   Procedure: LEFT TOTAL KNEE ARTHROPLASTY;  Surgeon: Liliane Rei, MD;  Location: [facility];  Service: Orthopedics;  Laterality: Left;    OB History   No obstetric history on file.      Home Medications    Prior to Admission medications   Medication Sig Start Date End Date Taking? Authorizing Provider  allopurinol  (ZYLOPRIM ) 100 MG tablet Take 100 mg by mouth daily.    [provider]  amLODipine  (NORVASC ) 5 MG tablet Take 5 mg by mouth daily.    [provider]  DOXYCYCLINE  HYCLATE PO Take 1 tablet by mouth 2 (two) times daily. Has 3 days remaining    [provider]  pantoprazole  (PROTONIX )  40 MG tablet TAKE 1 TABLET BY MOUTH EVERY DAY Patient taking differently: Take 40 mg by mouth daily as needed (heartburn). 08/07/21   Lajuan Pila, MD  potassium chloride  SA (KLOR-CON  M) 20 MEQ tablet Take 1 tablet (20 mEq total) by mouth daily. Additional refills will need to come from your Primary Care Provider. No more refills given by our office 03/05/22   Lajuan Pila, MD  valsartan -hydrochlorothiazide  (DIOVAN -HCT) 320-12.5 MG per tablet Take 1 tablet by mouth every morning. 09/05/14   [provider]  venlafaxine  XR (EFFEXOR -XR) 37.5 MG 24 hr capsule Take 1 capsule by mouth daily.    [provider]  VITAMIN D,  CHOLECALCIFEROL, PO Take 1 tablet by mouth 2 (two) times a week.    [provider]    Family History Family History  Problem Relation Age of Onset   Arthritis Mother    Prostate cancer Father    Colon cancer Neg Hx    Stomach cancer Neg Hx    Pancreatic cancer Neg Hx    Esophageal cancer Neg Hx    Liver disease Neg Hx     Social History Social History   Tobacco Use   Smoking status: Never   Smokeless tobacco: Never  Vaping Use   Vaping status: Never Used  Substance Use Topics   Alcohol use: Not Currently    Comment: occasionally   Drug use: No     Allergies   Sulfa antibiotics, Levaquin [levofloxacin in d5w], and Penicillins   Review of Systems Review of Systems  Neurological:  Positive for headaches.   See HPI  Physical Exam Triage Vital Signs ED Triage Vitals  Encounter Vitals Group     BP 07/21/23 0950 (!) 160/84     Systolic BP Percentile --      Diastolic BP Percentile --      Pulse Rate 07/21/23 0950 75     Resp 07/21/23 0950 20     Temp 07/21/23 0950 98.5 F (36.9 C)     Temp Source 07/21/23 0950 Oral     SpO2 07/21/23 0950 96 %     Weight --      Height --      Head Circumference --      Peak Flow --      Pain Score 07/21/23 0952 7     Pain Loc --      Pain Education --      Exclude from Growth Chart --    No data found.  Updated Vital Signs BP (!) 160/84 (BP Location: Right Arm)   Pulse 75   Temp 98.5 F (36.9 C) (Oral)   Resp 20   SpO2 96%   Visual Acuity Right Eye Distance:   Left Eye Distance:   Bilateral Distance:    Right Eye Near:   Left Eye Near:    Bilateral Near:     Physical Exam Constitutional:      General: She is not in acute distress.    Appearance: Normal appearance. She is not ill-appearing, toxic-appearing or diaphoretic.  HENT:     Head: Normocephalic and atraumatic.     Right Ear: Tympanic membrane and ear canal normal.     Left Ear: Tympanic membrane and ear canal normal.     Nose: No  congestion or rhinorrhea.     Mouth/Throat:     Pharynx: Oropharynx is clear. Posterior oropharyngeal erythema present.  Eyes:     Conjunctiva/sclera: Conjunctivae normal.  Cardiovascular:  Rate and Rhythm: Normal rate and regular rhythm.     Pulses: Normal pulses.     Heart sounds: Normal heart sounds.  Pulmonary:     Effort: Pulmonary effort is normal.     Breath sounds: Normal breath sounds.  Skin:    General: Skin is warm and dry.  Neurological:     Mental Status: She is alert.  Psychiatric:        Mood and Affect: Mood normal.      UC Treatments / Results  Labs (all labs ordered are listed, but only abnormal results are displayed) Labs Reviewed  POCT RAPID STREP A (OFFICE) - Normal    EKG   Radiology No results found.  Procedures Procedures (including critical care time)  Medications Ordered in UC Medications - No data to display  Initial Impression / Assessment and Plan / UC Course  I have reviewed the triage vital signs and the nursing notes.  Pertinent labs & imaging results that were available during my care of the patient were reviewed by me and considered in my medical decision making (see chart for details).    Sore throat and cough-no concerns on exam today.  Most likely allergies.  Recommend Zyrtec over-the-counter and Mucinex as needed.  Follow-up as needed  Final Clinical Impressions(s) / UC Diagnoses   Final diagnoses:  Sore throat  Acute cough   Discharge Instructions      I am not seeing anything concerning on your exam.  I believe your symptoms may be related to allergies.  Recommend doing allergy medicine like Zyrtec over-the-counter daily.  You can also do Mucinex for cough and congestion.  Follow-up as needed  ED Prescriptions   None    PDMP not reviewed this encounter.   Landa Pine, FNP 07/21/23 1011

## 2023-07-21 NOTE — Discharge Instructions (Signed)
 I am not seeing anything concerning on your exam.  I believe your symptoms may be related to allergies.  Recommend doing allergy medicine like Zyrtec over-the-counter daily.  You can also do Mucinex for cough and congestion.  Follow-up as needed

## 2023-07-21 NOTE — ED Triage Notes (Signed)
 Onset of sore throat, headache, left ear pain, and chest congestion Friday. Denies runny nose or head congestion.

## 2023-07-30 DIAGNOSIS — R300 Dysuria: Secondary | ICD-10-CM | POA: Diagnosis not present

## 2023-07-30 DIAGNOSIS — Z6827 Body mass index (BMI) 27.0-27.9, adult: Secondary | ICD-10-CM | POA: Diagnosis not present

## 2023-08-09 DIAGNOSIS — R300 Dysuria: Secondary | ICD-10-CM | POA: Diagnosis not present

## 2023-08-15 DIAGNOSIS — D519 Vitamin B12 deficiency anemia, unspecified: Secondary | ICD-10-CM | POA: Diagnosis not present

## 2023-08-28 DIAGNOSIS — L820 Inflamed seborrheic keratosis: Secondary | ICD-10-CM | POA: Diagnosis not present

## 2023-08-28 DIAGNOSIS — B079 Viral wart, unspecified: Secondary | ICD-10-CM | POA: Diagnosis not present

## 2023-09-19 DIAGNOSIS — M1711 Unilateral primary osteoarthritis, right knee: Secondary | ICD-10-CM | POA: Diagnosis not present

## 2023-10-16 DIAGNOSIS — D519 Vitamin B12 deficiency anemia, unspecified: Secondary | ICD-10-CM | POA: Diagnosis not present

## 2023-10-29 NOTE — Progress Notes (Unsigned)
 10/30/2023 Brandy Cruz [id number] [birth year]  Referring provider: Ina Marcellus RAMAN, MD Primary GI doctor: Dr. Charlanne  ASSESSMENT AND PLAN:  Epigastric pain 3-4 weeks ago, burning pain, no radiation Denies Nausea, vomiting, melena, dysphagia She does have bloating, no early satiety  Had increase stress, weight gain and poor eating while husband in hospital for sepsis/valve vegetation for 30 days Status post lap cholecystectomy  09/2020 EGD unremarkable other than small [facility] 03/13/2022 ABUS mild hepatic steatosis, 2.7 cm left liver cyst previous cholecystectomy normal bile ducts On pantoprazole  40 mg as needed, has been on once daily for 2 weeks, carafate  twice a day since that time Intermittent epigastric pain likely due to gastritis and hiatal hernia. Pain improved with pantoprazole  and Carafate . Stress, diet, and weight gain may contribute. No alarm symptoms. Differential includes bile reflux postcholecystectomy. - Continue pantoprazole  daily for 6-8 weeks. - Discontinue Carafate . - Check liver function tests, CBC, and lipase. - Advise dietary modifications and weight management. - Consider repeat EGD if symptoms persist.  Diarrhea 05/2020 colonoscopy moderate sigmoid diverticulosis nonbleeding IH, no recall unless issues Once or twice a day in the morning, occ yellow, can skip a day Loose stools and bloating possibly due to bile reflux or SIBO. 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 SIBO testing and treatment discussed but deferred. - Provide information on SIBO and potential testing/treatment options. - Advise on low-fat diet to manage bile reflux. - Recommend fiber supplementation to improve stool consistency.  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    Latest Ref Rng & Units  03/02/2022    4:34 PM 03/11/2020    9:00 AM 02/24/2018   11:06 AM  Hepatic Function  Total Protein 6.0 - 8.3 g/dL 6.9  6.9  7.0   Albumin 3.5 - 5.2 g/dL 4.0  4.3  4.0   AST 0 - 37 U/L 16  21  19    ALT 0 - 35 U/L 15  19  20    Alk Phosphatase 39 - 117 U/L 66  76  68   Total Bilirubin 0.2 - 1.2 mg/dL 0.5  0.7  0.8   Bilirubin, Direct 0.00 - 0.40 mg/dL  9.78     Platelets 686.9  - Check liver enzymes - If enzymes elevated, may consider additional serologic work-up to rule out concomitant liver disease - Depending on above evaluation and FIB-4, can consider ultrasound elastography versus fibrosure to follow-up  - need LFTs and CBC monitored every 6 months, - evaluation with imaging every 2-3 years.  - Encouraged diet/exercise for modest 10% body weight loss as treatment for hepatic steatosis -Continue to work on risk factor modification including diet exercise and control of risk factors including blood sugars.   Patient Care Team: Ina Marcellus RAMAN, MD as PCP - General (Family Medicine)  HISTORY OF PRESENT ILLNESS: 73 y.o. female with a past medical history listed below presents for evaluation of epigastric pain.   Patient last seen in the office 03/02/2022 by Dr. Charlanne for epigastric pain  Discussed the use of AI scribe software for clinical note transcription with the patient, who gave verbal consent to proceed.  History of Present Illness   Brandy Cruz is a 73 year old female with a history of hiatal hernia and fatty liver who presents with abdominal pain and bloating.  She experiences abdominal pain primarily in the epigastric region, described  as a burning sensation. A small hiatal hernia was identified during an EGD in 2022, which was otherwise normal. An ultrasound also showed fatty liver and a small cyst. She recalls being told she had gastritis and was prescribed sucralfate , which she took for a while and then discontinued. Recently, she experienced severe abdominal pain about three  to four weeks ago, prompting her to restart sucralfate , which has improved her symptoms.  She has been taking pantoprazole  intermittently, but recently increased to once daily for the past two weeks. No trouble swallowing, nausea, vomiting, or black stools. She reports loose stools almost daily, sometimes feeling bloated, but does not get full easily. She denies any radiation of pain to the back.  Her husband was recently hospitalized for 27 days, during which time she did not eat properly and gained weight. She attributes some of her dietary changes and stress to this period. She does not consume alcohol or use tobacco, and she only takes Tylenol  for pain due to her blood pressure medication.  She has been experiencing loose bowel movements almost daily, with stools sometimes appearing yellowish. She typically has a bowel movement once a day, occasionally twice, and they are not always loose. She has not started any new medications or supplements recently.        She  reports that she has never smoked. She has never used smokeless tobacco. She reports that she does not currently use alcohol. She reports that she does not use drugs.  RELEVANT GI HISTORY, IMAGING AND LABS: Results   RADIOLOGY Abdominal ultrasound: Fatty liver, small cyst, otherwise normal  DIAGNOSTIC EGD: Small hiatal hernia, otherwise normal (2022)     EGD 10/21/2020 - Small hiatal hernia. - Gastritis. Bx-negative for H. pylori - A single gastric polyp. Resected and retrieved. Bx-fundic gland polyp. - Normal examined duodenum. Bx-negative for celiac disease   Colonoscopy 05/2020 - Moderate sigmoid diverticulosis. - Non-bleeding internal hemorrhoids. - The examined portion of the ileum was normal. - The examination was otherwise normal on direct and retroflexion views. - No need to repeat unless problems.   CT AP 04/2020 1. Findings suggestive of acute sigmoid diverticulitis. Given the slightly asymmetric appearance  of colonic wall thickening at this location, underlying colonic malignancy is not excluded. Recommend colonoscopy following resolution of patient's acute symptoms. 2. Circumferential rectal wall thickening, which may represent proctitis. 3. Aortic atherosclerosis (ICD10-I70.0). CBC    Component Value Date/Time   WBC 7.9 03/02/2022 1634   RBC 3.97 03/02/2022 1634   HGB 12.9 03/02/2022 1634   HCT 37.4 03/02/2022 1634   PLT 313.0 03/02/2022 1634   MCV 94.2 03/02/2022 1634   MCH 33.2 10/03/2021 0238   MCHC 34.4 03/02/2022 1634   RDW 14.4 03/02/2022 1634   LYMPHSABS 3.0 03/02/2022 1634   MONOABS 0.6 03/02/2022 1634   EOSABS 0.3 03/02/2022 1634   BASOSABS 0.1 03/02/2022 1634   No results for input(s): HGB in the last 8760 hours.  CMP     Component Value Date/Time   NA 137 03/02/2022 1634   NA 139 06/07/2021 1159   K 3.1 (L) 03/02/2022 1634   CL 98 03/02/2022 1634   CO2 31 03/02/2022 1634   GLUCOSE 105 (H) 03/02/2022 1634   BUN 9 03/02/2022 1634   BUN 9 06/07/2021 1159   CREATININE 0.64 03/02/2022 1634   CALCIUM  9.2 03/02/2022 1634   PROT 6.9 03/02/2022 1634   PROT 6.9 03/11/2020 0900   ALBUMIN 4.0 03/02/2022 1634   ALBUMIN 4.3 03/11/2020  0900   AST 16 03/02/2022 1634   ALT 15 03/02/2022 1634   ALKPHOS 66 03/02/2022 1634   BILITOT 0.5 03/02/2022 1634   BILITOT 0.7 03/11/2020 0900   GFRNONAA >60 10/03/2021 0238   GFRAA 104 02/29/2020 0953      Latest Ref Rng & Units 03/02/2022    4:34 PM 03/11/2020    9:00 AM 02/24/2018   11:06 AM  Hepatic Function  Total Protein 6.0 - 8.3 g/dL 6.9  6.9  7.0   Albumin 3.5 - 5.2 g/dL 4.0  4.3  4.0   AST 0 - 37 U/L 16  21  19    ALT 0 - 35 U/L 15  19  20    Alk Phosphatase 39 - 117 U/L 66  76  68   Total Bilirubin 0.2 - 1.2 mg/dL 0.5  0.7  0.8   Bilirubin, Direct 0.00 - 0.40 mg/dL  9.78        Current Medications:    Current Outpatient Medications (Cardiovascular):    amLODipine  (NORVASC ) 5 MG tablet, Take 5 mg by mouth  daily.   valsartan -hydrochlorothiazide  (DIOVAN -HCT) 320-12.5 MG per tablet, Take 1 tablet by mouth every morning.   Current Outpatient Medications (Analgesics):    acetaminophen  (TYLENOL ) 500 MG tablet, Take 500 mg by mouth every 8 (eight) hours as needed for moderate pain (pain score 4-6).   allopurinol  (ZYLOPRIM ) 100 MG tablet, Take 100 mg by mouth daily.   Current Outpatient Medications (Other):    pantoprazole  (PROTONIX ) 40 MG tablet, TAKE 1 TABLET BY MOUTH EVERY DAY   potassium chloride  SA (KLOR-CON  M) 20 MEQ tablet, Take 1 tablet (20 mEq total) by mouth daily. Additional refills will need to come from your Primary Care Provider. No more refills given by our office   sucralfate  (CARAFATE ) 1 g tablet, Take 1 g by mouth 4 (four) times daily.   venlafaxine  XR (EFFEXOR -XR) 37.5 MG 24 hr capsule, Take 1 capsule by mouth daily.   VITAMIN D, CHOLECALCIFEROL, PO, Take 1 tablet by mouth 2 (two) times a week.   Vitamin D, Ergocalciferol, (DRISDOL) 1.25 MG (50000 UNIT) CAPS capsule, Take 50,000 Units by mouth every 7 (seven) days.  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  Tolerated Cephalosporin Date: 10/03/21.       Surgical History:  She  has a past surgical history that includes  Cholecystectomy; Appendectomy; Total knee arthroplasty (Left, 10/18/2014); Total hip arthroplasty (Left, 03/05/2018); Esophagogastroduodenoscopy (2001); Colonoscopy (07/19/2010); Dilation and curettage of uterus; Cataract extraction w/ intraocular lens implant (Bilateral); and Total hip arthroplasty (Right, 10/02/2021). Family History:  Her family history includes Arthritis in her mother; Prostate cancer in  her father.  REVIEW OF SYSTEMS  : All other systems reviewed and negative except where noted in the History of Present Illness.  PHYSICAL EXAM: BP 138/78 (BP Location: Left Arm, Patient Position: Sitting, Cuff Size: Normal)   Pulse 78   Ht 5' 4 (1.626 m)   Wt 165 lb 6 oz (75 kg)   BMI 28.39 kg/m  Physical Exam   GENERAL APPEARANCE: Well nourished, in no apparent distress. HEENT: No cervical lymphadenopathy, unremarkable thyroid , sclerae anicteric, conjunctiva pink. RESPIRATORY: Respiratory effort normal, breath sounds equal bilaterally without rales, rhonchi, or wheezing. Lungs clear to auscultation bilaterally. CARDIO: Regular rate and rhythm with no murmurs, rubs, or gallops, peripheral pulses intact. ABDOMEN: Soft, non-distended, active bowel sounds in all four quadrants, non-tender to palpation, no rebound tenderness, no masses appreciated. RECTAL: Declines. MUSCULOSKELETAL: Full range of motion, normal gait, without edema. SKIN: Dry, intact without rashes or lesions. No jaundice. NEURO: Alert, oriented, no focal deficits. PSYCH: Cooperative, normal mood and affect.      Alan JONELLE Coombs, PA-C 10:28 AM

## 2023-10-30 ENCOUNTER — Ambulatory Visit: Payer: Self-pay | Admitting: Physician Assistant

## 2023-10-30 ENCOUNTER — Other Ambulatory Visit (INDEPENDENT_AMBULATORY_CARE_PROVIDER_SITE_OTHER)

## 2023-10-30 ENCOUNTER — Ambulatory Visit: Admitting: Physician Assistant

## 2023-10-30 ENCOUNTER — Encounter: Payer: Self-pay | Admitting: Physician Assistant

## 2023-10-30 VITALS — BP 138/78 | HR 78 | Ht 64.0 in | Wt 165.4 lb

## 2023-10-30 DIAGNOSIS — R195 Other fecal abnormalities: Secondary | ICD-10-CM

## 2023-10-30 DIAGNOSIS — K760 Fatty (change of) liver, not elsewhere classified: Secondary | ICD-10-CM

## 2023-10-30 DIAGNOSIS — R1013 Epigastric pain: Secondary | ICD-10-CM | POA: Diagnosis not present

## 2023-10-30 DIAGNOSIS — Z9049 Acquired absence of other specified parts of digestive tract: Secondary | ICD-10-CM

## 2023-10-30 DIAGNOSIS — K219 Gastro-esophageal reflux disease without esophagitis: Secondary | ICD-10-CM | POA: Diagnosis not present

## 2023-10-30 LAB — COMPREHENSIVE METABOLIC PANEL WITH GFR
ALT: 17 U/L (ref 0–35)
AST: 17 U/L (ref 0–37)
Albumin: 4.2 g/dL (ref 3.5–5.2)
Alkaline Phosphatase: 65 U/L (ref 39–117)
BUN: 8 mg/dL (ref 6–23)
CO2: 31 meq/L (ref 19–32)
Calcium: 9.3 mg/dL (ref 8.4–10.5)
Chloride: 96 meq/L (ref 96–112)
Creatinine, Ser: 0.64 mg/dL (ref 0.40–1.20)
GFR: 86.36 mL/min (ref >60.00)
Glucose, Bld: 99 mg/dL (ref 70–99)
Potassium: 3.3 meq/L — ABNORMAL LOW (ref 3.5–5.1)
Sodium: 138 meq/L (ref 135–145)
Total Bilirubin: 0.6 mg/dL (ref 0.2–1.2)
Total Protein: 7.3 g/dL (ref 6.0–8.3)

## 2023-10-30 LAB — CBC WITH DIFFERENTIAL/PLATELET
Basophils Absolute: 0 K/uL (ref 0.0–0.1)
Basophils Relative: 0.3 % (ref 0.0–3.0)
Eosinophils Absolute: 0.1 K/uL (ref 0.0–0.7)
Eosinophils Relative: 1.2 % (ref 0.0–5.0)
HCT: 42.5 % (ref 36.0–46.0)
Hemoglobin: 14.5 g/dL (ref 12.0–15.0)
Lymphocytes Relative: 36.2 % (ref 12.0–46.0)
Lymphs Abs: 2.5 K/uL (ref 0.7–4.0)
MCHC: 34.2 g/dL (ref 30.0–36.0)
MCV: 98.4 fl (ref 78.0–100.0)
Monocytes Absolute: 0.6 K/uL (ref 0.1–1.0)
Monocytes Relative: 8.6 % (ref 3.0–12.0)
Neutro Abs: 3.7 K/uL (ref 1.4–7.7)
Neutrophils Relative %: 53.7 % (ref 43.0–77.0)
Platelets: 287 K/uL (ref 150.0–400.0)
RBC: 4.32 Mil/uL (ref 3.87–5.11)
RDW: 12.9 % (ref 11.5–15.5)
WBC: 6.8 K/uL (ref 4.0–10.5)

## 2023-10-30 LAB — SEDIMENTATION RATE: Sed Rate: 8 mm/h (ref 0–30)

## 2023-10-30 LAB — LIPASE: Lipase: 31 U/L (ref 11.0–59.0)

## 2023-10-30 NOTE — Patient Instructions (Addendum)
 Your provider has requested that you go to the basement level for lab work before leaving today. Press B on the elevator. The lab is located at the first door on the left as you exit the elevator.  Please take your proton pump inhibitor medication, pantoprazole  40 mg daily for at least 2 months  Please take this medication 30 minutes to 1 hour before meals- this makes it more effective.  Avoid spicy and acidic foods Avoid fatty foods Limit your intake of coffee, tea, alcohol, and carbonated drinks Work to maintain a healthy weight Keep the head of the bed elevated at least 3 inches with blocks or a wedge pillow if you are having any nighttime symptoms Stay upright for 2 hours after eating Avoid meals and snacks three to four hours before bedtime  FIBER SUPPLEMENT You can do metamucil or fibercon once or twice a day but if this causes gas/bloating please switch to Benefiber or Citracel.  Fiber is good for constipation/diarrhea/irritable bowel syndrome.  It can also help with weight loss and can help lower your bad cholesterol (LDL).  Please do 1 TBSP in the morning in water , coffee, or tea.  It can take up to a month before you can see a difference with your bowel movements.  It is cheapest from [employer], [employer], [employer].    Small intestinal bacterial overgrowth (SIBO) occurs when there is an abnormal increase in the overall bacterial population in the small intestine -- particularly types of bacteria not commonly found in that part of the digestive tract. Small intestinal bacterial overgrowth (SIBO) commonly results when a circumstance -- such as surgery or disease -- slows the passage of food and waste products in the digestive tract, creating a breeding ground for bacteria.  Signs and symptoms of SIBO often include: Loss of appetite Abdominal pain Nausea Bloating An uncomfortable feeling of fullness after eating Diarrhea or constipation, depending on the type of gas produced  What  foods trigger SIBO? While foods aren't the original cause of SIBO, certain foods do encourage the overgrowth of the wrong bacteria in your small intestine. If you're feeding them their favorite foods, they're going to grow more, and that will trigger more of your SIBO symptoms. By the same token, you can help reduce the overgrowth by starving the problematic bacteria of their favorite foods. This strategy has led to a number of proposed SIBO eating plans. The plans vary, and so do individual results. But in general, they tend to recommend limiting carbohydrates.  These include: Sugars and sweeteners. Fruits and starchy vegetables. Dairy products. Grains.  There is a test for this we can do called a breath test, if you are positive we will treat you with an antibiotic to see if it helps.  Your symptoms are very suspicious for this condition, as discussed, we will start you on an antibiotic to see if this helps.

## 2023-10-31 DIAGNOSIS — F32A Depression, unspecified: Secondary | ICD-10-CM | POA: Insufficient documentation

## 2023-10-31 DIAGNOSIS — H269 Unspecified cataract: Secondary | ICD-10-CM | POA: Insufficient documentation

## 2023-10-31 DIAGNOSIS — K219 Gastro-esophageal reflux disease without esophagitis: Secondary | ICD-10-CM | POA: Insufficient documentation

## 2023-11-01 ENCOUNTER — Ambulatory Visit: Attending: Cardiology | Admitting: Cardiology

## 2023-11-01 ENCOUNTER — Encounter: Payer: Self-pay | Admitting: Cardiology

## 2023-11-01 VITALS — BP 140/90 | HR 68 | Ht 64.0 in | Wt 166.0 lb

## 2023-11-01 DIAGNOSIS — K219 Gastro-esophageal reflux disease without esophagitis: Secondary | ICD-10-CM

## 2023-11-01 DIAGNOSIS — F419 Anxiety disorder, unspecified: Secondary | ICD-10-CM | POA: Diagnosis not present

## 2023-11-01 DIAGNOSIS — I10 Essential (primary) hypertension: Secondary | ICD-10-CM | POA: Diagnosis not present

## 2023-11-01 DIAGNOSIS — E785 Hyperlipidemia, unspecified: Secondary | ICD-10-CM | POA: Diagnosis not present

## 2023-11-01 NOTE — Patient Instructions (Signed)

## 2023-11-01 NOTE — Progress Notes (Signed)
 Cardiology Office Note:    Date:  11/01/2023   ID:  Brandy Cruz, DOB [birth year], MRN [id number]  PCP:  Ina Marcellus RAMAN, MD  Cardiologist:  Lamar Fitch, MD    Referring MD: Ina Marcellus RAMAN, MD   No chief complaint on file.   History of Present Illness:    Brandy Cruz is a 73 y.o. female with past medical history significant for atypical chest pain, coronary CT angio done which showed normal coronary arteries without obstruction and calcium  score 0, essential hypertension, dyslipidemia.  Comes today to my office for follow-up overall doing great.  Asymptomatic does what she wants to do admits that she does not exercise on the regular basis and she is telling me she wants to exercise more.  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 Internal hemorrhoids. Otherwise normal colonoscopy to terminal ileum   DILATION AND CURETTAGE OF UTERUS     ESOPHAGOGASTRODUODENOSCOPY  2001   TOTAL HIP ARTHROPLASTY Left 03/05/2018   Procedure: TOTAL HIP ARTHROPLASTY ANTERIOR APPROACH;  Surgeon: Melodi Lerner, MD;  Location: [facility];  Service: Orthopedics;  Laterality: Left;   TOTAL HIP ARTHROPLASTY Right 10/02/2021   Procedure: TOTAL HIP ARTHROPLASTY ANTERIOR APPROACH;  Surgeon: Melodi Lerner, MD;  Location: [facility];  Service: Orthopedics;  Laterality: Right;   TOTAL KNEE ARTHROPLASTY Left 10/18/2014   Procedure: LEFT TOTAL KNEE ARTHROPLASTY;  Surgeon: Lerner Melodi, MD;  Location: [facility];  Service: Orthopedics;  Laterality: Left;    Current Medications: Current Meds  Medication Sig   acetaminophen  (TYLENOL ) 500 MG tablet Take 500 mg by mouth every 8 (eight) hours as needed for moderate pain (pain score 4-6).   allopurinol  (ZYLOPRIM ) 100 MG tablet Take 100 mg by mouth daily.   amLODipine  (NORVASC ) 2.5 MG tablet Take 2.5 mg by mouth at bedtime.   pantoprazole  (PROTONIX ) 40 MG tablet TAKE 1 TABLET BY MOUTH EVERY DAY   potassium chloride  SA (KLOR-CON  M) 20 MEQ tablet Take 1 tablet (20 mEq total) by mouth daily. Additional refills will need to come from your Primary Care Provider. No more refills given by our office   sucralfate  (CARAFATE ) 1 g tablet Take 1 g by mouth 4 (four) times daily.   valsartan -hydrochlorothiazide  (DIOVAN -HCT) 320-12.5 MG per tablet Take 1 tablet by mouth every morning.   venlafaxine  XR (EFFEXOR -XR) 75 MG 24 hr capsule Take 75 mg by mouth daily.   VITAMIN D, CHOLECALCIFEROL, PO Take 1 tablet  by mouth 2 (two) times a week.   Vitamin D, Ergocalciferol, (DRISDOL) 1.25 MG (50000 UNIT) CAPS capsule Take 50,000 Units by mouth every 7 (seven) days.     Allergies:   Sulfa antibiotics, Levaquin [levofloxacin in d5w], and Penicillins   Social History   Socioeconomic History   Marital status: Married    Spouse name: Not on file   Number of children: 3   Years of education: Not on file   Highest education level: Not on file  Occupational History   Occupation: Retired  Tobacco Use   Smoking status: Never   Smokeless tobacco: Never  Vaping Use   Vaping status: Never Used   Substance and Sexual Activity   Alcohol use: Not Currently    Comment: occasionally   Drug use: No   Sexual activity: Not Currently  Other Topics Concern   Not on file  Social History Narrative   Not on file   Social Drivers of Health   Financial Resource Strain: Not on file  Food Insecurity: Low Risk  (03/05/2023)   Received from [facility]   Hunger Vital Sign    Within the past 12 months, you worried that your food would run out before you got money to buy more: Never true    Within the past 12 months, the food you bought just didn't last and you didn't have money to get more. : Never true  Transportation Needs: No Transportation Needs (03/05/2023)   Received from [facility]    In the past 12 months, has lack of reliable transportation kept you from medical appointments, meetings, work or from getting things needed for daily living? : No  Physical Activity: Not on file  Stress: Not on file  Social Connections: Not on file     Family History: The patient's family history includes Arthritis in her mother; Prostate cancer in her father. There is no history of Colon cancer, Stomach cancer, Pancreatic cancer, Esophageal cancer, or Liver disease. ROS:   Please see the history of present illness.    All 14 point review of systems negative except as described per history of present illness  EKGs/Labs/Other Studies Reviewed:    EKG Interpretation Date/Time:  Friday November 01 2023 08:05:05 EDT Ventricular Rate:  68 PR Interval:  180 QRS Duration:  76 QT Interval:  406 QTC Calculation: 431 R Axis:   82  Text Interpretation: Sinus rhythm with frequent Premature ventricular complexes Cannot rule out Anterior infarct (cited on or before 24-Feb-2018) Abnormal ECG When compared with ECG of 17-Sep-2022 09:00, Premature ventricular complexes are now Present Questionable change in initial forces of Anterior leads Confirmed by Bernie Charleston [phone]) on 11/01/2023  8:09:51 AM    Recent Labs: 10/30/2023: ALT 17; BUN 8; Creatinine, Ser 0.64; Hemoglobin 14.5; Platelets 287.0; Potassium 3.3; Sodium 138  Recent Lipid Panel    Component Value Date/Time   CHOL 130 03/11/2020 0900   TRIG 127 03/11/2020 0900   HDL 53 03/11/2020 0900   CHOLHDL 2.5 03/11/2020 0900   LDLCALC 55 03/11/2020 0900    Physical Exam:    VS:  BP (!) 140/90   Pulse 68   Ht 5' 4 (1.626 m)   Wt 166 lb (75.3 kg)   SpO2 97%   BMI 28.49 kg/m     Wt Readings from Last 3 Encounters:  11/01/23 166 lb (75.3 kg)  10/30/23 165 lb 6 oz (75 kg)  09/17/22 163 lb (73.9 kg)  GEN:  Well nourished, well developed in no acute distress HEENT: Normal NECK: No JVD; No carotid bruits LYMPHATICS: No lymphadenopathy CARDIAC: RRR, no murmurs, no rubs, no gallops RESPIRATORY:  Clear to auscultation without rales, wheezing or rhonchi  ABDOMEN: Soft, non-tender, non-distended MUSCULOSKELETAL:  No edema; No deformity  SKIN: Warm and dry LOWER EXTREMITIES: no swelling NEUROLOGIC:  Alert and oriented x 3 PSYCHIATRIC:  Normal affect   ASSESSMENT:    1. Primary hypertension   2. Gastroesophageal reflux disease, unspecified whether esophagitis present   3. Dyslipidemia   4. Anxiety    PLAN:    In order of problems listed above:  Essential hypertension blood pressure elevated today but she said at home usually 130/70 or 80. Gastroesophageal reflux disease stable. Dyslipidemia I did review K PN which show me her total cholesterol 182 HDL 47.  This is after October of last year but She is going to her primary care physician and she will have cholesterol rechecked will wait for results of this. Anxiety.  Stable.   Medication Adjustments/Labs and Tests Ordered: Current medicines are reviewed at length with the patient today.  Concerns regarding medicines are outlined above.  Orders Placed This Encounter  Procedures   EKG 12-Lead   Medication changes: No orders of the defined types  were placed in this encounter.   Signed, Lamar DOROTHA Fitch, MD, [facility] 11/01/2023 8:18 AM    [facility]

## 2023-11-19 DIAGNOSIS — E538 Deficiency of other specified B group vitamins: Secondary | ICD-10-CM | POA: Diagnosis not present

## 2023-11-28 ENCOUNTER — Telehealth: Payer: Self-pay | Admitting: Physician Assistant

## 2023-11-28 DIAGNOSIS — K760 Fatty (change of) liver, not elsewhere classified: Secondary | ICD-10-CM

## 2023-11-28 DIAGNOSIS — R195 Other fecal abnormalities: Secondary | ICD-10-CM

## 2023-11-28 DIAGNOSIS — R1013 Epigastric pain: Secondary | ICD-10-CM

## 2023-11-28 DIAGNOSIS — K219 Gastro-esophageal reflux disease without esophagitis: Secondary | ICD-10-CM

## 2023-11-28 NOTE — Telephone Encounter (Addendum)
 Inbound call from patient stating she has been having a lot of pain in her RUQ abdomen. States she is also having a significant amount of diarrhea. Patient is requesting a call back to discuss further. States does not think she will be able to wait until 10/21 to be seen.Please advise, thank you.

## 2023-11-29 MED ORDER — CHOLESTYRAMINE 4 G PO PACK: 4.0000 g | PACK | Freq: Every day | ORAL | 2 refills | Status: DC

## 2023-11-29 NOTE — Telephone Encounter (Signed)
 Called and spoke with patient. Patient reports that symptoms remain the same. Patient reports that she sometimes has more than 3 BM's a day. I advised patient to stop by 2nd floor receptionist desk to pick up Di[employer] stool kit. Patient has been advised that she will need to collect sample at home and then return kit directly to the lab at her earliest convenience. Patient has not been taking Carafate  because she thought she was told to stop it. I advised patient that she can resume Carafate  if that helped her symptoms. Patient would also like to try Questran in the interim. Patient confirmed pharmacy on file. EGD with Dr. Charlanne has been scheduled for 01/02/24 arriving at 3 pm. Patient has been added to the appt wait list in case sooner appt becomes available. Patient verbalized understanding and had no concerns at the end of the call.  EGD instructions sent via MyChart, hard copy will be mailed.  Ambulatory referral to GI in epic.  Questran prescription sent to pharmacy on file.

## 2023-12-02 NOTE — Telephone Encounter (Signed)
 Diatherix stool kit and prep instructions placed at 2nd floor receptionist desk.

## 2023-12-10 ENCOUNTER — Telehealth: Payer: Self-pay | Admitting: Physician Assistant

## 2023-12-10 NOTE — Telephone Encounter (Signed)
 Inbound call from patient requesting a call to discuss if she needs to reschedule 10/21 follow up visit. Please advise, thank you

## 2023-12-10 NOTE — Telephone Encounter (Signed)
 Spoke with patient and rescheduled f/u for 01/08/24

## 2023-12-13 ENCOUNTER — Telehealth: Payer: Self-pay | Admitting: Physician Assistant

## 2023-12-13 NOTE — Telephone Encounter (Signed)
 Inbound call from patient stating that she had spoke with someone from our office about dates she collected her stool sample. She states she does not know who she spoke with and is requesting a call back to discuss because she give the wrong date. Please advise.

## 2023-12-13 NOTE — Telephone Encounter (Signed)
 Returned call to patient & she wanted to clarify that the sample was turned in on 12/10/23. Advised her if results were posted I would relay them to [facility] to review & we will be in touch.

## 2023-12-17 ENCOUNTER — Ambulatory Visit: Admitting: Physician Assistant

## 2023-12-20 DIAGNOSIS — M1711 Unilateral primary osteoarthritis, right knee: Secondary | ICD-10-CM | POA: Diagnosis not present

## 2023-12-25 ENCOUNTER — Encounter: Payer: Self-pay | Admitting: Physician Assistant

## 2023-12-26 ENCOUNTER — Encounter: Payer: Self-pay | Admitting: Gastroenterology

## 2023-12-26 DIAGNOSIS — Z23 Encounter for immunization: Secondary | ICD-10-CM | POA: Diagnosis not present

## 2023-12-27 ENCOUNTER — Other Ambulatory Visit: Payer: Self-pay | Admitting: Medical Genetics

## 2024-01-02 ENCOUNTER — Ambulatory Visit (AMBULATORY_SURGERY_CENTER): Admitting: Gastroenterology

## 2024-01-02 ENCOUNTER — Encounter: Payer: Self-pay | Admitting: Gastroenterology

## 2024-01-02 VITALS — BP 162/90 | HR 74 | Temp 97.3°F | Resp 17 | Ht 64.0 in | Wt 165.6 lb

## 2024-01-02 DIAGNOSIS — I10 Essential (primary) hypertension: Secondary | ICD-10-CM | POA: Diagnosis not present

## 2024-01-02 DIAGNOSIS — R1013 Epigastric pain: Secondary | ICD-10-CM | POA: Diagnosis not present

## 2024-01-02 DIAGNOSIS — K449 Diaphragmatic hernia without obstruction or gangrene: Secondary | ICD-10-CM | POA: Diagnosis not present

## 2024-01-02 DIAGNOSIS — F32A Depression, unspecified: Secondary | ICD-10-CM | POA: Diagnosis not present

## 2024-01-02 DIAGNOSIS — K3189 Other diseases of stomach and duodenum: Secondary | ICD-10-CM

## 2024-01-02 DIAGNOSIS — F419 Anxiety disorder, unspecified: Secondary | ICD-10-CM | POA: Diagnosis not present

## 2024-01-02 DIAGNOSIS — E785 Hyperlipidemia, unspecified: Secondary | ICD-10-CM | POA: Diagnosis not present

## 2024-01-02 DIAGNOSIS — K2970 Gastritis, unspecified, without bleeding: Secondary | ICD-10-CM | POA: Diagnosis not present

## 2024-01-02 MED ORDER — SODIUM CHLORIDE 0.9 % IV SOLN: 500.0000 mL | Freq: Once | INTRAVENOUS | Status: DC

## 2024-01-02 NOTE — Op Note (Signed)
 [facility] Patient Name: Brandy Cruz Procedure Date: 01/02/2024 4:03 PM MRN: [id number] Endoscopist: Lynnie Bring , MD, [id number] Age: 73 Referring MD:  Date of Birth: 1950-09-21 Gender: Female Account #: [id number] Procedure:                Upper GI endoscopy Indications:              Epigastric abdominal pain Medicines:                Monitored Anesthesia Care Procedure:                Pre-Anesthesia Assessment:                           - Prior to the procedure, a History and Physical                            was performed, and patient medications and                            allergies were reviewed. The patient's tolerance of                            previous anesthesia was also reviewed. The risks                            and benefits of the procedure and the sedation                            options and risks were discussed with the patient.                            All questions were answered, and informed consent                            was obtained. Prior Anticoagulants: The patient has                            taken no anticoagulant or antiplatelet agents. ASA                            Grade Assessment: II - A patient with mild systemic                            disease. After reviewing the risks and benefits,                            the patient was deemed in satisfactory condition to                            undergo the procedure.                           After obtaining informed consent, the endoscope was  passed under direct vision. Throughout the                            procedure, the patient's blood pressure, pulse, and                            oxygen saturations were monitored continuously. The                            Olympus scope [id number] was introduced through the                            mouth, and advanced to the second part of duodenum.                            The upper GI endoscopy was  accomplished without                            difficulty. The patient tolerated the procedure                            well. Scope In: Scope Out: Findings:                 The examined esophagus was normal with well-defined                            Z-line at 35 cm.                           A small ( 1 cm) hiatal hernia was present.                           Diffuse mild inflammation characterized by                            congestion (edema) and erythema was found in the                            entire examined stomach. Biopsies were taken with a                            cold forceps for histology (from body, antrum and                            fundus).                           The examined duodenum was normal. Complications:            No immediate complications. Estimated Blood Loss:     Estimated blood loss: none. Impression:               - Small hiatal hernia.                           -  Mild Gastritis. Biopsied.                           - Otherwise normal EGD. Recommendation:           - Patient has a contact number available for                            emergencies. The signs and symptoms of potential                            delayed complications were discussed with the                            patient. Return to normal activities tomorrow.                            Written discharge instructions were provided to the                            patient.                           - Resume previous diet.                           - Continue present medications including Protonix                             40 mg p.o. daily.                           - Await pathology results.                           - Avoid nonsteroidals.                           - If still with problems, further evaluation by                            means of GES.                           - The findings and recommendations were discussed                            with the  patient's family. Lynnie Bring, MD 01/02/2024 4:26:27 PM This report has been signed electronically.

## 2024-01-02 NOTE — Progress Notes (Signed)
 Called to room to assist during endoscopic procedure.  Patient ID and intended procedure confirmed with present staff. Received instructions for my participation in the procedure from the performing physician.

## 2024-01-02 NOTE — Patient Instructions (Addendum)
 YOU HAD AN ENDOSCOPIC PROCEDURE TODAY AT THE [facility]:   Refer to the procedure report that was given to you for any specific questions about what was found during the examination.  If the procedure report does not answer your questions, please call your gastroenterologist to clarify.  If you requested that your care partner not be given the details of your procedure findings, then the procedure report has been included in a sealed envelope for you to review at your convenience later.  YOU SHOULD EXPECT: Some feelings of bloating in the abdomen. Passage of more gas than usual.  Walking can help get rid of the air that was put into your GI tract during the procedure and reduce the bloating. If you had a lower endoscopy (such as a colonoscopy or flexible sigmoidoscopy) you may notice spotting of blood in your stool or on the toilet paper. If you underwent a bowel prep for your procedure, you may not have a normal bowel movement for a few days.  Please Note:  You might notice some irritation and congestion in your nose or some drainage.  This is from the oxygen used during your procedure.  There is no need for concern and it should clear up in a day or so.  SYMPTOMS TO REPORT IMMEDIATELY:  Following upper endoscopy (EGD)  Vomiting of blood or coffee ground material  New chest pain or pain under the shoulder blades  Painful or persistently difficult swallowing  New shortness of breath  Fever of 100F or higher  Black, tarry-looking stools  Resume previous diet Continue present medications including Protonix  - 40 mg by mouth daily Await pathology results Avoid nonsteroidals   For urgent or emergent issues, a gastroenterologist can be reached at any hour by calling [phone]. Do not use MyChart messaging for urgent concerns.    DIET:  We do recommend a small meal at first, but then you may proceed to your regular diet.  Drink plenty of fluids but you should avoid alcoholic  beverages for 24 hours.  ACTIVITY:  You should plan to take it easy for the rest of today and you should NOT DRIVE or use heavy machinery until tomorrow (because of the sedation medicines used during the test).    FOLLOW UP: Our staff will call the number listed on your records the next business day following your procedure.  We will call around 7:15- 8:00 am to check on you and address any questions or concerns that you may have regarding the information given to you following your procedure. If we do not reach you, we will leave a message.     If any biopsies were taken you will be contacted by phone or by letter within the next 1-3 weeks.  Please call us  at [phone] if you have not heard about the biopsies in 3 weeks.    SIGNATURES/CONFIDENTIALITY: You and/or your care partner have signed paperwork which will be entered into your electronic medical record.  These signatures attest to the fact that that the information above on your After Visit Summary has been reviewed and is understood.  Full responsibility of the confidentiality of this discharge information lies with you and/or your care-partner.

## 2024-01-02 NOTE — Progress Notes (Signed)
 10/30/2023 Brandy Cruz [id number] 1951-01-06   Referring provider: Ina Marcellus RAMAN, MD Primary GI doctor: Dr. Charlanne   ASSESSMENT AND PLAN:  Epigastric pain 3-4 weeks ago, burning pain, no radiation Denies Nausea, vomiting, melena, dysphagia She does have bloating, no early satiety  Had increase stress, weight gain and poor eating while husband in hospital for sepsis/valve vegetation for 30 days Status post lap cholecystectomy  09/2020 EGD unremarkable other than small [facility] 03/13/2022 ABUS mild hepatic steatosis, 2.7 cm left liver cyst previous cholecystectomy normal bile ducts On pantoprazole  40 mg as needed, has been on once daily for 2 weeks, carafate  twice a day since that time Intermittent epigastric pain likely due to gastritis and hiatal hernia. Pain improved with pantoprazole  and Carafate . Stress, diet, and weight gain may contribute. No alarm symptoms. Differential includes bile reflux postcholecystectomy. - Continue pantoprazole  daily for 6-8 weeks. - Discontinue Carafate . - Check liver function tests, CBC, and lipase. - Advise dietary modifications and weight management. - Consider repeat EGD if symptoms persist.   Diarrhea 05/2020 colonoscopy moderate sigmoid diverticulosis nonbleeding IH, no recall unless issues Once or twice a day in the morning, occ yellow, can skip a day Loose stools and bloating possibly due to bile reflux or SIBO. 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 SIBO testing and treatment discussed but deferred. - Provide information on SIBO and potential testing/treatment options. - Advise on low-fat diet to manage bile reflux. - Recommend fiber supplementation to improve stool consistency.   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     Latest Ref Rng & Units  03/02/2022    4:34 PM 03/11/2020    9:00 AM 02/24/2018   11:06 AM  Hepatic Function  Total Protein 6.0 - 8.3 g/dL 6.9  6.9  7.0   Albumin 3.5 - 5.2 g/dL 4.0  4.3  4.0   AST 0 - 37 U/L 16  21  19    ALT 0 - 35 U/L 15  19  20    Alk Phosphatase 39 - 117 U/L 66  76  68   Total Bilirubin 0.2 - 1.2 mg/dL 0.5  0.7  0.8   Bilirubin, Direct 0.00 - 0.40 mg/dL   9.78      Platelets 686.9  - Check liver enzymes - If enzymes elevated, may consider additional serologic work-up to rule out concomitant liver disease - Depending on above evaluation and FIB-4, can consider ultrasound elastography versus fibrosure to follow-up  - need LFTs and CBC monitored every 6 months, - evaluation with imaging every 2-3 years.  - Encouraged diet/exercise for modest 10% body weight loss as treatment for hepatic steatosis -Continue to work on risk factor modification including diet exercise and control of risk factors including blood sugars.     Patient Care Team: Ina Marcellus RAMAN, MD as PCP - General (Family Medicine)   HISTORY OF PRESENT ILLNESS: 73 y.o. female with a past medical history listed below presents for evaluation of epigastric pain.    Patient last seen in the office 03/02/2022 by Dr. Charlanne for epigastric pain   Discussed the use of AI scribe software for clinical note transcription with the patient, who gave verbal consent to proceed.   History of Present Illness   Brandy Cruz is a 73 year old female with a history of hiatal hernia and fatty liver who presents with abdominal pain  and bloating.   She experiences abdominal pain primarily in the epigastric region, described as a burning sensation. A small hiatal hernia was identified during an EGD in 2022, which was otherwise normal. An ultrasound also showed fatty liver and a small cyst. She recalls being told she had gastritis and was prescribed sucralfate , which she took for a while and then discontinued. Recently, she experienced severe abdominal pain  about three to four weeks ago, prompting her to restart sucralfate , which has improved her symptoms.   She has been taking pantoprazole  intermittently, but recently increased to once daily for the past two weeks. No trouble swallowing, nausea, vomiting, or black stools. She reports loose stools almost daily, sometimes feeling bloated, but does not get full easily. She denies any radiation of pain to the back.   Her husband was recently hospitalized for 27 days, during which time she did not eat properly and gained weight. She attributes some of her dietary changes and stress to this period. She does not consume alcohol or use tobacco, and she only takes Tylenol  for pain due to her blood pressure medication.   She has been experiencing loose bowel movements almost daily, with stools sometimes appearing yellowish. She typically has a bowel movement once a day, occasionally twice, and they are not always loose. She has not started any new medications or supplements recently.           She  reports that she has never smoked. She has never used smokeless tobacco. She reports that she does not currently use alcohol. She reports that she does not use drugs.   RELEVANT GI HISTORY, IMAGING AND LABS: Results   RADIOLOGY Abdominal ultrasound: Fatty liver, small cyst, otherwise normal   DIAGNOSTIC EGD: Small hiatal hernia, otherwise normal (2022)     EGD 10/21/2020 - Small hiatal hernia. - Gastritis. Bx-negative for H. pylori - A single gastric polyp. Resected and retrieved. Bx-fundic gland polyp. - Normal examined duodenum. Bx-negative for celiac disease   Colonoscopy 05/2020 - Moderate sigmoid diverticulosis. - Non-bleeding internal hemorrhoids. - The examined portion of the ileum was normal. - The examination was otherwise normal on direct and retroflexion views. - No need to repeat unless problems.   CT AP 04/2020 1. Findings suggestive of acute sigmoid diverticulitis. Given the slightly  asymmetric appearance of colonic wall thickening at this location, underlying colonic malignancy is not excluded. Recommend colonoscopy following resolution of patient's acute symptoms. 2. Circumferential rectal wall thickening, which may represent proctitis. 3. Aortic atherosclerosis (ICD10-I70.0). CBC Labs (Brief)          Component Value Date/Time    WBC 7.9 03/02/2022 1634    RBC 3.97 03/02/2022 1634    HGB 12.9 03/02/2022 1634    HCT 37.4 03/02/2022 1634    PLT 313.0 03/02/2022 1634    MCV 94.2 03/02/2022 1634    MCH 33.2 10/03/2021 0238    MCHC 34.4 03/02/2022 1634    RDW 14.4 03/02/2022 1634    LYMPHSABS 3.0 03/02/2022 1634    MONOABS 0.6 03/02/2022 1634    EOSABS 0.3 03/02/2022 1634    BASOSABS 0.1 03/02/2022 1634      Recent Labs (within last 365 days)  No results for input(s): HGB in the last 8760 hours.     CMP     Labs (Brief)          Component Value Date/Time    NA 137 03/02/2022 1634    NA 139 06/07/2021 1159    K  3.1 (L) 03/02/2022 1634    CL 98 03/02/2022 1634    CO2 31 03/02/2022 1634    GLUCOSE 105 (H) 03/02/2022 1634    BUN 9 03/02/2022 1634    BUN 9 06/07/2021 1159    CREATININE 0.64 03/02/2022 1634    CALCIUM  9.2 03/02/2022 1634    PROT 6.9 03/02/2022 1634    PROT 6.9 03/11/2020 0900    ALBUMIN 4.0 03/02/2022 1634    ALBUMIN 4.3 03/11/2020 0900    AST 16 03/02/2022 1634    ALT 15 03/02/2022 1634    ALKPHOS 66 03/02/2022 1634    BILITOT 0.5 03/02/2022 1634    BILITOT 0.7 03/11/2020 0900    GFRNONAA >60 10/03/2021 0238    GFRAA 104 02/29/2020 0953          Latest Ref Rng & Units 03/02/2022    4:34 PM 03/11/2020    9:00 AM 02/24/2018   11:06 AM  Hepatic Function  Total Protein 6.0 - 8.3 g/dL 6.9  6.9  7.0   Albumin 3.5 - 5.2 g/dL 4.0  4.3  4.0   AST 0 - 37 U/L 16  21  19    ALT 0 - 35 U/L 15  19  20    Alk Phosphatase 39 - 117 U/L 66  76  68   Total Bilirubin 0.2 - 1.2 mg/dL 0.5  0.7  0.8   Bilirubin, Direct 0.00 - 0.40 mg/dL    9.78         Current Medications:      Current Outpatient Medications (Cardiovascular):    amLODipine  (NORVASC ) 5 MG tablet, Take 5 mg by mouth daily.   valsartan -hydrochlorothiazide  (DIOVAN -HCT) 320-12.5 MG per tablet, Take 1 tablet by mouth every morning.     Current Outpatient Medications (Analgesics):    acetaminophen  (TYLENOL ) 500 MG tablet, Take 500 mg by mouth every 8 (eight) hours as needed for moderate pain (pain score 4-6).   allopurinol  (ZYLOPRIM ) 100 MG tablet, Take 100 mg by mouth daily.     Current Outpatient Medications (Other):    pantoprazole  (PROTONIX ) 40 MG tablet, TAKE 1 TABLET BY MOUTH EVERY DAY   potassium chloride  SA (KLOR-CON  M) 20 MEQ tablet, Take 1 tablet (20 mEq total) by mouth daily. Additional refills will need to come from your Primary Care Provider. No more refills given by our office   sucralfate  (CARAFATE ) 1 g tablet, Take 1 g by mouth 4 (four) times daily.   venlafaxine  XR (EFFEXOR -XR) 37.5 MG 24 hr capsule, Take 1 capsule by mouth daily.   VITAMIN D, CHOLECALCIFEROL, PO, Take 1 tablet by mouth 2 (two) times a week.   Vitamin D, Ergocalciferol, (DRISDOL) 1.25 MG (50000 UNIT) CAPS capsule, Take 50,000 Units by mouth every 7 (seven) days.   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  Tolerated Cephalosporin Date: 10/03/21.            Surgical History:  She  has a past surgical history that includes Cholecystectomy; Appendectomy; Total knee arthroplasty (Left, 10/18/2014); Total hip arthroplasty (Left, 03/05/2018); Esophagogastroduodenoscopy (2001); Colonoscopy (07/19/2010); Dilation and curettage of uterus; Cataract extraction w/ intraocular lens implant (Bilateral); and Total hip arthroplasty (Right, 10/02/2021). Family History:  Her family history includes Arthritis in her mother; Prostate cancer in her father.   REVIEW OF SYSTEMS  : All other systems reviewed and negative except where noted in the History of Present Illness.   PHYSICAL EXAM: BP 138/78 (BP Location: Left Arm, Patient Position: Sitting, Cuff Size: Normal)   Pulse 78   Ht 5' 4 (1.626 m)   Wt 165 lb 6 oz (75 kg)   BMI 28.39 kg/m  Physical Exam   GENERAL APPEARANCE: Well nourished, in no apparent distress. HEENT: No cervical lymphadenopathy, unremarkable thyroid , sclerae anicteric, conjunctiva pink. RESPIRATORY: Respiratory effort normal, breath sounds equal bilaterally without rales, rhonchi, or wheezing. Lungs clear to auscultation bilaterally. CARDIO: Regular rate and rhythm with no murmurs, rubs, or gallops, peripheral pulses intact. ABDOMEN: Soft, non-distended, active bowel sounds in all four quadrants, non-tender to palpation, no rebound tenderness, no masses appreciated. RECTAL: Declines. MUSCULOSKELETAL: Full range of motion, normal gait, without edema. SKIN: Dry, intact without rashes or lesions. No jaundice. NEURO: Alert, oriented, no focal deficits. PSYCH: Cooperative, normal mood and affect.       Alan JONELLE Coombs, PA-C     Attending physician's note   I have taken history, reviewed the chart and examined the patient. I performed a substantive portion of this encounter, including complete performance of at  least one of the key components, in conjunction with the APP. I agree with the Advanced Practitioner's note, impression and recommendations.    Anselm Bring, MD [facility] [phone]

## 2024-01-02 NOTE — Progress Notes (Signed)
 VS by CL

## 2024-01-02 NOTE — Progress Notes (Signed)
 Report given to PACU, vss

## 2024-01-02 NOTE — Progress Notes (Signed)
 1520 Robinul 0.1 mg IV given due large amount of secretions upon assessment.  MD made aware, vss  Simethicone 133 mg per 2 cc given with 10 cc of H20 per Dr Charlanne request.

## 2024-01-03 ENCOUNTER — Telehealth: Payer: Self-pay

## 2024-01-03 NOTE — Telephone Encounter (Signed)
  Follow up Call-     01/02/2024    3:11 PM  Call back number  Post procedure Call Back phone  # [phone]  Permission to leave phone message Yes     Patient questions:  Do you have a fever, pain , or abdominal swelling? No. Pain Score  0 *  Have you tolerated food without any problems? Yes.    Have you been able to return to your normal activities? Yes.    Do you have any questions about your discharge instructions: Diet   No. Medications  No. Follow up visit  No.  Do you have questions or concerns about your Care? No.  Actions: * If pain score is 4 or above: No action needed, pain <4.

## 2024-01-06 DIAGNOSIS — E538 Deficiency of other specified B group vitamins: Secondary | ICD-10-CM | POA: Diagnosis not present

## 2024-01-07 LAB — SURGICAL PATHOLOGY

## 2024-01-08 ENCOUNTER — Ambulatory Visit: Admitting: Physician Assistant

## 2024-01-11 ENCOUNTER — Ambulatory Visit: Payer: Self-pay | Admitting: Gastroenterology

## 2024-01-30 DIAGNOSIS — Z6828 Body mass index (BMI) 28.0-28.9, adult: Secondary | ICD-10-CM | POA: Diagnosis not present

## 2024-01-30 DIAGNOSIS — M5416 Radiculopathy, lumbar region: Secondary | ICD-10-CM | POA: Diagnosis not present

## 2024-02-05 ENCOUNTER — Other Ambulatory Visit

## 2024-02-14 ENCOUNTER — Other Ambulatory Visit (HOSPITAL_BASED_OUTPATIENT_CLINIC_OR_DEPARTMENT_OTHER): Payer: Self-pay

## 2024-02-14 ENCOUNTER — Encounter (HOSPITAL_BASED_OUTPATIENT_CLINIC_OR_DEPARTMENT_OTHER): Payer: Self-pay

## 2024-02-14 ENCOUNTER — Ambulatory Visit (HOSPITAL_BASED_OUTPATIENT_CLINIC_OR_DEPARTMENT_OTHER)
Admission: RE | Admit: 2024-02-14 | Discharge: 2024-02-14 | Disposition: A | Discharge status: Home / Self Care | Attending: Family Medicine | Admitting: Family Medicine

## 2024-02-14 VITALS — BP 145/89 | HR 100 | Temp 99.5°F | Resp 20

## 2024-02-14 DIAGNOSIS — R051 Acute cough: Secondary | ICD-10-CM | POA: Diagnosis not present

## 2024-02-14 LAB — POC COVID19/FLU A&B COMBO
Covid Antigen, POC: NEGATIVE
Influenza A Antigen, POC: NEGATIVE
Influenza B Antigen, POC: NEGATIVE

## 2024-02-14 MED ORDER — PROMETHAZINE-DM 6.25-15 MG/5ML PO SYRP
5.0000 mL | ORAL_SOLUTION | Freq: Four times a day (QID) | ORAL | 0 refills | Status: DC | PRN
  Filled 2024-02-14: qty 118, 6d supply, fill #0

## 2024-02-14 MED ORDER — AZITHROMYCIN 250 MG PO TABS
ORAL_TABLET | ORAL | 0 refills | Status: AC
  Filled 2024-02-14: qty 6, 5d supply, fill #0

## 2024-02-14 NOTE — Discharge Instructions (Signed)
 Treating you for an upper respiratory infection. Take the medications as prescribed. You can continue OTC medications as needed.

## 2024-02-14 NOTE — ED Provider Notes (Signed)
 " [facility]    CSN: [id number] Arrival date & time: 02/14/24  1451      History   Chief Complaint Chief Complaint  Patient presents with   Cough    Congestion and cough - Entered by patient   sinus congestion    HPI Brandy Cruz is a 73 y.o. female.   Sinus congestion , headache onset Wednesday. Cough onset last night. States cough is deep. Unsure if fever present. Took tylenol  cold and flu this morning. Cough is dry. Chest soreness with cough.    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   Hyperlipidemia    Hypertension    Localized swelling of both lower legs 06/07/2021   OA (osteoarthritis) of hip 08/09/2021   OA (osteoarthritis) of knee 10/18/2014   Osteoarthritis of right hip 10/02/2021   Osteoarthritis of right knee 10/18/2014   Osteopenia    Pain in joint of left shoulder 03/09/2021   Pain of left hip joint 06/11/2017    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 Internal hemorrhoids. Otherwise normal colonoscopy to terminal ileum   DILATION AND CURETTAGE OF UTERUS     ESOPHAGOGASTRODUODENOSCOPY  2001   TOTAL HIP ARTHROPLASTY Left 03/05/2018   Procedure: TOTAL HIP ARTHROPLASTY ANTERIOR APPROACH;  Surgeon: Melodi Lerner, MD;  Location: [facility];  Service: Orthopedics;  Laterality: Left;   TOTAL HIP ARTHROPLASTY Right 10/02/2021   Procedure: TOTAL HIP ARTHROPLASTY ANTERIOR APPROACH;  Surgeon: Melodi Lerner, MD;  Location: [facility];  Service: Orthopedics;  Laterality: Right;   TOTAL KNEE ARTHROPLASTY Left 10/18/2014   Procedure: LEFT TOTAL KNEE ARTHROPLASTY;  Surgeon: Lerner Melodi, MD;  Location: [facility];  Service: Orthopedics;  Laterality: Left;    OB History   No obstetric history on file.      Home Medications    Prior to Admission medications  Medication Sig Start Date End Date Taking? Authorizing Provider  azithromycin (ZITHROMAX) 250 MG tablet Take 2 tablets (500 mg total) by mouth daily for 1 day, THEN 1 tablet (250 mg total) daily for  4 days. 02/14/24 02/19/24 Yes Ediel Unangst A, FNP  promethazine-dextromethorphan (PROMETHAZINE-DM) 6.25-15 MG/5ML syrup Take 5 mLs by mouth 4 (four) times daily as needed for cough. 02/14/24  Yes Calissa Swenor A, FNP  acetaminophen  (TYLENOL ) 500 MG tablet Take 500 mg by mouth every 8 (eight) hours as needed for moderate pain (pain score 4-6).    [provider]  allopurinol  (ZYLOPRIM ) 100 MG tablet Take 100 mg by mouth daily.    [provider]  amLODipine  (NORVASC ) 2.5 MG tablet Take 2.5 mg by mouth at bedtime.    [provider]  cholestyramine  (QUESTRAN ) 4 g packet Take 1 packet (4 g total) by mouth daily. Diarrhea/bile reflux Patient not taking:  Reported on 01/02/2024 11/29/23   Craig Alan SAUNDERS, PA-C  pantoprazole  (PROTONIX ) 40 MG tablet TAKE 1 TABLET BY MOUTH EVERY DAY 08/07/21   Charlanne Groom, MD  Potassium Chloride  ER 20 MEQ TBCR Take 1 tablet by mouth daily. 12/24/23   [provider]  sucralfate  (CARAFATE ) 1 g tablet Take 1 g by mouth 4 (four) times daily. 10/08/23   [provider]  valsartan -hydrochlorothiazide  (DIOVAN -HCT) 320-12.5 MG per tablet Take 1 tablet by mouth every morning. 09/05/14   [provider]  venlafaxine  XR (EFFEXOR -XR) 75 MG 24 hr capsule Take 75 mg by mouth daily.    [provider]  VITAMIN D, CHOLECALCIFEROL, PO Take 1 tablet by mouth 2 (two) times a week.    [provider]  Vitamin D, Ergocalciferol, (DRISDOL) 1.25 MG (50000 UNIT) CAPS capsule Take 50,000 Units by mouth every 7 (seven) days. 02/25/23   [provider]    Family History Family History  Problem Relation Age of Onset   Arthritis Mother    Prostate cancer Father    Colon cancer Neg Hx    Stomach cancer Neg Hx    Pancreatic cancer Neg Hx    Esophageal cancer Neg Hx    Liver disease Neg Hx    Rectal cancer Neg Hx     Social History Social History[1]   Allergies   Sulfa antibiotics, Levaquin [levofloxacin in d5w], and Penicillins   Review of Systems Review of Systems  Respiratory:  Positive for cough.      Physical Exam Triage Vital Signs ED Triage Vitals  Encounter Vitals Group     BP 02/14/24 1507 (!) 145/89     Girls Systolic BP Percentile --      Girls Diastolic BP Percentile --      Boys Systolic BP Percentile --      Boys Diastolic BP Percentile --      Pulse Rate 02/14/24 1507 100     Resp 02/14/24 1507 20     Temp 02/14/24 1507 99.5 F (37.5 C)     Temp Source 02/14/24 1507 Oral     SpO2 02/14/24 1507 95 %     Weight --      Height --      Head Circumference --      Peak Flow --      Pain Score 02/14/24 1510 4     Pain Loc --      Pain Education --       Exclude from Growth Chart --    No data found.  Updated Vital Signs BP (!) 145/89 (BP Location: Right Arm)   Pulse 100   Temp 99.5 F (37.5 C) (Oral)   Resp 20   SpO2 95%   Visual Acuity Right Eye Distance:  Left Eye Distance:   Bilateral Distance:    Right Eye Near:   Left Eye Near:    Bilateral Near:     Physical Exam Constitutional:      General: She is not in acute distress.    Appearance: Normal appearance. She is not ill-appearing, toxic-appearing or diaphoretic.  HENT:     Head: Normocephalic and atraumatic.     Right Ear: Tympanic membrane and ear canal normal.     Left Ear: Tympanic membrane and ear canal normal.     Nose: Congestion present.     Mouth/Throat:     Pharynx: Oropharynx is clear.  Eyes:     Conjunctiva/sclera: Conjunctivae normal.  Cardiovascular:     Rate and Rhythm: Normal rate and regular rhythm.     Pulses: Normal pulses.     Heart sounds: Normal heart sounds.  Pulmonary:     Effort: Pulmonary effort is normal.     Breath sounds: Normal breath sounds.  Skin:    General: Skin is warm and dry.  Neurological:     Mental Status: She is alert.  Psychiatric:        Mood and Affect: Mood normal.      UC Treatments / Results  Labs (all labs ordered are listed, but only abnormal results are displayed) Labs Reviewed  POC COVID19/FLU A&B COMBO - Normal    EKG   Radiology No results found.  Procedures Procedures (including critical care time)  Medications Ordered in UC Medications - No data to display  Initial Impression / Assessment and Plan / UC Course  I have reviewed the triage vital signs and the nursing notes.  Pertinent labs & imaging results that were available during my care of the patient were reviewed by me and considered in my medical decision making (see chart for details).     Cough- treating for bronchitis. Flu and covid negative.  Medications as prescribed.  Over-the-counter doses as needed.  Follow-up  as needed Final Clinical Impressions(s) / UC Diagnoses   Final diagnoses:  Acute cough     Discharge Instructions      Treating you for an upper respiratory infection. Take the medications as prescribed. You can continue OTC medications as needed.       ED Prescriptions     Medication Sig Dispense Auth. Provider   azithromycin (ZITHROMAX) 250 MG tablet Take 2 tablets (500 mg total) by mouth daily for 1 day, THEN 1 tablet (250 mg total) daily for 4 days. 6 tablet Jadyn Barge A, FNP   promethazine-dextromethorphan (PROMETHAZINE-DM) 6.25-15 MG/5ML syrup Take 5 mLs by mouth 4 (four) times daily as needed for cough. 118 mL Adah Corning A, FNP      PDMP not reviewed this encounter.     [1]  Social History Tobacco Use   Smoking status: Never   Smokeless tobacco: Never  Vaping Use   Vaping status: Never Used  Substance Use Topics   Alcohol use: Not Currently    Comment: occasionally   Drug use: No     Adah Corning LABOR, FNP 02/14/24 1715  "

## 2024-02-14 NOTE — ED Triage Notes (Signed)
 Sinus congestion , headache onset Wednesday. Cough onset last night. States cough is deep. Unsure if fever present. Took tylenol  cold and flu this morning. Cough is dry. Chest soreness with cough.

## 2024-03-17 NOTE — H&P (Signed)
 " TOTAL KNEE ADMISSION H&P  Patient is being admitted for right total knee arthroplasty.  Subjective:  Chief Complaint: Right knee pain.  HPI: Brandy Cruz, 74 y.o. female has a history of pain and functional disability in the right knee due to arthritis and has failed non-surgical conservative treatments for greater than 12 weeks to include NSAID's and/or analgesics, corticosteriod injections, viscosupplementation injections, flexibility and strengthening excercises, use of assistive devices, and activity modification. Onset of symptoms was gradual, starting several years ago with gradually worsening course since that time. The patient noted no past surgery on the right knee.  Patient currently rates pain in the right knee at 8 out of 10 with activity. Patient has night pain, worsening of pain with activity and weight bearing, pain that interferes with activities of daily living, crepitus, and joint swelling. Patient has evidence of severe bone-on-bone arthritis in the medial and patellofemoral compartments of the right knee with varus deformity and tibial subluxation.  by imaging studies. There is no active infection.  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   Pain of left hip joint 06/11/2017   Dyslipidemia 03/18/2017   Electrocardiogram abnormal 02/13/2017   OA (osteoarthritis) of knee 10/18/2014   Osteoarthritis of right knee 10/18/2014    Past Medical History:  Diagnosis Date   Anemia    hx of   Anxiety    hx of   Arthritis     CAP (community acquired pneumonia) 06/07/2021   Cataract    bilateral- lense implants   Depression    Dyslipidemia 03/18/2017   Electrocardiogram abnormal 02/13/2017   Gastritis    GERD (gastroesophageal reflux disease)    Hemorrhoids    History of total knee replacement, 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 Internal hemorrhoids. Otherwise normal colonoscopy to terminal ileum   DILATION AND CURETTAGE OF UTERUS     ESOPHAGOGASTRODUODENOSCOPY  2001   TOTAL HIP ARTHROPLASTY Left 03/05/2018   Procedure: TOTAL HIP ARTHROPLASTY ANTERIOR APPROACH;  Surgeon: Melodi Lerner, MD;  Location: [facility];  Service: Orthopedics;  Laterality: Left;   TOTAL HIP ARTHROPLASTY Right 10/02/2021   Procedure: TOTAL HIP ARTHROPLASTY ANTERIOR APPROACH;  Surgeon: Melodi Lerner, MD;  Location: [facility];  Service: Orthopedics;  Laterality: Right;   TOTAL KNEE ARTHROPLASTY Left 10/18/2014   Procedure: LEFT TOTAL KNEE ARTHROPLASTY;  Surgeon:  Dempsey Moan, MD;  Location: [facility];  Service: Orthopedics;  Laterality: Left;    Prior to Admission medications  Medication Sig Start Date End Date Taking? Authorizing Provider  acetaminophen  (TYLENOL ) 500 MG tablet Take 500 mg by mouth every 8 (eight) hours as needed for moderate pain (pain score 4-6).    [provider]  allopurinol  (ZYLOPRIM ) 100 MG tablet Take 100 mg by mouth daily.    [provider]   amLODipine  (NORVASC ) 2.5 MG tablet Take 2.5 mg by mouth at bedtime.    [provider]  cholestyramine  (QUESTRAN ) 4 g packet Take 1 packet (4 g total) by mouth daily. Diarrhea/bile reflux Patient not taking: Reported on 01/02/2024 11/29/23   Craig Alan SAUNDERS, PA-C  pantoprazole  (PROTONIX ) 40 MG tablet TAKE 1 TABLET BY MOUTH EVERY DAY 08/07/21   Charlanne Groom, MD  Potassium Chloride  ER 20 MEQ TBCR Take 1 tablet by mouth daily. 12/24/23   [provider]  promethazine -dextromethorphan (PROMETHAZINE -DM) 6.25-15 MG/5ML syrup Take 5 mLs by mouth 4 (four) times daily as needed for cough. 02/14/24   Adah Corning A, FNP  sucralfate  (CARAFATE ) 1 g tablet Take 1 g by mouth 4 (four) times daily. 10/08/23   [provider]  valsartan -hydrochlorothiazide  (DIOVAN -HCT) 320-12.5 MG per tablet Take 1 tablet by mouth every morning. 09/05/14   [provider]  venlafaxine  XR (EFFEXOR -XR) 75 MG 24 hr capsule Take 75 mg by mouth daily.    [provider]  VITAMIN D, CHOLECALCIFEROL, PO Take 1 tablet by mouth 2 (two) times a week.    [provider]  Vitamin D, Ergocalciferol, (DRISDOL) 1.25 MG (50000 UNIT) CAPS capsule Take 50,000 Units by mouth every 7 (seven) days. 02/25/23   [provider]    Allergies[1]  Social History   Socioeconomic History   Marital status: Married    Spouse name: Not on file   Number of children: 3   Years of education: Not on file   Highest education level: Not on file  Occupational History   Occupation: Retired  Tobacco Use   Smoking status: Never   Smokeless tobacco: Never  Vaping Use   Vaping status: Never Used  Substance and Sexual Activity   Alcohol use: Not Currently    Comment: occasionally   Drug use: No   Sexual activity: Not Currently  Other Topics Concern   Not on file  Social History Narrative   Not on file   Social Drivers of Health   Tobacco Use: Low Risk (02/14/2024)   Patient History     Smoking Tobacco Use: Never    Smokeless Tobacco Use: Never    Passive Exposure: Not on file  Financial Resource Strain: Not on file  Food Insecurity: Low Risk (03/05/2023)   Received from [facility]   Epic    Within the past 12 months, you worried that your food would run out before you got money to buy more: Never true    Within the past 12 months, the food you bought just didn't last and you didn't have money to get more. : Never true  Transportation Needs: No Transportation Needs (03/05/2023)   Received from [facility]    In the past 12 months, has lack of reliable transportation kept you from medical appointments, meetings, work or from getting things needed for daily living? : No  Physical Activity: Not on file  Stress: Not on file  Social Connections: Not on file  Intimate Partner Violence:  Not on file  Depression (EYV7-0): Not on file  Alcohol Screen: Not on file  Housing: Low Risk (03/05/2023)   Received from [facility]   Epic    What is your living situation today?: I have a steady place to live    Think about the place you live. Do you have problems with any of the following? Choose all that apply:: None/None on this list  Utilities: Low Risk (03/05/2023)   Received from [facility]   Utilities    In the past 12 months has the electric, gas, oil, or water  company threatened to shut off services in your home? : No  Health Literacy: Not on file    Tobacco Use: Low Risk (02/14/2024)   Patient History    Smoking Tobacco Use: Never    Smokeless Tobacco Use: Never    Passive Exposure: Not on file   Social History   Substance and Sexual Activity  Alcohol Use Not Currently   Comment: occasionally    Family History  Problem Relation Age of Onset   Arthritis Mother    Prostate cancer Father    Colon cancer Neg Hx    Stomach cancer Neg Hx    Pancreatic cancer Neg Hx    Esophageal cancer Neg Hx    Liver disease Neg Hx    Rectal cancer Neg Hx      Review of Systems  Constitutional:  Negative for chills and fever.  Respiratory:  Negative for cough.   Cardiovascular:  Negative for chest pain.  Gastrointestinal:  Negative for abdominal pain.  Genitourinary:  Negative for dysuria.  Musculoskeletal:  Positive for joint pain.    Objective:  Physical Exam: - Well-developed female alert and oriented in no apparent distress - Evaluation of the right knee demonstrates no effusion. - Significant varus deformity is present. - Range of motion is approximately 5-120 with crepitus noted during movement. - Tenderness is noted medially, with no lateral tenderness or instability. - Significant antalgic gait pattern observed on the right.  Imaging Review Plain radiographs demonstrate severe bone-on-bone arthritis in the medial and patellofemoral compartments of the right knee with varus deformity and tibial subluxation.   Assessment/Plan:  End stage arthritis, right knee   The patient history, physical examination, clinical judgment of the provider and imaging studies are consistent with end stage degenerative joint disease of the right knee and total knee arthroplasty is deemed medically necessary. The treatment options including medical management, injection therapy arthroscopy and arthroplasty were discussed at length. The risks and benefits of total knee arthroplasty were presented and reviewed. The risks due to aseptic loosening, infection, stiffness, patella tracking problems, thromboembolic complications and other imponderables were discussed. The patient acknowledged the explanation, agreed to proceed with the plan and consent was signed. Patient is being admitted for inpatient treatment for surgery, pain control, PT, OT, prophylactic antibiotics, VTE prophylaxis, progressive ambulation and ADLs and discharge planning. The patient is planning to be discharged home.   Patient's anticipated LOS is less than 2 midnights, meeting these  requirements: - Lives within 1 hour of care - Has a competent adult at home to recover with post-op recover - NO history of  - Chronic pain requiring opiods  - Diabetes  - Coronary Artery Disease  - Heart failure  - Heart attack  - Stroke  - DVT/VTE  - Cardiac arrhythmia  - Respiratory Failure/COPD  - Renal failure  - Advanced Liver disease  Therapy Plans: [facility]  Disposition: Home with  husband  Planned DVT Prophylaxis: Xarelto  10mg  QD (hx of gastritis with ASA/NSAIDs) DME Needed: none  PCP: Marcellus Baptist, MD (clearance received)  TXA: IV  Allergies: Levofloxacin (HA), Penicillins (rash, childhood rxn), Sulfa (rash)  Metal Allergies: none Anesthesia Concerns: none BMI: 29.8  Last HgbA1c: not diabetic Pharmacy: [facility] to bring on DC  Pain regimen: Oxycodone , Methocarbamol   Other: - can not tolerate NSAIDs/ASA - has taken tramadol  with her venlafaxine , patient reports that it caused her to feel bad and she wants to avoid tramadol .   - Patient was instructed on what medications to stop prior to surgery. - Follow-up visit in 2 weeks with Dr. Melodi - Begin physical therapy following surgery - Pre-operative lab work as pre-surgical testing - Prescriptions will be provided in hospital at time of discharge  Waddell Sor, PA-C Orthopedic Surgery [facility] [place]      [1]  Allergies Allergen Reactions   Sulfa Antibiotics Hives and Itching    + fever   Levaquin [Levofloxacin In D5w]     Dizziness and headache   Penicillins Rash    Childhood allergy.  Tolerated Cephalosporin Date: 10/03/21.     "

## 2024-03-25 ENCOUNTER — Other Ambulatory Visit: Payer: Self-pay | Admitting: Physician Assistant

## 2024-03-31 NOTE — Progress Notes (Signed)
 Date of COVID positive in last 90 days:  PCP - Marcellus Baptist, MD Cardiologist - Lamar Fitch, MD LOV 11/01/23  Clearance in media tab dated 03/06/24  Chest x-ray - 03/07/23 [facility] Cardiac CT- 03/07/20 EKG - 11/01/23 Epic Stress Test - 03/28/17 Epic ECHO - 02/28/17 Epic Cardiac Cath - N/A Pacemaker/ICD device last checked:N/A Spinal Cord Stimulator:N/A  Bowel Prep - N/A  Sleep Study - N/A CPAP -   Fasting Blood Sugar - N/A Checks Blood Sugar _____ times a day  Last dose of GLP1 agonist-  N/A GLP1 instructions:  Do not take after     Last dose of SGLT-2 inhibitors-  N/A SGLT-2 instructions:  Do not take after     Blood Thinner Instructions: N/A Last dose:   Time: Aspirin Instructions:N/A Last Dose:  Activity level: Can go up a flight of stairs and perform activities of daily living without stopping and without symptoms of chest pain or shortness of breath.   Anesthesia review: N/A  Patient denies shortness of breath, fever, cough and chest pain at PAT appointment  Patient verbalized understanding of instructions that were given to them at the PAT appointment. Patient was also instructed that they will need to review over the PAT instructions again at home before surgery.

## 2024-04-01 ENCOUNTER — Encounter (HOSPITAL_COMMUNITY): Admission: RE | Admit: 2024-04-01 | Discharge: 2024-04-01 | Attending: Orthopedic Surgery

## 2024-04-01 ENCOUNTER — Encounter (HOSPITAL_COMMUNITY): Payer: Self-pay

## 2024-04-01 ENCOUNTER — Other Ambulatory Visit: Payer: Self-pay

## 2024-04-01 VITALS — BP 158/79 | HR 79 | Temp 98.0°F | Resp 16 | Ht 64.0 in | Wt 164.0 lb

## 2024-04-01 DIAGNOSIS — Z01812 Encounter for preprocedural laboratory examination: Secondary | ICD-10-CM | POA: Insufficient documentation

## 2024-04-01 DIAGNOSIS — I10 Essential (primary) hypertension: Secondary | ICD-10-CM | POA: Insufficient documentation

## 2024-04-01 DIAGNOSIS — Z01818 Encounter for other preprocedural examination: Secondary | ICD-10-CM

## 2024-04-01 HISTORY — DX: Malignant (primary) neoplasm, unspecified: C80.1

## 2024-04-01 LAB — CBC
HCT: 42.3 % (ref 36.0–46.0)
Hemoglobin: 14.1 g/dL (ref 12.0–15.0)
MCH: 33.5 pg (ref 26.0–34.0)
MCHC: 33.3 g/dL (ref 30.0–36.0)
MCV: 100.5 fL — ABNORMAL HIGH (ref 80.0–100.0)
Platelets: 325 10*3/uL (ref 150–400)
RBC: 4.21 MIL/uL (ref 3.87–5.11)
RDW: 12.4 % (ref 11.5–15.5)
WBC: 8.3 10*3/uL (ref 4.0–10.5)
nRBC: 0 % (ref 0.0–0.2)

## 2024-04-01 LAB — BASIC METABOLIC PANEL WITH GFR
Anion gap: 10 (ref 5–15)
BUN: 12 mg/dL (ref 8–23)
CO2: 29 mmol/L (ref 22–32)
Calcium: 9.3 mg/dL (ref 8.9–10.3)
Chloride: 98 mmol/L (ref 98–111)
Creatinine, Ser: 0.78 mg/dL (ref 0.44–1.00)
GFR, Estimated: >60 mL/min
Glucose, Bld: 118 mg/dL — ABNORMAL HIGH (ref 70–99)
Potassium: 3.8 mmol/L (ref 3.5–5.1)
Sodium: 138 mmol/L (ref 135–145)

## 2024-04-01 LAB — SURGICAL PCR SCREEN
MRSA, PCR: NEGATIVE
Staphylococcus aureus: NEGATIVE

## 2024-04-13 ENCOUNTER — Encounter (HOSPITAL_COMMUNITY): Admission: RE | Payer: Self-pay | Source: Ambulatory Visit

## 2024-04-13 ENCOUNTER — Ambulatory Visit (HOSPITAL_COMMUNITY): Admit: 2024-04-13 | Admitting: Orthopedic Surgery

## 2024-04-13 SURGERY — ARTHROPLASTY, KNEE, TOTAL
Anesthesia: Choice | Site: Knee | Laterality: Right
